# Patient Record
Sex: Male | Born: 1965 | Race: White | Hispanic: No | Smoking: Current every day smoker
Health system: Southern US, Community
[De-identification: ages and names within clinical notes are randomized; demographics above are authoritative.]

## PROBLEM LIST (undated history)

## (undated) DIAGNOSIS — J449 Chronic obstructive pulmonary disease, unspecified: Secondary | ICD-10-CM

## (undated) DIAGNOSIS — M069 Rheumatoid arthritis, unspecified: Secondary | ICD-10-CM

## (undated) DIAGNOSIS — I252 Old myocardial infarction: Secondary | ICD-10-CM

## (undated) DIAGNOSIS — D649 Anemia, unspecified: Secondary | ICD-10-CM

## (undated) DIAGNOSIS — F329 Major depressive disorder, single episode, unspecified: Secondary | ICD-10-CM

## (undated) DIAGNOSIS — E785 Hyperlipidemia, unspecified: Secondary | ICD-10-CM

## (undated) DIAGNOSIS — I10 Essential (primary) hypertension: Secondary | ICD-10-CM

## (undated) DIAGNOSIS — C3490 Malignant neoplasm of unspecified part of unspecified bronchus or lung: Secondary | ICD-10-CM

## (undated) DIAGNOSIS — Z85118 Personal history of other malignant neoplasm of bronchus and lung: Secondary | ICD-10-CM

## (undated) DIAGNOSIS — Z8673 Personal history of transient ischemic attack (TIA), and cerebral infarction without residual deficits: Secondary | ICD-10-CM

## (undated) DIAGNOSIS — F32A Depression, unspecified: Secondary | ICD-10-CM

## (undated) DIAGNOSIS — K5090 Crohn's disease, unspecified, without complications: Secondary | ICD-10-CM

## (undated) DIAGNOSIS — I639 Cerebral infarction, unspecified: Secondary | ICD-10-CM

## (undated) DIAGNOSIS — I110 Hypertensive heart disease with heart failure: Secondary | ICD-10-CM

## (undated) DIAGNOSIS — F99 Mental disorder, not otherwise specified: Secondary | ICD-10-CM

## (undated) DIAGNOSIS — G4730 Sleep apnea, unspecified: Secondary | ICD-10-CM

## (undated) DIAGNOSIS — F419 Anxiety disorder, unspecified: Secondary | ICD-10-CM

## (undated) DIAGNOSIS — R569 Unspecified convulsions: Secondary | ICD-10-CM

## (undated) DIAGNOSIS — K219 Gastro-esophageal reflux disease without esophagitis: Secondary | ICD-10-CM

## (undated) DIAGNOSIS — Z9981 Dependence on supplemental oxygen: Secondary | ICD-10-CM

## (undated) DIAGNOSIS — E291 Testicular hypofunction: Secondary | ICD-10-CM

## (undated) DIAGNOSIS — R413 Other amnesia: Secondary | ICD-10-CM

## (undated) DIAGNOSIS — G629 Polyneuropathy, unspecified: Secondary | ICD-10-CM

## (undated) DIAGNOSIS — N183 Chronic kidney disease, stage 3 unspecified: Secondary | ICD-10-CM

## (undated) HISTORY — DX: Other amnesia: R41.3

## (undated) HISTORY — PX: OTHER SURGICAL HISTORY: SHX169

## (undated) HISTORY — DX: Hyperlipidemia, unspecified: E78.5

## (undated) HISTORY — PX: EYE SURGERY: SHX253

## (undated) HISTORY — DX: Mental disorder, not otherwise specified: F99

## (undated) HISTORY — DX: Personal history of other malignant neoplasm of bronchus and lung: Z85.118

## (undated) HISTORY — DX: Anxiety disorder, unspecified: F41.9

## (undated) HISTORY — DX: Depression, unspecified: F32.A

## (undated) HISTORY — DX: Unspecified convulsions: R56.9

## (undated) HISTORY — DX: Personal history of transient ischemic attack (TIA), and cerebral infarction without residual deficits: Z86.73

## (undated) HISTORY — DX: Crohn's disease, unspecified, without complications: K50.90

## (undated) HISTORY — DX: Chronic kidney disease, stage 3 unspecified: N18.30

## (undated) HISTORY — DX: Testicular hypofunction: E29.1

## (undated) HISTORY — PX: APPENDECTOMY: SHX54

## (undated) HISTORY — DX: Chronic obstructive pulmonary disease, unspecified: J44.9

## (undated) HISTORY — DX: Major depressive disorder, single episode, unspecified: F32.9

## (undated) HISTORY — DX: Hypertensive heart disease with heart failure: I11.0

## (undated) HISTORY — DX: Malignant neoplasm of unspecified part of unspecified bronchus or lung: C34.90

## (undated) HISTORY — PX: PORT-A-CATH REMOVAL: SHX5289

## (undated) HISTORY — DX: Rheumatoid arthritis, unspecified: M06.9

## (undated) HISTORY — DX: Essential (primary) hypertension: I10

## (undated) HISTORY — DX: Anemia, unspecified: D64.9

## (undated) HISTORY — DX: Cerebral infarction, unspecified: I63.9

## (undated) HISTORY — DX: Dependence on supplemental oxygen: Z99.81

## (undated) HISTORY — DX: Polyneuropathy, unspecified: G62.9

## (undated) HISTORY — DX: Gastro-esophageal reflux disease without esophagitis: K21.9

---
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
## 2007-09-28 HISTORY — PX: ABDOMINAL SURGERY: SHX537

## 2008-08-11 DIAGNOSIS — K5090 Crohn's disease, unspecified, without complications: Secondary | ICD-10-CM | POA: Insufficient documentation

## 2011-09-28 HISTORY — PX: APPENDECTOMY: SHX54

## 2011-10-01 DIAGNOSIS — K5090 Crohn's disease, unspecified, without complications: Secondary | ICD-10-CM | POA: Diagnosis not present

## 2011-10-01 DIAGNOSIS — R05 Cough: Secondary | ICD-10-CM | POA: Diagnosis not present

## 2011-10-01 DIAGNOSIS — J4489 Other specified chronic obstructive pulmonary disease: Secondary | ICD-10-CM | POA: Diagnosis not present

## 2011-10-01 DIAGNOSIS — J449 Chronic obstructive pulmonary disease, unspecified: Secondary | ICD-10-CM | POA: Diagnosis not present

## 2011-10-01 DIAGNOSIS — Z79899 Other long term (current) drug therapy: Secondary | ICD-10-CM | POA: Diagnosis not present

## 2011-10-01 DIAGNOSIS — R0609 Other forms of dyspnea: Secondary | ICD-10-CM | POA: Diagnosis not present

## 2011-10-01 DIAGNOSIS — R059 Cough, unspecified: Secondary | ICD-10-CM | POA: Diagnosis not present

## 2011-10-01 DIAGNOSIS — G4730 Sleep apnea, unspecified: Secondary | ICD-10-CM | POA: Diagnosis not present

## 2011-10-01 DIAGNOSIS — G839 Paralytic syndrome, unspecified: Secondary | ICD-10-CM | POA: Diagnosis not present

## 2011-10-01 DIAGNOSIS — Z23 Encounter for immunization: Secondary | ICD-10-CM | POA: Diagnosis not present

## 2011-10-01 DIAGNOSIS — Z87891 Personal history of nicotine dependence: Secondary | ICD-10-CM | POA: Diagnosis not present

## 2011-11-08 DIAGNOSIS — G8929 Other chronic pain: Secondary | ICD-10-CM | POA: Diagnosis not present

## 2011-11-08 DIAGNOSIS — G894 Chronic pain syndrome: Secondary | ICD-10-CM | POA: Diagnosis not present

## 2011-11-08 DIAGNOSIS — M545 Low back pain, unspecified: Secondary | ICD-10-CM | POA: Diagnosis not present

## 2011-11-08 DIAGNOSIS — Z79899 Other long term (current) drug therapy: Secondary | ICD-10-CM | POA: Diagnosis not present

## 2011-11-08 DIAGNOSIS — K5090 Crohn's disease, unspecified, without complications: Secondary | ICD-10-CM | POA: Diagnosis not present

## 2011-12-28 DIAGNOSIS — K5090 Crohn's disease, unspecified, without complications: Secondary | ICD-10-CM | POA: Diagnosis not present

## 2011-12-28 DIAGNOSIS — K5080 Crohn's disease of both small and large intestine without complications: Secondary | ICD-10-CM | POA: Diagnosis not present

## 2011-12-28 DIAGNOSIS — Z79899 Other long term (current) drug therapy: Secondary | ICD-10-CM | POA: Diagnosis not present

## 2011-12-30 DIAGNOSIS — K5090 Crohn's disease, unspecified, without complications: Secondary | ICD-10-CM | POA: Diagnosis not present

## 2012-01-04 DIAGNOSIS — J309 Allergic rhinitis, unspecified: Secondary | ICD-10-CM | POA: Diagnosis not present

## 2012-01-04 DIAGNOSIS — K5090 Crohn's disease, unspecified, without complications: Secondary | ICD-10-CM | POA: Diagnosis not present

## 2012-01-04 DIAGNOSIS — R109 Unspecified abdominal pain: Secondary | ICD-10-CM | POA: Diagnosis not present

## 2012-01-14 DIAGNOSIS — F332 Major depressive disorder, recurrent severe without psychotic features: Secondary | ICD-10-CM | POA: Diagnosis not present

## 2012-01-20 DIAGNOSIS — R05 Cough: Secondary | ICD-10-CM | POA: Diagnosis not present

## 2012-01-20 DIAGNOSIS — F329 Major depressive disorder, single episode, unspecified: Secondary | ICD-10-CM | POA: Diagnosis not present

## 2012-01-20 DIAGNOSIS — Z79899 Other long term (current) drug therapy: Secondary | ICD-10-CM | POA: Diagnosis not present

## 2012-01-20 DIAGNOSIS — R630 Anorexia: Secondary | ICD-10-CM | POA: Diagnosis not present

## 2012-01-20 DIAGNOSIS — J449 Chronic obstructive pulmonary disease, unspecified: Secondary | ICD-10-CM | POA: Diagnosis not present

## 2012-01-20 DIAGNOSIS — N189 Chronic kidney disease, unspecified: Secondary | ICD-10-CM | POA: Diagnosis not present

## 2012-01-20 DIAGNOSIS — K5090 Crohn's disease, unspecified, without complications: Secondary | ICD-10-CM | POA: Diagnosis not present

## 2012-01-20 DIAGNOSIS — R911 Solitary pulmonary nodule: Secondary | ICD-10-CM | POA: Diagnosis not present

## 2012-01-20 DIAGNOSIS — R059 Cough, unspecified: Secondary | ICD-10-CM | POA: Diagnosis not present

## 2012-01-20 DIAGNOSIS — R5381 Other malaise: Secondary | ICD-10-CM | POA: Diagnosis not present

## 2012-01-20 DIAGNOSIS — R079 Chest pain, unspecified: Secondary | ICD-10-CM | POA: Diagnosis not present

## 2012-01-20 DIAGNOSIS — J3489 Other specified disorders of nose and nasal sinuses: Secondary | ICD-10-CM | POA: Diagnosis not present

## 2012-01-20 DIAGNOSIS — J441 Chronic obstructive pulmonary disease with (acute) exacerbation: Secondary | ICD-10-CM | POA: Diagnosis not present

## 2012-02-07 DIAGNOSIS — N2889 Other specified disorders of kidney and ureter: Secondary | ICD-10-CM | POA: Diagnosis not present

## 2012-02-07 DIAGNOSIS — R911 Solitary pulmonary nodule: Secondary | ICD-10-CM | POA: Diagnosis not present

## 2012-02-07 DIAGNOSIS — Z79899 Other long term (current) drug therapy: Secondary | ICD-10-CM | POA: Diagnosis not present

## 2012-02-07 DIAGNOSIS — G894 Chronic pain syndrome: Secondary | ICD-10-CM | POA: Diagnosis not present

## 2012-02-07 DIAGNOSIS — G4733 Obstructive sleep apnea (adult) (pediatric): Secondary | ICD-10-CM | POA: Diagnosis not present

## 2012-02-07 DIAGNOSIS — F17200 Nicotine dependence, unspecified, uncomplicated: Secondary | ICD-10-CM | POA: Diagnosis not present

## 2012-02-07 DIAGNOSIS — M533 Sacrococcygeal disorders, not elsewhere classified: Secondary | ICD-10-CM | POA: Diagnosis not present

## 2012-02-07 DIAGNOSIS — J449 Chronic obstructive pulmonary disease, unspecified: Secondary | ICD-10-CM | POA: Diagnosis not present

## 2012-02-07 DIAGNOSIS — R109 Unspecified abdominal pain: Secondary | ICD-10-CM | POA: Diagnosis not present

## 2012-02-07 DIAGNOSIS — K5090 Crohn's disease, unspecified, without complications: Secondary | ICD-10-CM | POA: Diagnosis not present

## 2012-02-07 DIAGNOSIS — R141 Gas pain: Secondary | ICD-10-CM | POA: Diagnosis not present

## 2012-02-07 DIAGNOSIS — G8929 Other chronic pain: Secondary | ICD-10-CM | POA: Diagnosis not present

## 2012-02-07 DIAGNOSIS — M545 Low back pain, unspecified: Secondary | ICD-10-CM | POA: Diagnosis not present

## 2012-02-07 DIAGNOSIS — R599 Enlarged lymph nodes, unspecified: Secondary | ICD-10-CM | POA: Diagnosis not present

## 2012-02-16 DIAGNOSIS — F332 Major depressive disorder, recurrent severe without psychotic features: Secondary | ICD-10-CM | POA: Diagnosis not present

## 2012-02-16 DIAGNOSIS — M533 Sacrococcygeal disorders, not elsewhere classified: Secondary | ICD-10-CM | POA: Diagnosis not present

## 2012-02-24 DIAGNOSIS — Z79899 Other long term (current) drug therapy: Secondary | ICD-10-CM | POA: Diagnosis not present

## 2012-02-24 DIAGNOSIS — K5090 Crohn's disease, unspecified, without complications: Secondary | ICD-10-CM | POA: Diagnosis not present

## 2012-02-24 DIAGNOSIS — R109 Unspecified abdominal pain: Secondary | ICD-10-CM | POA: Diagnosis not present

## 2012-02-24 DIAGNOSIS — G8929 Other chronic pain: Secondary | ICD-10-CM | POA: Diagnosis not present

## 2012-03-01 DIAGNOSIS — F329 Major depressive disorder, single episode, unspecified: Secondary | ICD-10-CM | POA: Diagnosis not present

## 2012-03-01 DIAGNOSIS — M79609 Pain in unspecified limb: Secondary | ICD-10-CM | POA: Diagnosis not present

## 2012-03-01 DIAGNOSIS — M549 Dorsalgia, unspecified: Secondary | ICD-10-CM | POA: Diagnosis not present

## 2012-03-01 DIAGNOSIS — J449 Chronic obstructive pulmonary disease, unspecified: Secondary | ICD-10-CM | POA: Diagnosis not present

## 2012-03-10 DIAGNOSIS — J4489 Other specified chronic obstructive pulmonary disease: Secondary | ICD-10-CM | POA: Diagnosis not present

## 2012-03-10 DIAGNOSIS — R911 Solitary pulmonary nodule: Secondary | ICD-10-CM | POA: Diagnosis not present

## 2012-03-10 DIAGNOSIS — Z79899 Other long term (current) drug therapy: Secondary | ICD-10-CM | POA: Diagnosis not present

## 2012-03-10 DIAGNOSIS — J984 Other disorders of lung: Secondary | ICD-10-CM | POA: Diagnosis not present

## 2012-03-10 DIAGNOSIS — F332 Major depressive disorder, recurrent severe without psychotic features: Secondary | ICD-10-CM | POA: Diagnosis not present

## 2012-03-10 DIAGNOSIS — J449 Chronic obstructive pulmonary disease, unspecified: Secondary | ICD-10-CM | POA: Diagnosis not present

## 2012-03-10 DIAGNOSIS — R0989 Other specified symptoms and signs involving the circulatory and respiratory systems: Secondary | ICD-10-CM | POA: Diagnosis not present

## 2012-03-10 DIAGNOSIS — R0609 Other forms of dyspnea: Secondary | ICD-10-CM | POA: Diagnosis not present

## 2012-03-10 DIAGNOSIS — R599 Enlarged lymph nodes, unspecified: Secondary | ICD-10-CM | POA: Diagnosis not present

## 2012-03-10 DIAGNOSIS — Z5181 Encounter for therapeutic drug level monitoring: Secondary | ICD-10-CM | POA: Diagnosis not present

## 2012-03-10 DIAGNOSIS — G4730 Sleep apnea, unspecified: Secondary | ICD-10-CM | POA: Diagnosis not present

## 2012-03-14 DIAGNOSIS — J449 Chronic obstructive pulmonary disease, unspecified: Secondary | ICD-10-CM | POA: Diagnosis not present

## 2012-03-14 DIAGNOSIS — L02219 Cutaneous abscess of trunk, unspecified: Secondary | ICD-10-CM | POA: Diagnosis not present

## 2012-03-14 DIAGNOSIS — IMO0002 Reserved for concepts with insufficient information to code with codable children: Secondary | ICD-10-CM | POA: Diagnosis not present

## 2012-03-14 DIAGNOSIS — K219 Gastro-esophageal reflux disease without esophagitis: Secondary | ICD-10-CM | POA: Diagnosis not present

## 2012-03-14 DIAGNOSIS — R071 Chest pain on breathing: Secondary | ICD-10-CM | POA: Diagnosis not present

## 2012-04-03 DIAGNOSIS — R599 Enlarged lymph nodes, unspecified: Secondary | ICD-10-CM | POA: Diagnosis not present

## 2012-04-03 DIAGNOSIS — J449 Chronic obstructive pulmonary disease, unspecified: Secondary | ICD-10-CM | POA: Diagnosis not present

## 2012-04-03 DIAGNOSIS — K5090 Crohn's disease, unspecified, without complications: Secondary | ICD-10-CM | POA: Diagnosis not present

## 2012-04-03 DIAGNOSIS — Z79899 Other long term (current) drug therapy: Secondary | ICD-10-CM | POA: Diagnosis not present

## 2012-04-03 DIAGNOSIS — R911 Solitary pulmonary nodule: Secondary | ICD-10-CM | POA: Diagnosis not present

## 2012-04-05 DIAGNOSIS — J449 Chronic obstructive pulmonary disease, unspecified: Secondary | ICD-10-CM | POA: Diagnosis not present

## 2012-04-05 DIAGNOSIS — Z79899 Other long term (current) drug therapy: Secondary | ICD-10-CM | POA: Diagnosis not present

## 2012-04-06 DIAGNOSIS — N189 Chronic kidney disease, unspecified: Secondary | ICD-10-CM | POA: Diagnosis not present

## 2012-04-06 DIAGNOSIS — C771 Secondary and unspecified malignant neoplasm of intrathoracic lymph nodes: Secondary | ICD-10-CM | POA: Diagnosis not present

## 2012-04-06 DIAGNOSIS — Z87891 Personal history of nicotine dependence: Secondary | ICD-10-CM | POA: Diagnosis not present

## 2012-04-06 DIAGNOSIS — G4700 Insomnia, unspecified: Secondary | ICD-10-CM | POA: Diagnosis not present

## 2012-04-06 DIAGNOSIS — M1990 Unspecified osteoarthritis, unspecified site: Secondary | ICD-10-CM | POA: Diagnosis not present

## 2012-04-06 DIAGNOSIS — F329 Major depressive disorder, single episode, unspecified: Secondary | ICD-10-CM | POA: Diagnosis not present

## 2012-04-06 DIAGNOSIS — K5090 Crohn's disease, unspecified, without complications: Secondary | ICD-10-CM | POA: Diagnosis not present

## 2012-04-06 DIAGNOSIS — R599 Enlarged lymph nodes, unspecified: Secondary | ICD-10-CM | POA: Diagnosis not present

## 2012-04-06 DIAGNOSIS — J449 Chronic obstructive pulmonary disease, unspecified: Secondary | ICD-10-CM | POA: Diagnosis not present

## 2012-04-06 DIAGNOSIS — C801 Malignant (primary) neoplasm, unspecified: Secondary | ICD-10-CM | POA: Diagnosis not present

## 2012-04-21 DIAGNOSIS — R42 Dizziness and giddiness: Secondary | ICD-10-CM | POA: Diagnosis not present

## 2012-04-21 DIAGNOSIS — I10 Essential (primary) hypertension: Secondary | ICD-10-CM | POA: Diagnosis not present

## 2012-04-21 DIAGNOSIS — C3490 Malignant neoplasm of unspecified part of unspecified bronchus or lung: Secondary | ICD-10-CM | POA: Diagnosis not present

## 2012-04-21 DIAGNOSIS — F411 Generalized anxiety disorder: Secondary | ICD-10-CM | POA: Diagnosis not present

## 2012-04-21 DIAGNOSIS — F329 Major depressive disorder, single episode, unspecified: Secondary | ICD-10-CM | POA: Diagnosis not present

## 2012-04-21 DIAGNOSIS — J449 Chronic obstructive pulmonary disease, unspecified: Secondary | ICD-10-CM | POA: Diagnosis not present

## 2012-04-21 DIAGNOSIS — K5080 Crohn's disease of both small and large intestine without complications: Secondary | ICD-10-CM | POA: Diagnosis not present

## 2012-04-21 DIAGNOSIS — Z79899 Other long term (current) drug therapy: Secondary | ICD-10-CM | POA: Diagnosis not present

## 2012-05-08 DIAGNOSIS — F411 Generalized anxiety disorder: Secondary | ICD-10-CM | POA: Diagnosis not present

## 2012-05-08 DIAGNOSIS — Z87442 Personal history of urinary calculi: Secondary | ICD-10-CM | POA: Diagnosis not present

## 2012-05-08 DIAGNOSIS — M545 Low back pain, unspecified: Secondary | ICD-10-CM | POA: Diagnosis not present

## 2012-05-08 DIAGNOSIS — F17200 Nicotine dependence, unspecified, uncomplicated: Secondary | ICD-10-CM | POA: Diagnosis not present

## 2012-05-08 DIAGNOSIS — G8929 Other chronic pain: Secondary | ICD-10-CM | POA: Diagnosis not present

## 2012-05-08 DIAGNOSIS — R109 Unspecified abdominal pain: Secondary | ICD-10-CM | POA: Diagnosis not present

## 2012-05-08 DIAGNOSIS — Z79899 Other long term (current) drug therapy: Secondary | ICD-10-CM | POA: Diagnosis not present

## 2012-05-08 DIAGNOSIS — C3490 Malignant neoplasm of unspecified part of unspecified bronchus or lung: Secondary | ICD-10-CM | POA: Diagnosis not present

## 2012-05-08 DIAGNOSIS — Z8701 Personal history of pneumonia (recurrent): Secondary | ICD-10-CM | POA: Diagnosis not present

## 2012-05-08 DIAGNOSIS — Z91041 Radiographic dye allergy status: Secondary | ICD-10-CM | POA: Diagnosis not present

## 2012-05-26 DIAGNOSIS — C3490 Malignant neoplasm of unspecified part of unspecified bronchus or lung: Secondary | ICD-10-CM | POA: Diagnosis not present

## 2012-05-26 DIAGNOSIS — G930 Cerebral cysts: Secondary | ICD-10-CM | POA: Diagnosis not present

## 2012-05-31 DIAGNOSIS — Z0389 Encounter for observation for other suspected diseases and conditions ruled out: Secondary | ICD-10-CM | POA: Diagnosis not present

## 2012-05-31 DIAGNOSIS — R000 Tachycardia, unspecified: Secondary | ICD-10-CM | POA: Diagnosis not present

## 2012-06-09 DIAGNOSIS — F411 Generalized anxiety disorder: Secondary | ICD-10-CM | POA: Diagnosis not present

## 2012-06-09 DIAGNOSIS — C3490 Malignant neoplasm of unspecified part of unspecified bronchus or lung: Secondary | ICD-10-CM | POA: Diagnosis not present

## 2012-06-09 DIAGNOSIS — G8929 Other chronic pain: Secondary | ICD-10-CM | POA: Diagnosis not present

## 2012-06-09 DIAGNOSIS — M549 Dorsalgia, unspecified: Secondary | ICD-10-CM | POA: Diagnosis not present

## 2012-06-09 DIAGNOSIS — Z91041 Radiographic dye allergy status: Secondary | ICD-10-CM | POA: Diagnosis not present

## 2012-06-09 DIAGNOSIS — K5090 Crohn's disease, unspecified, without complications: Secondary | ICD-10-CM | POA: Diagnosis not present

## 2012-06-09 DIAGNOSIS — J449 Chronic obstructive pulmonary disease, unspecified: Secondary | ICD-10-CM | POA: Diagnosis not present

## 2012-06-09 DIAGNOSIS — Z79899 Other long term (current) drug therapy: Secondary | ICD-10-CM | POA: Diagnosis not present

## 2012-06-09 DIAGNOSIS — I10 Essential (primary) hypertension: Secondary | ICD-10-CM | POA: Diagnosis not present

## 2012-06-14 DIAGNOSIS — C3490 Malignant neoplasm of unspecified part of unspecified bronchus or lung: Secondary | ICD-10-CM | POA: Diagnosis not present

## 2012-06-14 DIAGNOSIS — K5090 Crohn's disease, unspecified, without complications: Secondary | ICD-10-CM | POA: Diagnosis not present

## 2012-06-14 DIAGNOSIS — R161 Splenomegaly, not elsewhere classified: Secondary | ICD-10-CM | POA: Diagnosis not present

## 2012-06-16 DIAGNOSIS — F411 Generalized anxiety disorder: Secondary | ICD-10-CM | POA: Diagnosis not present

## 2012-06-16 DIAGNOSIS — K5090 Crohn's disease, unspecified, without complications: Secondary | ICD-10-CM | POA: Diagnosis not present

## 2012-06-16 DIAGNOSIS — C3490 Malignant neoplasm of unspecified part of unspecified bronchus or lung: Secondary | ICD-10-CM | POA: Diagnosis not present

## 2012-06-16 DIAGNOSIS — R599 Enlarged lymph nodes, unspecified: Secondary | ICD-10-CM | POA: Diagnosis not present

## 2012-06-16 DIAGNOSIS — Z79899 Other long term (current) drug therapy: Secondary | ICD-10-CM | POA: Diagnosis not present

## 2012-06-16 DIAGNOSIS — R52 Pain, unspecified: Secondary | ICD-10-CM | POA: Diagnosis not present

## 2012-06-28 DIAGNOSIS — C3410 Malignant neoplasm of upper lobe, unspecified bronchus or lung: Secondary | ICD-10-CM | POA: Diagnosis not present

## 2012-06-28 DIAGNOSIS — K05219 Aggressive periodontitis, localized, unspecified severity: Secondary | ICD-10-CM | POA: Diagnosis not present

## 2012-06-28 DIAGNOSIS — K029 Dental caries, unspecified: Secondary | ICD-10-CM | POA: Diagnosis not present

## 2012-06-28 DIAGNOSIS — K047 Periapical abscess without sinus: Secondary | ICD-10-CM | POA: Diagnosis not present

## 2012-06-28 DIAGNOSIS — K5090 Crohn's disease, unspecified, without complications: Secondary | ICD-10-CM | POA: Diagnosis not present

## 2012-06-28 DIAGNOSIS — M545 Low back pain, unspecified: Secondary | ICD-10-CM | POA: Diagnosis not present

## 2012-06-28 DIAGNOSIS — F17200 Nicotine dependence, unspecified, uncomplicated: Secondary | ICD-10-CM | POA: Diagnosis not present

## 2012-06-28 DIAGNOSIS — G8929 Other chronic pain: Secondary | ICD-10-CM | POA: Diagnosis not present

## 2012-06-28 DIAGNOSIS — G4733 Obstructive sleep apnea (adult) (pediatric): Secondary | ICD-10-CM | POA: Diagnosis not present

## 2012-06-28 DIAGNOSIS — J449 Chronic obstructive pulmonary disease, unspecified: Secondary | ICD-10-CM | POA: Diagnosis not present

## 2012-06-29 DIAGNOSIS — C3410 Malignant neoplasm of upper lobe, unspecified bronchus or lung: Secondary | ICD-10-CM | POA: Diagnosis not present

## 2012-06-29 DIAGNOSIS — G8929 Other chronic pain: Secondary | ICD-10-CM | POA: Diagnosis not present

## 2012-06-29 DIAGNOSIS — K5090 Crohn's disease, unspecified, without complications: Secondary | ICD-10-CM | POA: Diagnosis not present

## 2012-06-29 DIAGNOSIS — R599 Enlarged lymph nodes, unspecified: Secondary | ICD-10-CM | POA: Diagnosis not present

## 2012-06-30 DIAGNOSIS — Z510 Encounter for antineoplastic radiation therapy: Secondary | ICD-10-CM | POA: Diagnosis not present

## 2012-06-30 DIAGNOSIS — C3410 Malignant neoplasm of upper lobe, unspecified bronchus or lung: Secondary | ICD-10-CM | POA: Diagnosis not present

## 2012-07-05 DIAGNOSIS — C781 Secondary malignant neoplasm of mediastinum: Secondary | ICD-10-CM | POA: Diagnosis not present

## 2012-07-05 DIAGNOSIS — Z510 Encounter for antineoplastic radiation therapy: Secondary | ICD-10-CM | POA: Diagnosis not present

## 2012-07-05 DIAGNOSIS — C3410 Malignant neoplasm of upper lobe, unspecified bronchus or lung: Secondary | ICD-10-CM | POA: Diagnosis not present

## 2012-07-06 DIAGNOSIS — C3490 Malignant neoplasm of unspecified part of unspecified bronchus or lung: Secondary | ICD-10-CM | POA: Diagnosis not present

## 2012-07-06 DIAGNOSIS — I999 Unspecified disorder of circulatory system: Secondary | ICD-10-CM | POA: Diagnosis not present

## 2012-07-06 DIAGNOSIS — K5090 Crohn's disease, unspecified, without complications: Secondary | ICD-10-CM | POA: Diagnosis not present

## 2012-07-07 DIAGNOSIS — Z79899 Other long term (current) drug therapy: Secondary | ICD-10-CM | POA: Diagnosis not present

## 2012-07-07 DIAGNOSIS — C3490 Malignant neoplasm of unspecified part of unspecified bronchus or lung: Secondary | ICD-10-CM | POA: Diagnosis not present

## 2012-07-07 DIAGNOSIS — F17200 Nicotine dependence, unspecified, uncomplicated: Secondary | ICD-10-CM | POA: Diagnosis not present

## 2012-07-07 DIAGNOSIS — I999 Unspecified disorder of circulatory system: Secondary | ICD-10-CM | POA: Diagnosis not present

## 2012-07-07 DIAGNOSIS — J449 Chronic obstructive pulmonary disease, unspecified: Secondary | ICD-10-CM | POA: Diagnosis not present

## 2012-07-07 DIAGNOSIS — C3410 Malignant neoplasm of upper lobe, unspecified bronchus or lung: Secondary | ICD-10-CM | POA: Diagnosis not present

## 2012-07-10 DIAGNOSIS — C3410 Malignant neoplasm of upper lobe, unspecified bronchus or lung: Secondary | ICD-10-CM | POA: Diagnosis not present

## 2012-07-10 DIAGNOSIS — Z510 Encounter for antineoplastic radiation therapy: Secondary | ICD-10-CM | POA: Diagnosis not present

## 2012-07-10 DIAGNOSIS — C781 Secondary malignant neoplasm of mediastinum: Secondary | ICD-10-CM | POA: Diagnosis not present

## 2012-07-11 DIAGNOSIS — Z510 Encounter for antineoplastic radiation therapy: Secondary | ICD-10-CM | POA: Diagnosis not present

## 2012-07-11 DIAGNOSIS — C781 Secondary malignant neoplasm of mediastinum: Secondary | ICD-10-CM | POA: Diagnosis not present

## 2012-07-11 DIAGNOSIS — Z5111 Encounter for antineoplastic chemotherapy: Secondary | ICD-10-CM | POA: Diagnosis not present

## 2012-07-11 DIAGNOSIS — C3410 Malignant neoplasm of upper lobe, unspecified bronchus or lung: Secondary | ICD-10-CM | POA: Diagnosis not present

## 2012-07-12 DIAGNOSIS — C3410 Malignant neoplasm of upper lobe, unspecified bronchus or lung: Secondary | ICD-10-CM | POA: Diagnosis not present

## 2012-07-12 DIAGNOSIS — C781 Secondary malignant neoplasm of mediastinum: Secondary | ICD-10-CM | POA: Diagnosis not present

## 2012-07-12 DIAGNOSIS — Z510 Encounter for antineoplastic radiation therapy: Secondary | ICD-10-CM | POA: Diagnosis not present

## 2012-07-13 DIAGNOSIS — C781 Secondary malignant neoplasm of mediastinum: Secondary | ICD-10-CM | POA: Diagnosis not present

## 2012-07-13 DIAGNOSIS — C3410 Malignant neoplasm of upper lobe, unspecified bronchus or lung: Secondary | ICD-10-CM | POA: Diagnosis not present

## 2012-07-13 DIAGNOSIS — Z510 Encounter for antineoplastic radiation therapy: Secondary | ICD-10-CM | POA: Diagnosis not present

## 2012-07-14 DIAGNOSIS — Z510 Encounter for antineoplastic radiation therapy: Secondary | ICD-10-CM | POA: Diagnosis not present

## 2012-07-14 DIAGNOSIS — C3410 Malignant neoplasm of upper lobe, unspecified bronchus or lung: Secondary | ICD-10-CM | POA: Diagnosis not present

## 2012-07-14 DIAGNOSIS — C781 Secondary malignant neoplasm of mediastinum: Secondary | ICD-10-CM | POA: Diagnosis not present

## 2012-07-18 DIAGNOSIS — C3410 Malignant neoplasm of upper lobe, unspecified bronchus or lung: Secondary | ICD-10-CM | POA: Diagnosis not present

## 2012-07-18 DIAGNOSIS — Z5111 Encounter for antineoplastic chemotherapy: Secondary | ICD-10-CM | POA: Diagnosis not present

## 2012-07-18 DIAGNOSIS — Z510 Encounter for antineoplastic radiation therapy: Secondary | ICD-10-CM | POA: Diagnosis not present

## 2012-07-19 DIAGNOSIS — Z510 Encounter for antineoplastic radiation therapy: Secondary | ICD-10-CM | POA: Diagnosis not present

## 2012-07-19 DIAGNOSIS — C781 Secondary malignant neoplasm of mediastinum: Secondary | ICD-10-CM | POA: Diagnosis not present

## 2012-07-19 DIAGNOSIS — C3410 Malignant neoplasm of upper lobe, unspecified bronchus or lung: Secondary | ICD-10-CM | POA: Diagnosis not present

## 2012-07-20 DIAGNOSIS — C781 Secondary malignant neoplasm of mediastinum: Secondary | ICD-10-CM | POA: Diagnosis not present

## 2012-07-20 DIAGNOSIS — Z510 Encounter for antineoplastic radiation therapy: Secondary | ICD-10-CM | POA: Diagnosis not present

## 2012-07-20 DIAGNOSIS — C3410 Malignant neoplasm of upper lobe, unspecified bronchus or lung: Secondary | ICD-10-CM | POA: Diagnosis not present

## 2012-07-21 DIAGNOSIS — C3410 Malignant neoplasm of upper lobe, unspecified bronchus or lung: Secondary | ICD-10-CM | POA: Diagnosis not present

## 2012-07-21 DIAGNOSIS — C781 Secondary malignant neoplasm of mediastinum: Secondary | ICD-10-CM | POA: Diagnosis not present

## 2012-07-21 DIAGNOSIS — Z510 Encounter for antineoplastic radiation therapy: Secondary | ICD-10-CM | POA: Diagnosis not present

## 2012-07-24 DIAGNOSIS — Z510 Encounter for antineoplastic radiation therapy: Secondary | ICD-10-CM | POA: Diagnosis not present

## 2012-07-24 DIAGNOSIS — C3410 Malignant neoplasm of upper lobe, unspecified bronchus or lung: Secondary | ICD-10-CM | POA: Diagnosis not present

## 2012-07-25 DIAGNOSIS — C3410 Malignant neoplasm of upper lobe, unspecified bronchus or lung: Secondary | ICD-10-CM | POA: Diagnosis not present

## 2012-07-25 DIAGNOSIS — Z510 Encounter for antineoplastic radiation therapy: Secondary | ICD-10-CM | POA: Diagnosis not present

## 2012-07-25 DIAGNOSIS — Z5111 Encounter for antineoplastic chemotherapy: Secondary | ICD-10-CM | POA: Diagnosis not present

## 2012-07-26 DIAGNOSIS — C3410 Malignant neoplasm of upper lobe, unspecified bronchus or lung: Secondary | ICD-10-CM | POA: Diagnosis not present

## 2012-07-27 DIAGNOSIS — C3410 Malignant neoplasm of upper lobe, unspecified bronchus or lung: Secondary | ICD-10-CM | POA: Diagnosis not present

## 2012-07-28 DIAGNOSIS — C3410 Malignant neoplasm of upper lobe, unspecified bronchus or lung: Secondary | ICD-10-CM | POA: Diagnosis not present

## 2012-07-31 DIAGNOSIS — C3410 Malignant neoplasm of upper lobe, unspecified bronchus or lung: Secondary | ICD-10-CM | POA: Diagnosis not present

## 2012-07-31 DIAGNOSIS — Z510 Encounter for antineoplastic radiation therapy: Secondary | ICD-10-CM | POA: Diagnosis not present

## 2012-08-01 DIAGNOSIS — C3410 Malignant neoplasm of upper lobe, unspecified bronchus or lung: Secondary | ICD-10-CM | POA: Diagnosis not present

## 2012-08-01 DIAGNOSIS — Z5111 Encounter for antineoplastic chemotherapy: Secondary | ICD-10-CM | POA: Diagnosis not present

## 2012-08-01 DIAGNOSIS — Z510 Encounter for antineoplastic radiation therapy: Secondary | ICD-10-CM | POA: Diagnosis not present

## 2012-08-02 DIAGNOSIS — C3410 Malignant neoplasm of upper lobe, unspecified bronchus or lung: Secondary | ICD-10-CM | POA: Diagnosis not present

## 2012-08-02 DIAGNOSIS — Z510 Encounter for antineoplastic radiation therapy: Secondary | ICD-10-CM | POA: Diagnosis not present

## 2012-08-03 DIAGNOSIS — C3410 Malignant neoplasm of upper lobe, unspecified bronchus or lung: Secondary | ICD-10-CM | POA: Diagnosis not present

## 2012-08-03 DIAGNOSIS — Z510 Encounter for antineoplastic radiation therapy: Secondary | ICD-10-CM | POA: Diagnosis not present

## 2012-08-05 DIAGNOSIS — J449 Chronic obstructive pulmonary disease, unspecified: Secondary | ICD-10-CM | POA: Diagnosis not present

## 2012-08-05 DIAGNOSIS — G893 Neoplasm related pain (acute) (chronic): Secondary | ICD-10-CM | POA: Diagnosis not present

## 2012-08-05 DIAGNOSIS — D491 Neoplasm of unspecified behavior of respiratory system: Secondary | ICD-10-CM | POA: Diagnosis not present

## 2012-08-05 DIAGNOSIS — R5381 Other malaise: Secondary | ICD-10-CM | POA: Diagnosis not present

## 2012-08-05 DIAGNOSIS — C3490 Malignant neoplasm of unspecified part of unspecified bronchus or lung: Secondary | ICD-10-CM | POA: Diagnosis not present

## 2012-08-05 DIAGNOSIS — D649 Anemia, unspecified: Secondary | ICD-10-CM | POA: Diagnosis not present

## 2012-08-05 DIAGNOSIS — R52 Pain, unspecified: Secondary | ICD-10-CM | POA: Diagnosis not present

## 2012-08-05 DIAGNOSIS — K219 Gastro-esophageal reflux disease without esophagitis: Secondary | ICD-10-CM | POA: Diagnosis not present

## 2012-08-05 DIAGNOSIS — J40 Bronchitis, not specified as acute or chronic: Secondary | ICD-10-CM | POA: Diagnosis not present

## 2012-08-07 DIAGNOSIS — C3410 Malignant neoplasm of upper lobe, unspecified bronchus or lung: Secondary | ICD-10-CM | POA: Diagnosis not present

## 2012-08-07 DIAGNOSIS — Z510 Encounter for antineoplastic radiation therapy: Secondary | ICD-10-CM | POA: Diagnosis not present

## 2012-08-08 DIAGNOSIS — C3410 Malignant neoplasm of upper lobe, unspecified bronchus or lung: Secondary | ICD-10-CM | POA: Diagnosis not present

## 2012-08-09 DIAGNOSIS — C3410 Malignant neoplasm of upper lobe, unspecified bronchus or lung: Secondary | ICD-10-CM | POA: Diagnosis not present

## 2012-08-09 DIAGNOSIS — Z510 Encounter for antineoplastic radiation therapy: Secondary | ICD-10-CM | POA: Diagnosis not present

## 2012-08-10 DIAGNOSIS — C3410 Malignant neoplasm of upper lobe, unspecified bronchus or lung: Secondary | ICD-10-CM | POA: Diagnosis not present

## 2012-08-11 DIAGNOSIS — C3490 Malignant neoplasm of unspecified part of unspecified bronchus or lung: Secondary | ICD-10-CM | POA: Diagnosis not present

## 2012-08-11 DIAGNOSIS — R079 Chest pain, unspecified: Secondary | ICD-10-CM | POA: Diagnosis not present

## 2012-08-11 DIAGNOSIS — C3410 Malignant neoplasm of upper lobe, unspecified bronchus or lung: Secondary | ICD-10-CM | POA: Diagnosis not present

## 2012-08-11 DIAGNOSIS — M545 Low back pain, unspecified: Secondary | ICD-10-CM | POA: Diagnosis not present

## 2012-08-14 DIAGNOSIS — Z510 Encounter for antineoplastic radiation therapy: Secondary | ICD-10-CM | POA: Diagnosis not present

## 2012-08-14 DIAGNOSIS — C3410 Malignant neoplasm of upper lobe, unspecified bronchus or lung: Secondary | ICD-10-CM | POA: Diagnosis not present

## 2012-08-15 DIAGNOSIS — D649 Anemia, unspecified: Secondary | ICD-10-CM | POA: Diagnosis not present

## 2012-08-15 DIAGNOSIS — C3410 Malignant neoplasm of upper lobe, unspecified bronchus or lung: Secondary | ICD-10-CM | POA: Diagnosis not present

## 2012-08-16 DIAGNOSIS — C3410 Malignant neoplasm of upper lobe, unspecified bronchus or lung: Secondary | ICD-10-CM | POA: Diagnosis not present

## 2012-08-16 DIAGNOSIS — Z510 Encounter for antineoplastic radiation therapy: Secondary | ICD-10-CM | POA: Diagnosis not present

## 2012-08-18 DIAGNOSIS — C3410 Malignant neoplasm of upper lobe, unspecified bronchus or lung: Secondary | ICD-10-CM | POA: Diagnosis not present

## 2012-08-18 DIAGNOSIS — Z510 Encounter for antineoplastic radiation therapy: Secondary | ICD-10-CM | POA: Diagnosis not present

## 2012-08-22 DIAGNOSIS — C3410 Malignant neoplasm of upper lobe, unspecified bronchus or lung: Secondary | ICD-10-CM | POA: Diagnosis not present

## 2012-08-26 DIAGNOSIS — N390 Urinary tract infection, site not specified: Secondary | ICD-10-CM | POA: Diagnosis not present

## 2012-08-26 DIAGNOSIS — Z79899 Other long term (current) drug therapy: Secondary | ICD-10-CM | POA: Diagnosis not present

## 2012-08-26 DIAGNOSIS — M1990 Unspecified osteoarthritis, unspecified site: Secondary | ICD-10-CM | POA: Diagnosis present

## 2012-08-26 DIAGNOSIS — R5381 Other malaise: Secondary | ICD-10-CM | POA: Diagnosis not present

## 2012-08-26 DIAGNOSIS — E876 Hypokalemia: Secondary | ICD-10-CM | POA: Diagnosis not present

## 2012-08-26 DIAGNOSIS — C3490 Malignant neoplasm of unspecified part of unspecified bronchus or lung: Secondary | ICD-10-CM | POA: Diagnosis not present

## 2012-08-26 DIAGNOSIS — D6481 Anemia due to antineoplastic chemotherapy: Secondary | ICD-10-CM | POA: Diagnosis present

## 2012-08-26 DIAGNOSIS — G4733 Obstructive sleep apnea (adult) (pediatric): Secondary | ICD-10-CM | POA: Diagnosis not present

## 2012-08-26 DIAGNOSIS — J449 Chronic obstructive pulmonary disease, unspecified: Secondary | ICD-10-CM | POA: Diagnosis not present

## 2012-08-26 DIAGNOSIS — R509 Fever, unspecified: Secondary | ICD-10-CM | POA: Diagnosis not present

## 2012-08-26 DIAGNOSIS — Z87891 Personal history of nicotine dependence: Secondary | ICD-10-CM | POA: Diagnosis not present

## 2012-08-26 DIAGNOSIS — R5383 Other fatigue: Secondary | ICD-10-CM | POA: Diagnosis not present

## 2012-08-26 DIAGNOSIS — R0602 Shortness of breath: Secondary | ICD-10-CM | POA: Diagnosis not present

## 2012-08-26 DIAGNOSIS — J4489 Other specified chronic obstructive pulmonary disease: Secondary | ICD-10-CM | POA: Diagnosis not present

## 2012-08-26 DIAGNOSIS — D709 Neutropenia, unspecified: Secondary | ICD-10-CM | POA: Diagnosis not present

## 2012-08-26 DIAGNOSIS — D638 Anemia in other chronic diseases classified elsewhere: Secondary | ICD-10-CM | POA: Diagnosis not present

## 2012-08-26 DIAGNOSIS — D702 Other drug-induced agranulocytosis: Secondary | ICD-10-CM | POA: Diagnosis not present

## 2012-08-26 DIAGNOSIS — T451X5A Adverse effect of antineoplastic and immunosuppressive drugs, initial encounter: Secondary | ICD-10-CM | POA: Diagnosis present

## 2012-08-26 DIAGNOSIS — IMO0001 Reserved for inherently not codable concepts without codable children: Secondary | ICD-10-CM | POA: Diagnosis present

## 2012-08-26 DIAGNOSIS — D509 Iron deficiency anemia, unspecified: Secondary | ICD-10-CM | POA: Diagnosis not present

## 2012-08-26 DIAGNOSIS — R5081 Fever presenting with conditions classified elsewhere: Secondary | ICD-10-CM | POA: Diagnosis not present

## 2012-08-26 DIAGNOSIS — C3410 Malignant neoplasm of upper lobe, unspecified bronchus or lung: Secondary | ICD-10-CM | POA: Diagnosis not present

## 2012-08-29 DIAGNOSIS — C3410 Malignant neoplasm of upper lobe, unspecified bronchus or lung: Secondary | ICD-10-CM | POA: Diagnosis not present

## 2012-08-29 DIAGNOSIS — Z510 Encounter for antineoplastic radiation therapy: Secondary | ICD-10-CM | POA: Diagnosis not present

## 2012-08-30 DIAGNOSIS — C3410 Malignant neoplasm of upper lobe, unspecified bronchus or lung: Secondary | ICD-10-CM | POA: Diagnosis not present

## 2012-08-30 DIAGNOSIS — Z510 Encounter for antineoplastic radiation therapy: Secondary | ICD-10-CM | POA: Diagnosis not present

## 2012-08-31 DIAGNOSIS — C3410 Malignant neoplasm of upper lobe, unspecified bronchus or lung: Secondary | ICD-10-CM | POA: Diagnosis not present

## 2012-08-31 DIAGNOSIS — Z510 Encounter for antineoplastic radiation therapy: Secondary | ICD-10-CM | POA: Diagnosis not present

## 2012-09-01 DIAGNOSIS — C3410 Malignant neoplasm of upper lobe, unspecified bronchus or lung: Secondary | ICD-10-CM | POA: Diagnosis not present

## 2012-09-01 DIAGNOSIS — Z510 Encounter for antineoplastic radiation therapy: Secondary | ICD-10-CM | POA: Diagnosis not present

## 2012-09-04 DIAGNOSIS — Z510 Encounter for antineoplastic radiation therapy: Secondary | ICD-10-CM | POA: Diagnosis not present

## 2012-09-04 DIAGNOSIS — C3410 Malignant neoplasm of upper lobe, unspecified bronchus or lung: Secondary | ICD-10-CM | POA: Diagnosis not present

## 2012-09-05 DIAGNOSIS — C3410 Malignant neoplasm of upper lobe, unspecified bronchus or lung: Secondary | ICD-10-CM | POA: Diagnosis not present

## 2012-09-05 DIAGNOSIS — Z510 Encounter for antineoplastic radiation therapy: Secondary | ICD-10-CM | POA: Diagnosis not present

## 2012-09-06 DIAGNOSIS — Z510 Encounter for antineoplastic radiation therapy: Secondary | ICD-10-CM | POA: Diagnosis not present

## 2012-09-06 DIAGNOSIS — C3410 Malignant neoplasm of upper lobe, unspecified bronchus or lung: Secondary | ICD-10-CM | POA: Diagnosis not present

## 2012-09-12 DIAGNOSIS — Z79899 Other long term (current) drug therapy: Secondary | ICD-10-CM | POA: Diagnosis not present

## 2012-09-12 DIAGNOSIS — K5090 Crohn's disease, unspecified, without complications: Secondary | ICD-10-CM | POA: Diagnosis not present

## 2012-09-12 DIAGNOSIS — K5000 Crohn's disease of small intestine without complications: Secondary | ICD-10-CM | POA: Diagnosis not present

## 2012-09-12 DIAGNOSIS — J449 Chronic obstructive pulmonary disease, unspecified: Secondary | ICD-10-CM | POA: Diagnosis not present

## 2012-09-12 DIAGNOSIS — C3490 Malignant neoplasm of unspecified part of unspecified bronchus or lung: Secondary | ICD-10-CM | POA: Diagnosis not present

## 2012-09-14 DIAGNOSIS — C3410 Malignant neoplasm of upper lobe, unspecified bronchus or lung: Secondary | ICD-10-CM | POA: Diagnosis not present

## 2012-09-15 DIAGNOSIS — C3410 Malignant neoplasm of upper lobe, unspecified bronchus or lung: Secondary | ICD-10-CM | POA: Diagnosis not present

## 2012-10-05 DIAGNOSIS — D6481 Anemia due to antineoplastic chemotherapy: Secondary | ICD-10-CM | POA: Diagnosis not present

## 2012-10-05 DIAGNOSIS — C3410 Malignant neoplasm of upper lobe, unspecified bronchus or lung: Secondary | ICD-10-CM | POA: Diagnosis not present

## 2012-10-05 DIAGNOSIS — T451X5A Adverse effect of antineoplastic and immunosuppressive drugs, initial encounter: Secondary | ICD-10-CM | POA: Diagnosis not present

## 2012-10-06 DIAGNOSIS — D6481 Anemia due to antineoplastic chemotherapy: Secondary | ICD-10-CM | POA: Diagnosis not present

## 2012-10-13 DIAGNOSIS — C3410 Malignant neoplasm of upper lobe, unspecified bronchus or lung: Secondary | ICD-10-CM | POA: Diagnosis not present

## 2012-11-06 DIAGNOSIS — G8929 Other chronic pain: Secondary | ICD-10-CM | POA: Diagnosis not present

## 2012-11-06 DIAGNOSIS — C3490 Malignant neoplasm of unspecified part of unspecified bronchus or lung: Secondary | ICD-10-CM | POA: Diagnosis not present

## 2012-11-06 DIAGNOSIS — Z79899 Other long term (current) drug therapy: Secondary | ICD-10-CM | POA: Diagnosis not present

## 2012-11-06 DIAGNOSIS — N189 Chronic kidney disease, unspecified: Secondary | ICD-10-CM | POA: Diagnosis not present

## 2012-11-06 DIAGNOSIS — G893 Neoplasm related pain (acute) (chronic): Secondary | ICD-10-CM | POA: Diagnosis not present

## 2012-11-07 DIAGNOSIS — L02519 Cutaneous abscess of unspecified hand: Secondary | ICD-10-CM | POA: Diagnosis not present

## 2012-11-07 DIAGNOSIS — R059 Cough, unspecified: Secondary | ICD-10-CM | POA: Diagnosis not present

## 2012-11-07 DIAGNOSIS — D649 Anemia, unspecified: Secondary | ICD-10-CM | POA: Diagnosis not present

## 2012-11-07 DIAGNOSIS — R0789 Other chest pain: Secondary | ICD-10-CM | POA: Diagnosis not present

## 2012-11-07 DIAGNOSIS — L03119 Cellulitis of unspecified part of limb: Secondary | ICD-10-CM | POA: Diagnosis not present

## 2012-11-07 DIAGNOSIS — J449 Chronic obstructive pulmonary disease, unspecified: Secondary | ICD-10-CM | POA: Diagnosis not present

## 2012-11-07 DIAGNOSIS — R0602 Shortness of breath: Secondary | ICD-10-CM | POA: Diagnosis not present

## 2012-11-07 DIAGNOSIS — R5381 Other malaise: Secondary | ICD-10-CM | POA: Diagnosis not present

## 2012-11-07 DIAGNOSIS — C3490 Malignant neoplasm of unspecified part of unspecified bronchus or lung: Secondary | ICD-10-CM | POA: Diagnosis not present

## 2012-11-07 DIAGNOSIS — R05 Cough: Secondary | ICD-10-CM | POA: Diagnosis not present

## 2012-11-07 DIAGNOSIS — R5383 Other fatigue: Secondary | ICD-10-CM | POA: Diagnosis not present

## 2012-11-07 DIAGNOSIS — K219 Gastro-esophageal reflux disease without esophagitis: Secondary | ICD-10-CM | POA: Diagnosis not present

## 2012-11-07 DIAGNOSIS — R079 Chest pain, unspecified: Secondary | ICD-10-CM | POA: Diagnosis not present

## 2012-11-07 DIAGNOSIS — F17200 Nicotine dependence, unspecified, uncomplicated: Secondary | ICD-10-CM | POA: Diagnosis not present

## 2012-11-07 DIAGNOSIS — IMO0002 Reserved for concepts with insufficient information to code with codable children: Secondary | ICD-10-CM | POA: Diagnosis not present

## 2012-11-16 DIAGNOSIS — H2589 Other age-related cataract: Secondary | ICD-10-CM | POA: Diagnosis not present

## 2012-11-16 DIAGNOSIS — H43399 Other vitreous opacities, unspecified eye: Secondary | ICD-10-CM | POA: Diagnosis not present

## 2012-11-16 DIAGNOSIS — D3130 Benign neoplasm of unspecified choroid: Secondary | ICD-10-CM | POA: Diagnosis not present

## 2012-11-17 DIAGNOSIS — C3410 Malignant neoplasm of upper lobe, unspecified bronchus or lung: Secondary | ICD-10-CM | POA: Diagnosis not present

## 2012-12-09 DIAGNOSIS — H2510 Age-related nuclear cataract, unspecified eye: Secondary | ICD-10-CM | POA: Diagnosis not present

## 2012-12-19 DIAGNOSIS — J45909 Unspecified asthma, uncomplicated: Secondary | ICD-10-CM | POA: Diagnosis not present

## 2012-12-19 DIAGNOSIS — H2589 Other age-related cataract: Secondary | ICD-10-CM | POA: Diagnosis not present

## 2012-12-19 DIAGNOSIS — H259 Unspecified age-related cataract: Secondary | ICD-10-CM | POA: Diagnosis not present

## 2012-12-19 DIAGNOSIS — H2510 Age-related nuclear cataract, unspecified eye: Secondary | ICD-10-CM | POA: Diagnosis not present

## 2012-12-19 DIAGNOSIS — J449 Chronic obstructive pulmonary disease, unspecified: Secondary | ICD-10-CM | POA: Diagnosis not present

## 2012-12-19 DIAGNOSIS — H269 Unspecified cataract: Secondary | ICD-10-CM | POA: Diagnosis not present

## 2012-12-29 ENCOUNTER — Telehealth (HOSPITAL_COMMUNITY): Payer: Self-pay | Admitting: *Deleted

## 2012-12-30 ENCOUNTER — Inpatient Hospital Stay (HOSPITAL_COMMUNITY)
Admission: AD | Admit: 2012-12-30 | Discharge: 2013-01-01 | DRG: 881 | Disposition: A | Discharge status: Home / Self Care | Payer: Medicaid Other | Source: Other Acute Inpatient Hospital | Attending: Psychiatry | Admitting: Psychiatry

## 2012-12-30 ENCOUNTER — Encounter (HOSPITAL_COMMUNITY): Payer: Self-pay | Admitting: *Deleted

## 2012-12-30 DIAGNOSIS — F329 Major depressive disorder, single episode, unspecified: Principal | ICD-10-CM

## 2012-12-30 DIAGNOSIS — Z79899 Other long term (current) drug therapy: Secondary | ICD-10-CM

## 2012-12-30 DIAGNOSIS — C3490 Malignant neoplasm of unspecified part of unspecified bronchus or lung: Secondary | ICD-10-CM | POA: Diagnosis present

## 2012-12-30 DIAGNOSIS — R45850 Homicidal ideations: Secondary | ICD-10-CM

## 2012-12-30 DIAGNOSIS — K5090 Crohn's disease, unspecified, without complications: Secondary | ICD-10-CM | POA: Diagnosis present

## 2012-12-30 DIAGNOSIS — F3289 Other specified depressive episodes: Principal | ICD-10-CM | POA: Diagnosis present

## 2012-12-30 DIAGNOSIS — J449 Chronic obstructive pulmonary disease, unspecified: Secondary | ICD-10-CM | POA: Diagnosis present

## 2012-12-30 DIAGNOSIS — F1010 Alcohol abuse, uncomplicated: Secondary | ICD-10-CM

## 2012-12-30 DIAGNOSIS — R45851 Suicidal ideations: Secondary | ICD-10-CM

## 2012-12-30 DIAGNOSIS — J4489 Other specified chronic obstructive pulmonary disease: Secondary | ICD-10-CM | POA: Diagnosis present

## 2012-12-30 HISTORY — DX: Anemia, unspecified: D64.9

## 2012-12-30 HISTORY — DX: Alcohol abuse, uncomplicated: F10.10

## 2012-12-30 HISTORY — DX: Chronic obstructive pulmonary disease, unspecified: J44.9

## 2012-12-30 HISTORY — DX: Sleep apnea, unspecified: G47.30

## 2012-12-30 MED ORDER — PREGABALIN 75 MG PO CAPS
75.0000 mg | ORAL_CAPSULE | Freq: Three times a day (TID) | ORAL | Status: DC
  Administered 2012-12-30 – 2013-01-01 (×7): 75 mg via ORAL
  Filled 2012-12-30 (×7): qty 1

## 2012-12-30 MED ORDER — FENTANYL 50 MCG/HR TD PT72
100.0000 ug | MEDICATED_PATCH | TRANSDERMAL | Status: DC
  Administered 2012-12-30: 100 ug via TRANSDERMAL
  Filled 2012-12-30: qty 2

## 2012-12-30 MED ORDER — MOMETASONE FURO-FORMOTEROL FUM 200-5 MCG/ACT IN AERO
2.0000 | INHALATION_SPRAY | Freq: Two times a day (BID) | RESPIRATORY_TRACT | Status: DC
  Administered 2012-12-30 – 2013-01-01 (×5): 2 via RESPIRATORY_TRACT
  Filled 2012-12-30: qty 8.8

## 2012-12-30 MED ORDER — MAGNESIUM HYDROXIDE 400 MG/5ML PO SUSP: 30.0000 mL | Freq: Every day | ORAL | Status: DC | PRN

## 2012-12-30 MED ORDER — DULOXETINE HCL 30 MG PO CPEP
90.0000 mg | ORAL_CAPSULE | Freq: Every day | ORAL | Status: DC
  Administered 2012-12-30 – 2013-01-01 (×3): 90 mg via ORAL
  Filled 2012-12-30 (×4): qty 3

## 2012-12-30 MED ORDER — ALUM & MAG HYDROXIDE-SIMETH 200-200-20 MG/5ML PO SUSP: 30.0000 mL | ORAL | Status: DC | PRN

## 2012-12-30 MED ORDER — DULOXETINE HCL 30 MG PO CPEP: 30.0000 mg | ORAL_CAPSULE | Freq: Every day | ORAL | Status: DC

## 2012-12-30 MED ORDER — ACETAMINOPHEN 325 MG PO TABS
650.0000 mg | ORAL_TABLET | Freq: Four times a day (QID) | ORAL | Status: DC | PRN
  Administered 2012-12-31: 650 mg via ORAL

## 2012-12-30 MED ORDER — TRAZODONE HCL 50 MG PO TABS
50.0000 mg | ORAL_TABLET | Freq: Every day | ORAL | Status: DC
  Administered 2012-12-30 – 2013-01-01 (×3): 50 mg via ORAL
  Filled 2012-12-30 (×4): qty 1

## 2012-12-30 MED ORDER — BUDESONIDE 3 MG PO CP24
9.0000 mg | ORAL_CAPSULE | Freq: Every day | ORAL | Status: DC
  Administered 2012-12-30 – 2013-01-01 (×3): 9 mg via ORAL
  Filled 2012-12-30 (×4): qty 3

## 2012-12-30 MED ORDER — ALBUTEROL SULFATE HFA 108 (90 BASE) MCG/ACT IN AERS: 2.0000 | INHALATION_SPRAY | RESPIRATORY_TRACT | Status: DC | PRN

## 2012-12-30 MED ORDER — FENTANYL 25 MCG/HR TD PT72
25.0000 ug | MEDICATED_PATCH | TRANSDERMAL | Status: DC
  Administered 2012-12-30: 25 ug via TRANSDERMAL
  Filled 2012-12-30: qty 1

## 2012-12-30 MED ORDER — FERROUS SULFATE 325 (65 FE) MG PO TABS
325.0000 mg | ORAL_TABLET | Freq: Every day | ORAL | Status: DC
  Administered 2012-12-31 – 2013-01-01 (×2): 325 mg via ORAL
  Filled 2012-12-30 (×3): qty 1

## 2012-12-30 MED ORDER — TIOTROPIUM BROMIDE MONOHYDRATE 18 MCG IN CAPS
18.0000 ug | ORAL_CAPSULE | Freq: Every day | RESPIRATORY_TRACT | Status: DC
  Administered 2012-12-30 – 2013-01-01 (×3): 18 ug via RESPIRATORY_TRACT
  Filled 2012-12-30: qty 5

## 2012-12-30 MED ORDER — PANTOPRAZOLE SODIUM 40 MG PO TBEC
80.0000 mg | DELAYED_RELEASE_TABLET | Freq: Every day | ORAL | Status: DC
  Administered 2012-12-30 – 2013-01-01 (×3): 80 mg via ORAL
  Filled 2012-12-30 (×4): qty 2
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
## 2012-12-30 NOTE — Progress Notes (Addendum)
D: Patient denies SI/HI/AVH. Patient rates hopelessness as 1 and depression as 1.  Patient affect is appropriate. His mood is elated due to finding out that his cancer is no longer present.  Pt states that he asked for some sleep medication, however it was too late to receive any.  His appetite is good.  Pt states that his energy level is normal and his ability to pay attention is good. Pt states that he is feeling hopeful because of the positive news about his cancer and his relationship with his children.  Patient did attend morning group. Patient visible on the milieu. No distress noted. A: Support and encouragement offered. Scheduled medications given to pt. Q 15 min checks continued for patient safety. R: Patient receptive. Patient remains safe on the unit.

## 2012-12-30 NOTE — Tx Team (Signed)
Initial Interdisciplinary Treatment Plan  PATIENT STRENGTHS: (choose at least two) Active sense of humor Financial means General fund of knowledge  PATIENT STRESSORS: Health problems   PROBLEM LIST: Problem List/Patient Goals Date to be addressed Date deferred Reason deferred Estimated date of resolution  Depression with suicidal ideation 12/30/12     ETOH abuse 12/30/12                                                DISCHARGE CRITERIA:  Verbal commitment to aftercare and medication compliance  PRELIMINARY DISCHARGE PLAN: Outpatient therapy Return to previous living arrangement  PATIENT/FAMIILY INVOLVEMENT: This treatment plan has been presented to and reviewed with the patient, Jimmy Mabry Sr.,   Carroll Sage 12/30/2012, 5:15 AM

## 2012-12-30 NOTE — H&P (Signed)
  Pt was seen by me today and I agree with the key elements documented in H&P.  

## 2012-12-30 NOTE — BHH Suicide Risk Assessment (Signed)
Suicide Risk Assessment  Admission Assessment     Nursing information obtained from:  Patient Demographic factors:  Divorced or widowed;Caucasian;Unemployed Current Mental Status:  Denies si, no hi, no avh, logical Loss Factors:  Decline in physical health Historical Factors:  deneis si in the past Risk Reduction Factors:  Responsible for children under 47 years of age;Living with another person, especially a relative  CLINICAL FACTORS:   Alcohol/Substance Abuse/Dependencies  COGNITIVE FEATURES THAT CONTRIBUTE TO RISK:  Closed-mindedness    SUICIDE RISK:   Mild:  Suicidal ideation of limited frequency, intensity, duration, and specificity.  There are no identifiable plans, no associated intent, mild dysphoria and related symptoms, good self-control (both objective and subjective assessment), few other risk factors, and identifiable protective factors, including available and accessible social support.  PLAN OF CARE:   Will continue to observe for safety and withdrawal from alcool  I certify that inpatient services furnished can reasonably be expected to improve the patient's condition.  Raiford Simmonds 12/30/2012, 1:33 PM

## 2012-12-30 NOTE — BHH Group Notes (Addendum)
[facility] Group Notes:  (Nursing/MHT/Case Management/Adjunct)  Date:  12/30/2012  Time:  5:34 PM  Type of Therapy:  Group Therapy  Participation Level:  Active  Participation Quality:  Attentive, Sharing and Supportive  Affect:  Appropriate  Cognitive:  Appropriate  Insight:  Appropriate and Good  Engagement in Group:  Engaged  Modes of Intervention:  Discussion  Summary of Progress/Problems:  Per pt, her found out that he no longer has cancer.  Pt is in an elated mood.  Pt is also happy and feels hopeful about his relationship with his children.    Nash Mantis [facility] 12/30/2012, 5:34 PM
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
## 2012-12-30 NOTE — Progress Notes (Addendum)
Patient ID: Jimmy Nelson., male   DOB: 31-Jul-1966, 47 y.o.   MRN: [id number] D. The patient had a bright mood and affect all evening. He interacted appropriately in the milieu and actively participated in evening group. He shared how he had received good news today from his wife that the CT scan report came back and he was free of cancer.  A. Met wit patient 1:1. Reviewed and administered medication. Assessed for suicidal/homicidal ideation. R. The patient adamantly denies any suicidal /homicidal ideation and is grateful that God heard his prayers to free him of cancer. Anxious to get home to his son and his dogs.

## 2012-12-31 DIAGNOSIS — F332 Major depressive disorder, recurrent severe without psychotic features: Secondary | ICD-10-CM

## 2012-12-31 MED ORDER — HYDROXYZINE HCL 50 MG PO TABS
50.0000 mg | ORAL_TABLET | ORAL | Status: AC
  Administered 2012-12-31: 50 mg via ORAL

## 2012-12-31 NOTE — BHH Group Notes (Signed)
[facility] LCSW Group Therapy  12/31/2012 3:30-4:15pm  Summary of Progress/Problems:   The main focus of today's process group was to define "support" and describe what healthy supports are.  We then discussed how and why to increase patient supports, using motivational interviewing.  An emphasis was placed on using counselor, doctor, therapy groups, self-help groups and problem-specific support groups to expand supports.   The patient expressed that he has good supports at home, and that he can use them to hold him accountable.  Type of Therapy:  Group Therapy  Participation Level:  Active  Participation Quality:  Appropriate, Attentive and Sharing  Affect:  Blunted and Depressed  Cognitive:  Alert, Appropriate and Oriented  Insight:  Engaged  Engagement in Therapy:  Engaged  Modes of Intervention:  Discussion and Exploration   Lysle Dingwall 12/31/2012, 4:52 PM
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
## 2012-12-31 NOTE — Progress Notes (Signed)
Nutrition Brief Note  Patient identified on the Malnutrition Screening Tool (MST) Report  Body mass index is 19.53 kg/(m^2). Patient meets criteria for wnl based on current BMI.   Current diet order is regular, patient is consuming approximately good% of meals at this time. Labs and medications reviewed.   Patient states that he is eating very well here and "decent" at home but not as good as it should be secondary to stress.  Relieved that CT showed no cancer.  Stated that he finished his chemo and XRT in January or February.  UBW 140's since December and weight is stable.  Has hx of chron's but intake not effected by this much.  Tolerates most foods.  Was 170 lbs 1 year ago.  Instructed patient to work on Futures trader and weight.    Patient is 81% of her IBW.  Appears very emaciated with spotted skin.  Patient meets criteria for malnutrition related to chronic illness AEB decreased body fat, muscle mass, and intake <75% for >1 month. Patient does not tolerate Ensure.  Encouraged good intake of meals and snacks to improve nutritional status and weight.  No further nutrition interventions warranted at this time. If further nutrition issues arise, please consult RD.   Jimmy Nelson, RD, LDN Clinical Inpatient Dietitian Pager:  [phone] Weekend and after hours pager:  [phone]

## 2012-12-31 NOTE — Progress Notes (Signed)
D. Pt has been up and has been visible in milieu today and has been attending and participating in various milieu activities. Pt did endorse feelings of anxiety today and depression and has spoken about not being able to sleep well, as well as chronic back pain. Pt spoke about having good support systems in place and has denied SI. A. Support and encouragement provided. R. Will continue to monitor.

## 2012-12-31 NOTE — Progress Notes (Signed)
[place] Group Notes:  (Nursing/MHT/Case Management/Adjunct)  Date:  12/31/2012  Time:  2000  Type of Therapy:  Psychoeducational Skills  Participation Level:  Active  Participation Quality:  Attentive  Affect:  Flat  Cognitive:  Appropriate  Insight:  Improving  Engagement in Group:  Improving  Modes of Intervention:  Education  Summary of Progress/Problems: The patient verbalized that his day went well. He mentioned that he was very grateful for the staff. In addition, he stated that he learned a number of things from the daytime groups, i.e.. How to cope with his problems. His goal for tomorrow is to get discharged.   Archie Balboa S 12/31/2012, 10:11 PM

## 2012-12-31 NOTE — Progress Notes (Signed)
[facility] MD Progress Note  12/31/2012 3:03 PM Jimmy Belia Heman Sr.  MRN:  [id number] Subjective:  Jimmy Nelson reports that he is doing much today. He describes that a great deal of his depression comes from dealing with his medical problems. He reports that since finding out that a recent CT of his lung was clear that "I'm about as happy as I can be." Rates his depression at two. Denies feeling anxious. Patient is requesting discharge in the next day or two. He worried about being out by Thursday as his son has a court date on that day. The patient is attending groups and interacting well with peers.  Diagnosis:   Axis I: Alcohol Abuse and Major Depression, Recurrent severe Axis II: Deferred Axis III:  Past Medical History  Diagnosis Date  . Mental disorder   . Depression   . Crohn's disease Deteriorating disk  . Lung cancer   . COPD (chronic obstructive pulmonary disease)   . Sleep apnea   . Anemia    Axis IV: other psychosocial or environmental problems and problems with primary support group Axis V: 51-60 moderate symptoms  ADL's:  Intact  Sleep: Fair  Appetite:  Good  Suicidal Ideation:  Denies Homicidal Ideation:  Denies AEB (as evidenced by):  Psychiatric Specialty Exam: Review of Systems  Constitutional: Negative.   HENT: Negative.   Eyes: Negative.   Respiratory: Negative.   Cardiovascular: Negative.   Gastrointestinal: Negative.   Genitourinary: Negative.   Musculoskeletal: Negative.   Skin: Negative.   Neurological: Negative.   Endo/Heme/Allergies: Negative.   Psychiatric/Behavioral: Positive for depression. Negative for suicidal ideas, hallucinations, memory loss and substance abuse. The patient is nervous/anxious and has insomnia.     Blood pressure 106/68, pulse 96, temperature 98.4 F (36.9 C), temperature source Oral, resp. rate 16, height 5' 11"  (1.803 m), weight 63.504 kg (140 lb).Body mass index is 19.53 kg/(m^2).  General Appearance: Casual  Eye  Contact::  Good  Speech:  Clear and Coherent  Volume:  Normal  Mood:  Anxious  Affect:  Appropriate  Thought Process:  Goal Directed and Intact  Orientation:  Full (Time, Place, and Person)  Thought Content:  WDL  Suicidal Thoughts:  No  Homicidal Thoughts:  No  Memory:  Immediate;   Good Recent;   Good Remote;   Good  Judgement:  Fair  Insight:  Fair  Psychomotor Activity:  Normal  Concentration:  Good  Recall:  Good  Akathisia:  No  Handed:  Right  AIMS (if indicated):     Assets:  Communication Skills Desire for Improvement Financial Resources/Insurance Housing Intimacy Leisure Time  Sleep:  Number of Hours: 4.75   Current Medications: Current Facility-Administered Medications  Medication Dose Route Frequency Provider Last Rate Last Dose  . acetaminophen (TYLENOL) tablet 650 mg  650 mg Oral Q6H PRN Arville Go, NP      . albuterol (PROVENTIL HFA;VENTOLIN HFA) 108 (90 BASE) MCG/ACT inhaler 2 puff  2 puff Inhalation Q4H PRN Shuvon Rankin, NP      . alum & mag hydroxide-simeth (MAALOX/MYLANTA) 200-200-20 MG/5ML suspension 30 mL  30 mL Oral Q4H PRN Arville Go, NP      . budesonide (ENTOCORT EC) 24 hr capsule 9 mg  9 mg Oral Daily Shuvon Rankin, NP   9 mg at 12/31/12 0843  . DULoxetine (CYMBALTA) DR capsule 90 mg  90 mg Oral Daily Shuvon Rankin, NP   90 mg at 12/31/12 0844  . fentaNYL (DURAGESIC - dosed  mcg/hr) 100 mcg  100 mcg Transdermal Q72H Shuvon Rankin, NP   100 mcg at 12/30/12 1309  . fentaNYL (DURAGESIC - dosed mcg/hr) patch 25 mcg  25 mcg Transdermal Q72H Shuvon Rankin, NP   25 mcg at 12/30/12 1310  . ferrous sulfate tablet 325 mg  325 mg Oral Q breakfast Shuvon Rankin, NP   325 mg at 12/31/12 1232  . magnesium hydroxide (MILK OF MAGNESIA) suspension 30 mL  30 mL Oral Daily PRN Arville Go, NP      . mometasone-formoterol [facility]) 200-5 MCG/ACT inhaler 2 puff  2 puff Inhalation BID Shuvon Rankin, NP   2 puff at 12/31/12 0845  . pantoprazole (PROTONIX) EC  tablet 80 mg  80 mg Oral Daily Shuvon Rankin, NP   80 mg at 12/31/12 0845  . pregabalin (LYRICA) capsule 75 mg  75 mg Oral TID Shuvon Rankin, NP   75 mg at 12/31/12 1232  . tiotropium [facility]) inhalation capsule 18 mcg  18 mcg Inhalation Daily Shuvon Rankin, NP   18 mcg at 12/31/12 0846  . traZODone (DESYREL) tablet 50 mg  50 mg Oral QHS Shuvon Rankin, NP   50 mg at 12/30/12 2209    Lab Results: No results found for this or any previous visit (from the past 48 hour(s)).  Physical Findings: AIMS: Facial and Oral Movements Muscles of Facial Expression: None, normal Lips and Perioral Area: None, normal Jaw: None, normal Tongue: None, normal,Extremity Movements Upper (arms, wrists, hands, fingers): None, normal Lower (legs, knees, ankles, toes): None, normal, Trunk Movements Neck, shoulders, hips: None, normal, Overall Severity Severity of abnormal movements (highest score from questions above): None, normal Incapacitation due to abnormal movements: None, normal Patient's awareness of abnormal movements (rate only patient's report): No Awareness, Dental Status Current problems with teeth and/or dentures?: No Does patient usually wear dentures?: No  CIWA:  CIWA-Ar Total: 0 COWS:     Treatment Plan Summary: Daily contact with patient to assess and evaluate symptoms and progress in treatment Medication management  Plan: Continue crisis management and stabilization.  Medication management: Medications reviewed and no changes indicated at present.   Encouraged patient to attend groups and participate in group counseling sessions and activities.  Discharge plan in progress. Treatment team scheduled for tomorrow morning.  Continue current treatment plan.   Medical Decision Making Problem Points:  Established problem, stable/improving (1) and Review of psycho-social stressors (1) Data Points:  Review of medication regiment & side effects (2)  I certify that inpatient services furnished  can reasonably be expected to improve the patient's condition.   Elmarie Shiley ANN NP-C 12/31/2012, 3:03 PM

## 2013-01-01 MED ORDER — TRAZODONE HCL 50 MG PO TABS: 50.0000 mg | ORAL_TABLET | Freq: Every day | ORAL | Status: DC

## 2013-01-01 MED ORDER — TIOTROPIUM BROMIDE MONOHYDRATE 18 MCG IN CAPS: 18.0000 ug | ORAL_CAPSULE | Freq: Every day | RESPIRATORY_TRACT | Status: DC

## 2013-01-01 MED ORDER — DULOXETINE HCL 30 MG PO CPEP: 90.0000 mg | ORAL_CAPSULE | Freq: Every day | ORAL | Status: DC

## 2013-01-01 MED ORDER — OXYCODONE HCL 5 MG PO TABS: 10.0000 mg | ORAL_TABLET | ORAL | Status: DC | PRN

## 2013-01-01 MED ORDER — FERROUS SULFATE 325 (65 FE) MG PO TABS: 325.0000 mg | ORAL_TABLET | Freq: Every day | ORAL | Status: DC

## 2013-01-01 MED ORDER — FLUTICASONE-SALMETEROL 500-50 MCG/DOSE IN AEPB: 1.0000 | INHALATION_SPRAY | Freq: Two times a day (BID) | RESPIRATORY_TRACT | Status: DC

## 2013-01-01 MED ORDER — BUDESONIDE 3 MG PO CP24: 9.0000 mg | ORAL_CAPSULE | Freq: Every day | ORAL | Status: DC

## 2013-01-01 MED ORDER — ESOMEPRAZOLE MAGNESIUM 40 MG PO CPDR: 40.0000 mg | DELAYED_RELEASE_CAPSULE | Freq: Every day | ORAL | Status: DC

## 2013-01-01 MED ORDER — FENTANYL 100 MCG/HR TD PT72: 1.0000 | MEDICATED_PATCH | TRANSDERMAL | Status: DC

## 2013-01-01 MED ORDER — PREGABALIN 75 MG PO CAPS: 75.0000 mg | ORAL_CAPSULE | Freq: Three times a day (TID) | ORAL | Status: DC

## 2013-01-01 MED ORDER — FENTANYL 25 MCG/HR TD PT72: 1.0000 | MEDICATED_PATCH | TRANSDERMAL | Status: DC

## 2013-01-01 MED ORDER — MULTI-VITAMIN/MINERALS PO TABS: 1.0000 | ORAL_TABLET | Freq: Every day | ORAL | Status: DC

## 2013-01-01 NOTE — Progress Notes (Signed)
[place] Group Notes:  (Nursing/MHT/Case Management/Adjunct)  Date:  01/01/2013  Time:  1:46 PM  Type of Therapy:  Psychoeducational Skills  Participation Level:  Active  Participation Quality:  Attentive, Intrusive and Redirectable  Affect:  Anxious, Blunted and Defensive  Cognitive:  Appropriate  Insight:  Good  Engagement in Group:  Improving  Modes of Intervention:  Activity and Discussion  Summary of Progress/Problems: Jimmy Nelson attended group and shared at times. Patient defined self care in own terms. Patient reviewed and completed the self care assessment in the workbook. Patient reviewed with staff and peers about weakness and strengths of physical, spiritual, emotional, psychological, relationship self care. Jimmy Nelson was intrusive at times, shared and gave good feedback at times. Abe People Brittini 01/01/2013, 1:46 PM

## 2013-01-01 NOTE — Progress Notes (Signed)
Patient ID: Jimmy Mort., male   DOB: 04/26/1966, 47 y.o.   MRN: [id number] D: Patient lying in bed. Respirations even and non-labored. A: Staff will check q 15 minutes, follow treatment plan, and give meds as ordered. R: Appears to be sleeping.

## 2013-01-01 NOTE — Progress Notes (Signed)
Patient ID: Jimmy Nelson., male   DOB: 1966-01-30, 47 y.o.   MRN: [id number]  D: Patient lying in bed with eyes closed. Respirations even and non-labored. A: Staff will monitor on q 15 minute checks, follow treatment plan, and give meds R: Appears to be sleeping.
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
## 2013-01-01 NOTE — BHH Suicide Risk Assessment (Signed)
[place] INPATIENT:  Family/Significant Other Suicide Prevention Education  Suicide Prevention Education:  Education Completed; Nicklos Gaxiola, Wife, [phone]; has been identified by the patient as the family member/significant other with whom the patient will be residing, and identified as the person(s) who will aid the patient in the event of a mental health crisis (suicidal ideations/suicide attempt).  With written consent from the patient, the family member/significant other has been provided the following suicide prevention education, prior to the and/or following the discharge of the patient.  The suicide prevention education provided includes the following:  Suicide risk factors  Suicide prevention and interventions  National Suicide Hotline telephone number  [facility] assessment telephone number  [facility] Emergency Assistance [place] and/or Residential Mobile Crisis Unit telephone number  Request made of family/significant other to:  Remove weapons (e.g., guns, rifles, knives), all items previously/currently identified as safety concern.  Wife reports patient does not have access to guns.  Remove drugs/medications (over-the-counter, prescriptions, illicit drugs), all items previously/currently identified as a safety concern.  The family member/significant other verbalizes understanding of the suicide prevention education information provided.  The family member/significant other agrees to remove the items of safety concern listed above.  Concha Pyo 01/01/2013, 12:20 PM
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
## 2013-01-01 NOTE — [facility] Suicide Risk Assessment (Signed)
Suicide Risk Assessment  Discharge Assessment     Demographic Factors:  Male, Caucasian, Low socioeconomic status and Unemployed  Mental Status Per Nursing Assessment::   On Admission:  NA  Current Mental Status by Physician: Patient is alert and oriented to 4. Denies AH/Vh/SI/HI.  Loss Factors: Decline in physical health  Historical Factors: Impulsivity  Risk Reduction Factors:   Positive social support and Positive coping skills or problem solving skills  Continued Clinical Symptoms:  Alcohol/Substance Abuse/Dependencies  Cognitive Features That Contribute To Risk:  Cognitively intact   Suicide Risk:  Minimal: No identifiable suicidal ideation.  Patients presenting with no risk factors but with morbid ruminations; may be classified as minimal risk based on the severity of the depressive symptoms  Discharge Diagnoses:   AXIS I:  Alcohol Abuse and Substance Induced Mood Disorder AXIS II:  Deferred AXIS III:   Past Medical History  Diagnosis Date  . Mental disorder   . Depression   . Crohn's disease Deteriorating disk  . Lung cancer   . COPD (chronic obstructive pulmonary disease)   . Sleep apnea   . Anemia    AXIS IV:  other psychosocial or environmental problems AXIS V:  61-70 mild symptoms  Plan Of Care/Follow-up recommendations:  Activity:  as tolerated Diet:  regular Follow up with outpatient appointments.  Is patient on multiple antipsychotic therapies at discharge:  No   Has Patient had three or more failed trials of antipsychotic monotherapy by history:  No  Recommended Plan for Multiple Antipsychotic Therapies: NA  Jimmy Nelson 01/01/2013, 10:34 AM

## 2013-01-01 NOTE — Progress Notes (Signed)
Patient ID: Jimmy Mort., male   DOB: 1966-08-28, 47 y.o.   MRN: [id number] Patient is discharged ambulatory to ride home with his wife.  He denies SI/HI.  He verbalizes understanding of d/c meds and follow up.  He was given a a script for new med cymbalta.  His belongings including $400 dollars was returned  Patient is emphatic that he does not plan to drink again.

## 2013-01-01 NOTE — Progress Notes (Signed)
[facility] Adult Case Management Discharge Plan :  Will you be returning to the same living situation after discharge: Yes,  Patient returning home with wife At discharge, do you have transportation home?:Yes,  Wife and daughter to transport patient home. Do you have the ability to pay for your medications:Yes,  Patient reports having Medicare and Medicaid.  Release of information consent forms completed and in the chart;  Patient's signature needed at discharge.  Patient to Follow up at: Follow-up Information   Follow up with [facility] On 01/02/2013. (3:00 PM with Jesse Sans on Tuesday, January 02, 2013)    Contact information:   [address] [place], [place]  [address]  [phone]      Patient denies SI/HI:   Yes,  Patient is not endorsing SI/HI or thoughts of self harm.    Safety Planning and Suicide Prevention discussed:  Yes,  Reviewed during aftercare group.  Concha Pyo 01/01/2013, 12:32 PM

## 2013-01-05 NOTE — Progress Notes (Signed)
Patient Discharge Instructions:  After Visit Summary (AVS):   Faxed to:  01/05/13 Discharge Summary Note:   Faxed to:  01/05/13 Psychiatric Admission Assessment Note:   Faxed to:  01/05/13 Suicide Risk Assessment - Discharge Assessment:   Faxed to:  01/05/13 Faxed/Sent to the Next Level Care provider:  01/05/13 Faxed to [facility] [place], 01/05/2013, 3:19 PM

## 2013-01-20 DIAGNOSIS — K219 Gastro-esophageal reflux disease without esophagitis: Secondary | ICD-10-CM | POA: Insufficient documentation

## 2013-01-20 HISTORY — DX: Gastro-esophageal reflux disease without esophagitis: K21.9

## 2013-01-23 DIAGNOSIS — H2589 Other age-related cataract: Secondary | ICD-10-CM | POA: Diagnosis not present

## 2013-01-23 DIAGNOSIS — H2510 Age-related nuclear cataract, unspecified eye: Secondary | ICD-10-CM | POA: Diagnosis not present

## 2013-01-23 DIAGNOSIS — H269 Unspecified cataract: Secondary | ICD-10-CM | POA: Diagnosis not present

## 2013-02-06 DIAGNOSIS — G8929 Other chronic pain: Secondary | ICD-10-CM | POA: Diagnosis not present

## 2013-02-06 DIAGNOSIS — Z79899 Other long term (current) drug therapy: Secondary | ICD-10-CM | POA: Diagnosis not present

## 2013-02-06 DIAGNOSIS — R079 Chest pain, unspecified: Secondary | ICD-10-CM | POA: Diagnosis not present

## 2013-02-06 DIAGNOSIS — G893 Neoplasm related pain (acute) (chronic): Secondary | ICD-10-CM | POA: Diagnosis not present

## 2013-02-06 DIAGNOSIS — M549 Dorsalgia, unspecified: Secondary | ICD-10-CM | POA: Diagnosis not present

## 2013-03-06 DIAGNOSIS — Z79899 Other long term (current) drug therapy: Secondary | ICD-10-CM | POA: Diagnosis not present

## 2013-03-06 DIAGNOSIS — G8929 Other chronic pain: Secondary | ICD-10-CM | POA: Diagnosis not present

## 2013-03-06 DIAGNOSIS — R079 Chest pain, unspecified: Secondary | ICD-10-CM | POA: Diagnosis not present

## 2013-03-06 DIAGNOSIS — M545 Low back pain, unspecified: Secondary | ICD-10-CM | POA: Diagnosis not present

## 2013-08-02 DIAGNOSIS — R079 Chest pain, unspecified: Secondary | ICD-10-CM | POA: Diagnosis not present

## 2013-08-02 DIAGNOSIS — Z23 Encounter for immunization: Secondary | ICD-10-CM | POA: Diagnosis not present

## 2013-08-02 DIAGNOSIS — M545 Low back pain, unspecified: Secondary | ICD-10-CM | POA: Diagnosis not present

## 2013-08-02 DIAGNOSIS — Z5181 Encounter for therapeutic drug level monitoring: Secondary | ICD-10-CM | POA: Diagnosis not present

## 2013-08-02 DIAGNOSIS — Z79899 Other long term (current) drug therapy: Secondary | ICD-10-CM | POA: Diagnosis not present

## 2013-08-02 DIAGNOSIS — G8929 Other chronic pain: Secondary | ICD-10-CM | POA: Diagnosis not present

## 2013-09-27 DIAGNOSIS — S0990XA Unspecified injury of head, initial encounter: Secondary | ICD-10-CM | POA: Diagnosis not present

## 2013-09-27 DIAGNOSIS — S199XXA Unspecified injury of neck, initial encounter: Secondary | ICD-10-CM | POA: Diagnosis not present

## 2013-09-27 DIAGNOSIS — R51 Headache: Secondary | ICD-10-CM | POA: Diagnosis not present

## 2013-09-27 DIAGNOSIS — S0083XA Contusion of other part of head, initial encounter: Secondary | ICD-10-CM | POA: Diagnosis not present

## 2013-09-27 DIAGNOSIS — S0993XA Unspecified injury of face, initial encounter: Secondary | ICD-10-CM | POA: Diagnosis not present

## 2013-09-27 DIAGNOSIS — R296 Repeated falls: Secondary | ICD-10-CM | POA: Diagnosis not present

## 2013-09-27 DIAGNOSIS — S0003XA Contusion of scalp, initial encounter: Secondary | ICD-10-CM | POA: Diagnosis not present

## 2013-09-27 DIAGNOSIS — M542 Cervicalgia: Secondary | ICD-10-CM | POA: Diagnosis not present

## 2013-10-24 DIAGNOSIS — E876 Hypokalemia: Secondary | ICD-10-CM | POA: Diagnosis not present

## 2013-10-24 DIAGNOSIS — Z9221 Personal history of antineoplastic chemotherapy: Secondary | ICD-10-CM | POA: Diagnosis not present

## 2013-10-24 DIAGNOSIS — R918 Other nonspecific abnormal finding of lung field: Secondary | ICD-10-CM | POA: Diagnosis not present

## 2013-10-24 DIAGNOSIS — R042 Hemoptysis: Secondary | ICD-10-CM | POA: Diagnosis not present

## 2013-10-24 DIAGNOSIS — Z91041 Radiographic dye allergy status: Secondary | ICD-10-CM | POA: Diagnosis not present

## 2013-10-24 DIAGNOSIS — J449 Chronic obstructive pulmonary disease, unspecified: Secondary | ICD-10-CM | POA: Diagnosis not present

## 2013-10-24 DIAGNOSIS — IMO0002 Reserved for concepts with insufficient information to code with codable children: Secondary | ICD-10-CM | POA: Diagnosis not present

## 2013-10-24 DIAGNOSIS — Z85118 Personal history of other malignant neoplasm of bronchus and lung: Secondary | ICD-10-CM | POA: Diagnosis not present

## 2013-10-24 DIAGNOSIS — F17200 Nicotine dependence, unspecified, uncomplicated: Secondary | ICD-10-CM | POA: Diagnosis not present

## 2013-10-24 DIAGNOSIS — K5090 Crohn's disease, unspecified, without complications: Secondary | ICD-10-CM | POA: Diagnosis not present

## 2013-10-24 DIAGNOSIS — C3490 Malignant neoplasm of unspecified part of unspecified bronchus or lung: Secondary | ICD-10-CM | POA: Diagnosis not present

## 2013-10-24 DIAGNOSIS — J189 Pneumonia, unspecified organism: Secondary | ICD-10-CM | POA: Diagnosis not present

## 2013-10-28 DIAGNOSIS — Z85118 Personal history of other malignant neoplasm of bronchus and lung: Secondary | ICD-10-CM | POA: Diagnosis not present

## 2013-10-28 DIAGNOSIS — K5090 Crohn's disease, unspecified, without complications: Secondary | ICD-10-CM | POA: Diagnosis not present

## 2013-10-28 DIAGNOSIS — R51 Headache: Secondary | ICD-10-CM | POA: Diagnosis not present

## 2013-10-28 DIAGNOSIS — F17200 Nicotine dependence, unspecified, uncomplicated: Secondary | ICD-10-CM | POA: Diagnosis not present

## 2013-10-28 DIAGNOSIS — IMO0001 Reserved for inherently not codable concepts without codable children: Secondary | ICD-10-CM | POA: Diagnosis not present

## 2013-10-28 DIAGNOSIS — IMO0002 Reserved for concepts with insufficient information to code with codable children: Secondary | ICD-10-CM | POA: Diagnosis not present

## 2013-10-28 DIAGNOSIS — J449 Chronic obstructive pulmonary disease, unspecified: Secondary | ICD-10-CM | POA: Diagnosis not present

## 2013-11-01 DIAGNOSIS — C3410 Malignant neoplasm of upper lobe, unspecified bronchus or lung: Secondary | ICD-10-CM | POA: Diagnosis not present

## 2013-11-01 DIAGNOSIS — Z09 Encounter for follow-up examination after completed treatment for conditions other than malignant neoplasm: Secondary | ICD-10-CM | POA: Diagnosis not present

## 2014-01-01 DIAGNOSIS — R079 Chest pain, unspecified: Secondary | ICD-10-CM | POA: Diagnosis not present

## 2014-01-01 DIAGNOSIS — Z5181 Encounter for therapeutic drug level monitoring: Secondary | ICD-10-CM | POA: Diagnosis not present

## 2014-01-01 DIAGNOSIS — Z859 Personal history of malignant neoplasm, unspecified: Secondary | ICD-10-CM | POA: Diagnosis not present

## 2014-01-01 DIAGNOSIS — G893 Neoplasm related pain (acute) (chronic): Secondary | ICD-10-CM | POA: Diagnosis not present

## 2014-01-01 DIAGNOSIS — Z79899 Other long term (current) drug therapy: Secondary | ICD-10-CM | POA: Diagnosis not present

## 2014-01-01 DIAGNOSIS — M545 Low back pain, unspecified: Secondary | ICD-10-CM | POA: Diagnosis not present

## 2014-02-15 DIAGNOSIS — R413 Other amnesia: Secondary | ICD-10-CM | POA: Diagnosis not present

## 2014-02-15 DIAGNOSIS — IMO0001 Reserved for inherently not codable concepts without codable children: Secondary | ICD-10-CM | POA: Diagnosis not present

## 2014-02-15 DIAGNOSIS — J449 Chronic obstructive pulmonary disease, unspecified: Secondary | ICD-10-CM | POA: Diagnosis not present

## 2014-02-15 DIAGNOSIS — Z85118 Personal history of other malignant neoplasm of bronchus and lung: Secondary | ICD-10-CM | POA: Diagnosis not present

## 2014-02-15 DIAGNOSIS — K5080 Crohn's disease of both small and large intestine without complications: Secondary | ICD-10-CM | POA: Diagnosis not present

## 2014-02-15 DIAGNOSIS — Z79899 Other long term (current) drug therapy: Secondary | ICD-10-CM | POA: Diagnosis not present

## 2014-02-15 DIAGNOSIS — K5289 Other specified noninfective gastroenteritis and colitis: Secondary | ICD-10-CM | POA: Diagnosis not present

## 2014-02-15 DIAGNOSIS — N189 Chronic kidney disease, unspecified: Secondary | ICD-10-CM | POA: Diagnosis not present

## 2014-02-15 DIAGNOSIS — K5090 Crohn's disease, unspecified, without complications: Secondary | ICD-10-CM | POA: Diagnosis not present

## 2014-02-15 DIAGNOSIS — Z91041 Radiographic dye allergy status: Secondary | ICD-10-CM | POA: Diagnosis not present

## 2014-02-15 DIAGNOSIS — F17200 Nicotine dependence, unspecified, uncomplicated: Secondary | ICD-10-CM | POA: Diagnosis not present

## 2014-02-15 DIAGNOSIS — K6389 Other specified diseases of intestine: Secondary | ICD-10-CM | POA: Diagnosis not present

## 2014-02-15 DIAGNOSIS — K219 Gastro-esophageal reflux disease without esophagitis: Secondary | ICD-10-CM | POA: Diagnosis not present

## 2014-02-15 DIAGNOSIS — IMO0002 Reserved for concepts with insufficient information to code with codable children: Secondary | ICD-10-CM | POA: Diagnosis not present

## 2014-03-16 DIAGNOSIS — J8410 Pulmonary fibrosis, unspecified: Secondary | ICD-10-CM | POA: Diagnosis not present

## 2014-03-16 DIAGNOSIS — R072 Precordial pain: Secondary | ICD-10-CM | POA: Diagnosis not present

## 2014-03-16 DIAGNOSIS — J189 Pneumonia, unspecified organism: Secondary | ICD-10-CM | POA: Diagnosis not present

## 2014-03-16 DIAGNOSIS — R222 Localized swelling, mass and lump, trunk: Secondary | ICD-10-CM | POA: Diagnosis not present

## 2014-03-16 DIAGNOSIS — R079 Chest pain, unspecified: Secondary | ICD-10-CM | POA: Diagnosis not present

## 2014-03-16 DIAGNOSIS — R0602 Shortness of breath: Secondary | ICD-10-CM | POA: Diagnosis not present

## 2014-03-16 DIAGNOSIS — J438 Other emphysema: Secondary | ICD-10-CM | POA: Diagnosis not present

## 2014-03-16 DIAGNOSIS — Z79899 Other long term (current) drug therapy: Secondary | ICD-10-CM | POA: Diagnosis not present

## 2014-03-19 DIAGNOSIS — R222 Localized swelling, mass and lump, trunk: Secondary | ICD-10-CM | POA: Diagnosis not present

## 2014-03-19 DIAGNOSIS — Z09 Encounter for follow-up examination after completed treatment for conditions other than malignant neoplasm: Secondary | ICD-10-CM | POA: Diagnosis not present

## 2014-03-19 DIAGNOSIS — J45909 Unspecified asthma, uncomplicated: Secondary | ICD-10-CM | POA: Diagnosis not present

## 2014-03-19 DIAGNOSIS — F17200 Nicotine dependence, unspecified, uncomplicated: Secondary | ICD-10-CM | POA: Diagnosis not present

## 2014-03-19 DIAGNOSIS — C3410 Malignant neoplasm of upper lobe, unspecified bronchus or lung: Secondary | ICD-10-CM | POA: Diagnosis not present

## 2014-03-20 DIAGNOSIS — J690 Pneumonitis due to inhalation of food and vomit: Secondary | ICD-10-CM | POA: Diagnosis present

## 2014-03-20 DIAGNOSIS — T50901A Poisoning by unspecified drugs, medicaments and biological substances, accidental (unintentional), initial encounter: Secondary | ICD-10-CM | POA: Diagnosis not present

## 2014-03-20 DIAGNOSIS — E876 Hypokalemia: Secondary | ICD-10-CM | POA: Diagnosis not present

## 2014-03-20 DIAGNOSIS — F17200 Nicotine dependence, unspecified, uncomplicated: Secondary | ICD-10-CM | POA: Diagnosis not present

## 2014-03-20 DIAGNOSIS — J441 Chronic obstructive pulmonary disease with (acute) exacerbation: Secondary | ICD-10-CM | POA: Diagnosis not present

## 2014-03-20 DIAGNOSIS — G893 Neoplasm related pain (acute) (chronic): Secondary | ICD-10-CM | POA: Diagnosis present

## 2014-03-20 DIAGNOSIS — G8929 Other chronic pain: Secondary | ICD-10-CM | POA: Diagnosis present

## 2014-03-20 DIAGNOSIS — J449 Chronic obstructive pulmonary disease, unspecified: Secondary | ICD-10-CM | POA: Diagnosis not present

## 2014-03-20 DIAGNOSIS — E870 Hyperosmolality and hypernatremia: Secondary | ICD-10-CM | POA: Diagnosis not present

## 2014-03-20 DIAGNOSIS — R0602 Shortness of breath: Secondary | ICD-10-CM | POA: Diagnosis not present

## 2014-03-20 DIAGNOSIS — D649 Anemia, unspecified: Secondary | ICD-10-CM | POA: Diagnosis not present

## 2014-03-20 DIAGNOSIS — J9600 Acute respiratory failure, unspecified whether with hypoxia or hypercapnia: Secondary | ICD-10-CM | POA: Diagnosis not present

## 2014-03-20 DIAGNOSIS — Z79899 Other long term (current) drug therapy: Secondary | ICD-10-CM | POA: Diagnosis not present

## 2014-03-20 DIAGNOSIS — Z7901 Long term (current) use of anticoagulants: Secondary | ICD-10-CM | POA: Diagnosis not present

## 2014-03-20 DIAGNOSIS — K219 Gastro-esophageal reflux disease without esophagitis: Secondary | ICD-10-CM | POA: Diagnosis not present

## 2014-03-20 DIAGNOSIS — I951 Orthostatic hypotension: Secondary | ICD-10-CM | POA: Diagnosis not present

## 2014-03-20 DIAGNOSIS — R0989 Other specified symptoms and signs involving the circulatory and respiratory systems: Secondary | ICD-10-CM | POA: Diagnosis not present

## 2014-03-20 DIAGNOSIS — E8779 Other fluid overload: Secondary | ICD-10-CM | POA: Diagnosis not present

## 2014-03-20 DIAGNOSIS — M7989 Other specified soft tissue disorders: Secondary | ICD-10-CM | POA: Diagnosis not present

## 2014-03-20 DIAGNOSIS — I82619 Acute embolism and thrombosis of superficial veins of unspecified upper extremity: Secondary | ICD-10-CM | POA: Diagnosis present

## 2014-03-20 DIAGNOSIS — N179 Acute kidney failure, unspecified: Secondary | ICD-10-CM | POA: Diagnosis present

## 2014-03-20 DIAGNOSIS — R0609 Other forms of dyspnea: Secondary | ICD-10-CM | POA: Diagnosis not present

## 2014-03-20 DIAGNOSIS — J189 Pneumonia, unspecified organism: Secondary | ICD-10-CM | POA: Diagnosis not present

## 2014-03-20 DIAGNOSIS — R0902 Hypoxemia: Secondary | ICD-10-CM | POA: Diagnosis not present

## 2014-03-20 DIAGNOSIS — T400X1A Poisoning by opium, accidental (unintentional), initial encounter: Secondary | ICD-10-CM | POA: Diagnosis not present

## 2014-03-20 DIAGNOSIS — T4271XA Poisoning by unspecified antiepileptic and sedative-hypnotic drugs, accidental (unintentional), initial encounter: Secondary | ICD-10-CM | POA: Diagnosis not present

## 2014-03-20 DIAGNOSIS — J4489 Other specified chronic obstructive pulmonary disease: Secondary | ICD-10-CM | POA: Diagnosis not present

## 2014-03-20 DIAGNOSIS — G4730 Sleep apnea, unspecified: Secondary | ICD-10-CM | POA: Diagnosis present

## 2014-03-20 DIAGNOSIS — T40601A Poisoning by unspecified narcotics, accidental (unintentional), initial encounter: Secondary | ICD-10-CM | POA: Diagnosis not present

## 2014-03-20 DIAGNOSIS — I959 Hypotension, unspecified: Secondary | ICD-10-CM | POA: Diagnosis not present

## 2014-03-20 DIAGNOSIS — C3490 Malignant neoplasm of unspecified part of unspecified bronchus or lung: Secondary | ICD-10-CM | POA: Diagnosis present

## 2014-03-27 DIAGNOSIS — R0609 Other forms of dyspnea: Secondary | ICD-10-CM | POA: Diagnosis not present

## 2014-03-31 DIAGNOSIS — J441 Chronic obstructive pulmonary disease with (acute) exacerbation: Secondary | ICD-10-CM | POA: Diagnosis not present

## 2014-03-31 DIAGNOSIS — C3410 Malignant neoplasm of upper lobe, unspecified bronchus or lung: Secondary | ICD-10-CM | POA: Diagnosis not present

## 2014-03-31 DIAGNOSIS — G894 Chronic pain syndrome: Secondary | ICD-10-CM | POA: Diagnosis not present

## 2014-03-31 DIAGNOSIS — I9589 Other hypotension: Secondary | ICD-10-CM | POA: Diagnosis not present

## 2014-04-01 DIAGNOSIS — I9589 Other hypotension: Secondary | ICD-10-CM | POA: Diagnosis not present

## 2014-04-01 DIAGNOSIS — G894 Chronic pain syndrome: Secondary | ICD-10-CM | POA: Diagnosis not present

## 2014-04-01 DIAGNOSIS — C3410 Malignant neoplasm of upper lobe, unspecified bronchus or lung: Secondary | ICD-10-CM | POA: Diagnosis not present

## 2014-04-01 DIAGNOSIS — J441 Chronic obstructive pulmonary disease with (acute) exacerbation: Secondary | ICD-10-CM | POA: Diagnosis not present

## 2014-04-02 DIAGNOSIS — I9589 Other hypotension: Secondary | ICD-10-CM | POA: Diagnosis not present

## 2014-04-02 DIAGNOSIS — G894 Chronic pain syndrome: Secondary | ICD-10-CM | POA: Diagnosis not present

## 2014-04-02 DIAGNOSIS — J441 Chronic obstructive pulmonary disease with (acute) exacerbation: Secondary | ICD-10-CM | POA: Diagnosis not present

## 2014-04-02 DIAGNOSIS — C3410 Malignant neoplasm of upper lobe, unspecified bronchus or lung: Secondary | ICD-10-CM | POA: Diagnosis not present

## 2014-04-03 DIAGNOSIS — J45909 Unspecified asthma, uncomplicated: Secondary | ICD-10-CM | POA: Diagnosis not present

## 2014-04-03 DIAGNOSIS — R222 Localized swelling, mass and lump, trunk: Secondary | ICD-10-CM | POA: Diagnosis not present

## 2014-04-03 DIAGNOSIS — F17200 Nicotine dependence, unspecified, uncomplicated: Secondary | ICD-10-CM | POA: Diagnosis not present

## 2014-04-04 DIAGNOSIS — C3410 Malignant neoplasm of upper lobe, unspecified bronchus or lung: Secondary | ICD-10-CM | POA: Diagnosis not present

## 2014-04-04 DIAGNOSIS — I9589 Other hypotension: Secondary | ICD-10-CM | POA: Diagnosis not present

## 2014-04-04 DIAGNOSIS — I10 Essential (primary) hypertension: Secondary | ICD-10-CM | POA: Diagnosis not present

## 2014-04-04 DIAGNOSIS — Z7901 Long term (current) use of anticoagulants: Secondary | ICD-10-CM | POA: Diagnosis not present

## 2014-04-04 DIAGNOSIS — M7989 Other specified soft tissue disorders: Secondary | ICD-10-CM | POA: Diagnosis not present

## 2014-04-04 DIAGNOSIS — F17200 Nicotine dependence, unspecified, uncomplicated: Secondary | ICD-10-CM | POA: Diagnosis not present

## 2014-04-04 DIAGNOSIS — J441 Chronic obstructive pulmonary disease with (acute) exacerbation: Secondary | ICD-10-CM | POA: Diagnosis not present

## 2014-04-04 DIAGNOSIS — M79609 Pain in unspecified limb: Secondary | ICD-10-CM | POA: Diagnosis not present

## 2014-04-04 DIAGNOSIS — J449 Chronic obstructive pulmonary disease, unspecified: Secondary | ICD-10-CM | POA: Diagnosis not present

## 2014-04-04 DIAGNOSIS — Z86718 Personal history of other venous thrombosis and embolism: Secondary | ICD-10-CM | POA: Diagnosis not present

## 2014-04-04 DIAGNOSIS — R609 Edema, unspecified: Secondary | ICD-10-CM | POA: Diagnosis not present

## 2014-04-04 DIAGNOSIS — G894 Chronic pain syndrome: Secondary | ICD-10-CM | POA: Diagnosis not present

## 2014-04-04 DIAGNOSIS — K219 Gastro-esophageal reflux disease without esophagitis: Secondary | ICD-10-CM | POA: Diagnosis not present

## 2014-04-04 DIAGNOSIS — E876 Hypokalemia: Secondary | ICD-10-CM | POA: Diagnosis not present

## 2014-04-04 DIAGNOSIS — Z79899 Other long term (current) drug therapy: Secondary | ICD-10-CM | POA: Diagnosis not present

## 2014-04-04 DIAGNOSIS — D649 Anemia, unspecified: Secondary | ICD-10-CM | POA: Diagnosis not present

## 2014-04-05 DIAGNOSIS — J441 Chronic obstructive pulmonary disease with (acute) exacerbation: Secondary | ICD-10-CM | POA: Diagnosis not present

## 2014-04-05 DIAGNOSIS — I9589 Other hypotension: Secondary | ICD-10-CM | POA: Diagnosis not present

## 2014-04-05 DIAGNOSIS — C3410 Malignant neoplasm of upper lobe, unspecified bronchus or lung: Secondary | ICD-10-CM | POA: Diagnosis not present

## 2014-04-05 DIAGNOSIS — G894 Chronic pain syndrome: Secondary | ICD-10-CM | POA: Diagnosis not present

## 2014-04-08 DIAGNOSIS — I498 Other specified cardiac arrhythmias: Secondary | ICD-10-CM | POA: Diagnosis not present

## 2014-04-08 DIAGNOSIS — R222 Localized swelling, mass and lump, trunk: Secondary | ICD-10-CM | POA: Diagnosis not present

## 2014-04-08 DIAGNOSIS — R918 Other nonspecific abnormal finding of lung field: Secondary | ICD-10-CM | POA: Diagnosis not present

## 2014-04-08 DIAGNOSIS — Z7901 Long term (current) use of anticoagulants: Secondary | ICD-10-CM | POA: Diagnosis not present

## 2014-04-08 DIAGNOSIS — Z87891 Personal history of nicotine dependence: Secondary | ICD-10-CM | POA: Diagnosis not present

## 2014-04-08 DIAGNOSIS — D1430 Benign neoplasm of unspecified bronchus and lung: Secondary | ICD-10-CM | POA: Diagnosis not present

## 2014-04-09 DIAGNOSIS — C3410 Malignant neoplasm of upper lobe, unspecified bronchus or lung: Secondary | ICD-10-CM | POA: Diagnosis not present

## 2014-04-09 DIAGNOSIS — G894 Chronic pain syndrome: Secondary | ICD-10-CM | POA: Diagnosis not present

## 2014-04-09 DIAGNOSIS — I9589 Other hypotension: Secondary | ICD-10-CM | POA: Diagnosis not present

## 2014-04-09 DIAGNOSIS — C3490 Malignant neoplasm of unspecified part of unspecified bronchus or lung: Secondary | ICD-10-CM | POA: Diagnosis not present

## 2014-04-09 DIAGNOSIS — Z9981 Dependence on supplemental oxygen: Secondary | ICD-10-CM | POA: Diagnosis not present

## 2014-04-09 DIAGNOSIS — J441 Chronic obstructive pulmonary disease with (acute) exacerbation: Secondary | ICD-10-CM | POA: Diagnosis not present

## 2014-04-09 DIAGNOSIS — J449 Chronic obstructive pulmonary disease, unspecified: Secondary | ICD-10-CM | POA: Diagnosis not present

## 2014-04-09 DIAGNOSIS — F17200 Nicotine dependence, unspecified, uncomplicated: Secondary | ICD-10-CM | POA: Diagnosis not present

## 2014-04-10 DIAGNOSIS — R0602 Shortness of breath: Secondary | ICD-10-CM | POA: Diagnosis not present

## 2014-04-10 DIAGNOSIS — C3410 Malignant neoplasm of upper lobe, unspecified bronchus or lung: Secondary | ICD-10-CM | POA: Diagnosis not present

## 2014-04-10 DIAGNOSIS — J441 Chronic obstructive pulmonary disease with (acute) exacerbation: Secondary | ICD-10-CM | POA: Diagnosis not present

## 2014-04-10 DIAGNOSIS — R079 Chest pain, unspecified: Secondary | ICD-10-CM | POA: Diagnosis not present

## 2014-04-10 DIAGNOSIS — G894 Chronic pain syndrome: Secondary | ICD-10-CM | POA: Diagnosis not present

## 2014-04-10 DIAGNOSIS — R5381 Other malaise: Secondary | ICD-10-CM | POA: Diagnosis not present

## 2014-04-10 DIAGNOSIS — I9589 Other hypotension: Secondary | ICD-10-CM | POA: Diagnosis not present

## 2014-04-10 DIAGNOSIS — C3490 Malignant neoplasm of unspecified part of unspecified bronchus or lung: Secondary | ICD-10-CM | POA: Diagnosis not present

## 2014-04-10 DIAGNOSIS — R072 Precordial pain: Secondary | ICD-10-CM | POA: Diagnosis not present

## 2014-04-10 DIAGNOSIS — R5383 Other fatigue: Secondary | ICD-10-CM | POA: Diagnosis not present

## 2014-04-11 DIAGNOSIS — I9589 Other hypotension: Secondary | ICD-10-CM | POA: Diagnosis not present

## 2014-04-11 DIAGNOSIS — G894 Chronic pain syndrome: Secondary | ICD-10-CM | POA: Diagnosis not present

## 2014-04-11 DIAGNOSIS — J441 Chronic obstructive pulmonary disease with (acute) exacerbation: Secondary | ICD-10-CM | POA: Diagnosis not present

## 2014-04-11 DIAGNOSIS — C3410 Malignant neoplasm of upper lobe, unspecified bronchus or lung: Secondary | ICD-10-CM | POA: Diagnosis not present

## 2014-04-15 DIAGNOSIS — G894 Chronic pain syndrome: Secondary | ICD-10-CM | POA: Diagnosis not present

## 2014-04-15 DIAGNOSIS — I9589 Other hypotension: Secondary | ICD-10-CM | POA: Diagnosis not present

## 2014-04-15 DIAGNOSIS — IMO0002 Reserved for concepts with insufficient information to code with codable children: Secondary | ICD-10-CM | POA: Diagnosis not present

## 2014-04-15 DIAGNOSIS — E860 Dehydration: Secondary | ICD-10-CM | POA: Diagnosis not present

## 2014-04-15 DIAGNOSIS — C3410 Malignant neoplasm of upper lobe, unspecified bronchus or lung: Secondary | ICD-10-CM | POA: Diagnosis not present

## 2014-04-15 DIAGNOSIS — J441 Chronic obstructive pulmonary disease with (acute) exacerbation: Secondary | ICD-10-CM | POA: Diagnosis not present

## 2014-04-19 DIAGNOSIS — I9589 Other hypotension: Secondary | ICD-10-CM | POA: Diagnosis not present

## 2014-04-19 DIAGNOSIS — J441 Chronic obstructive pulmonary disease with (acute) exacerbation: Secondary | ICD-10-CM | POA: Diagnosis not present

## 2014-04-19 DIAGNOSIS — C3410 Malignant neoplasm of upper lobe, unspecified bronchus or lung: Secondary | ICD-10-CM | POA: Diagnosis not present

## 2014-04-19 DIAGNOSIS — G894 Chronic pain syndrome: Secondary | ICD-10-CM | POA: Diagnosis not present

## 2014-04-22 DIAGNOSIS — C3410 Malignant neoplasm of upper lobe, unspecified bronchus or lung: Secondary | ICD-10-CM | POA: Diagnosis not present

## 2014-04-22 DIAGNOSIS — I9589 Other hypotension: Secondary | ICD-10-CM | POA: Diagnosis not present

## 2014-04-22 DIAGNOSIS — J441 Chronic obstructive pulmonary disease with (acute) exacerbation: Secondary | ICD-10-CM | POA: Diagnosis not present

## 2014-04-22 DIAGNOSIS — G894 Chronic pain syndrome: Secondary | ICD-10-CM | POA: Diagnosis not present

## 2014-05-03 DIAGNOSIS — K5090 Crohn's disease, unspecified, without complications: Secondary | ICD-10-CM | POA: Diagnosis not present

## 2014-05-03 DIAGNOSIS — R222 Localized swelling, mass and lump, trunk: Secondary | ICD-10-CM | POA: Diagnosis not present

## 2014-05-03 DIAGNOSIS — F17200 Nicotine dependence, unspecified, uncomplicated: Secondary | ICD-10-CM | POA: Diagnosis not present

## 2014-05-03 DIAGNOSIS — J45909 Unspecified asthma, uncomplicated: Secondary | ICD-10-CM | POA: Diagnosis not present

## 2014-06-20 DIAGNOSIS — R05 Cough: Secondary | ICD-10-CM | POA: Diagnosis not present

## 2014-06-20 DIAGNOSIS — F17200 Nicotine dependence, unspecified, uncomplicated: Secondary | ICD-10-CM | POA: Diagnosis not present

## 2014-06-20 DIAGNOSIS — R059 Cough, unspecified: Secondary | ICD-10-CM | POA: Diagnosis not present

## 2014-06-20 DIAGNOSIS — IMO0002 Reserved for concepts with insufficient information to code with codable children: Secondary | ICD-10-CM | POA: Diagnosis not present

## 2014-07-01 DIAGNOSIS — F419 Anxiety disorder, unspecified: Secondary | ICD-10-CM | POA: Diagnosis not present

## 2014-07-01 DIAGNOSIS — J439 Emphysema, unspecified: Secondary | ICD-10-CM | POA: Diagnosis not present

## 2014-07-01 DIAGNOSIS — R05 Cough: Secondary | ICD-10-CM | POA: Diagnosis not present

## 2014-07-01 DIAGNOSIS — E876 Hypokalemia: Secondary | ICD-10-CM | POA: Diagnosis not present

## 2014-07-01 DIAGNOSIS — J449 Chronic obstructive pulmonary disease, unspecified: Secondary | ICD-10-CM | POA: Diagnosis not present

## 2014-07-01 DIAGNOSIS — R0600 Dyspnea, unspecified: Secondary | ICD-10-CM | POA: Diagnosis not present

## 2014-07-01 DIAGNOSIS — J45909 Unspecified asthma, uncomplicated: Secondary | ICD-10-CM | POA: Diagnosis not present

## 2014-07-01 DIAGNOSIS — D649 Anemia, unspecified: Secondary | ICD-10-CM | POA: Diagnosis not present

## 2014-07-01 DIAGNOSIS — Z79899 Other long term (current) drug therapy: Secondary | ICD-10-CM | POA: Diagnosis not present

## 2014-07-01 DIAGNOSIS — R918 Other nonspecific abnormal finding of lung field: Secondary | ICD-10-CM | POA: Diagnosis not present

## 2014-07-01 DIAGNOSIS — I10 Essential (primary) hypertension: Secondary | ICD-10-CM | POA: Diagnosis not present

## 2014-07-01 DIAGNOSIS — C3490 Malignant neoplasm of unspecified part of unspecified bronchus or lung: Secondary | ICD-10-CM | POA: Diagnosis not present

## 2014-07-01 DIAGNOSIS — F329 Major depressive disorder, single episode, unspecified: Secondary | ICD-10-CM | POA: Diagnosis not present

## 2014-07-01 DIAGNOSIS — R0602 Shortness of breath: Secondary | ICD-10-CM | POA: Diagnosis not present

## 2014-07-01 DIAGNOSIS — Z720 Tobacco use: Secondary | ICD-10-CM | POA: Diagnosis not present

## 2014-07-01 DIAGNOSIS — K219 Gastro-esophageal reflux disease without esophagitis: Secondary | ICD-10-CM | POA: Diagnosis not present

## 2014-07-03 DIAGNOSIS — Z0000 Encounter for general adult medical examination without abnormal findings: Secondary | ICD-10-CM | POA: Diagnosis not present

## 2014-07-03 DIAGNOSIS — R05 Cough: Secondary | ICD-10-CM | POA: Diagnosis not present

## 2014-07-03 DIAGNOSIS — Z139 Encounter for screening, unspecified: Secondary | ICD-10-CM | POA: Diagnosis not present

## 2014-07-03 DIAGNOSIS — Z1389 Encounter for screening for other disorder: Secondary | ICD-10-CM | POA: Diagnosis not present

## 2014-07-03 DIAGNOSIS — K219 Gastro-esophageal reflux disease without esophagitis: Secondary | ICD-10-CM | POA: Diagnosis not present

## 2014-07-03 DIAGNOSIS — E876 Hypokalemia: Secondary | ICD-10-CM | POA: Diagnosis not present

## 2014-08-05 DIAGNOSIS — S40812A Abrasion of left upper arm, initial encounter: Secondary | ICD-10-CM | POA: Diagnosis not present

## 2014-08-05 DIAGNOSIS — S20311A Abrasion of right front wall of thorax, initial encounter: Secondary | ICD-10-CM | POA: Diagnosis not present

## 2014-08-05 DIAGNOSIS — J449 Chronic obstructive pulmonary disease, unspecified: Secondary | ICD-10-CM | POA: Diagnosis not present

## 2014-08-05 DIAGNOSIS — F17210 Nicotine dependence, cigarettes, uncomplicated: Secondary | ICD-10-CM | POA: Diagnosis not present

## 2014-08-05 DIAGNOSIS — I10 Essential (primary) hypertension: Secondary | ICD-10-CM | POA: Diagnosis not present

## 2014-08-05 DIAGNOSIS — S2091XA Abrasion of unspecified parts of thorax, initial encounter: Secondary | ICD-10-CM | POA: Diagnosis not present

## 2014-08-09 DIAGNOSIS — S3992XA Unspecified injury of lower back, initial encounter: Secondary | ICD-10-CM | POA: Diagnosis not present

## 2014-08-09 DIAGNOSIS — W1839XA Other fall on same level, initial encounter: Secondary | ICD-10-CM | POA: Diagnosis not present

## 2014-08-09 DIAGNOSIS — R202 Paresthesia of skin: Secondary | ICD-10-CM | POA: Diagnosis not present

## 2014-08-09 DIAGNOSIS — I10 Essential (primary) hypertension: Secondary | ICD-10-CM | POA: Diagnosis not present

## 2014-08-09 DIAGNOSIS — S39012A Strain of muscle, fascia and tendon of lower back, initial encounter: Secondary | ICD-10-CM | POA: Diagnosis not present

## 2014-08-09 DIAGNOSIS — S335XXA Sprain of ligaments of lumbar spine, initial encounter: Secondary | ICD-10-CM | POA: Diagnosis not present

## 2014-08-09 DIAGNOSIS — F17210 Nicotine dependence, cigarettes, uncomplicated: Secondary | ICD-10-CM | POA: Diagnosis not present

## 2014-08-09 DIAGNOSIS — M5416 Radiculopathy, lumbar region: Secondary | ICD-10-CM | POA: Diagnosis not present

## 2014-08-09 DIAGNOSIS — E118 Type 2 diabetes mellitus with unspecified complications: Secondary | ICD-10-CM | POA: Diagnosis not present

## 2014-08-09 DIAGNOSIS — J45909 Unspecified asthma, uncomplicated: Secondary | ICD-10-CM | POA: Diagnosis not present

## 2014-08-09 DIAGNOSIS — M545 Low back pain: Secondary | ICD-10-CM | POA: Diagnosis not present

## 2014-08-11 DIAGNOSIS — M549 Dorsalgia, unspecified: Secondary | ICD-10-CM | POA: Diagnosis not present

## 2014-08-11 DIAGNOSIS — S3992XA Unspecified injury of lower back, initial encounter: Secondary | ICD-10-CM | POA: Diagnosis not present

## 2014-08-11 DIAGNOSIS — M5416 Radiculopathy, lumbar region: Secondary | ICD-10-CM | POA: Diagnosis not present

## 2014-08-11 DIAGNOSIS — M5137 Other intervertebral disc degeneration, lumbosacral region: Secondary | ICD-10-CM | POA: Diagnosis not present

## 2014-08-11 DIAGNOSIS — M545 Low back pain: Secondary | ICD-10-CM | POA: Diagnosis not present

## 2014-08-11 DIAGNOSIS — M6281 Muscle weakness (generalized): Secondary | ICD-10-CM | POA: Diagnosis not present

## 2014-08-11 DIAGNOSIS — R531 Weakness: Secondary | ICD-10-CM | POA: Diagnosis not present

## 2014-08-12 DIAGNOSIS — M545 Low back pain: Secondary | ICD-10-CM | POA: Diagnosis not present

## 2014-08-12 DIAGNOSIS — S3992XA Unspecified injury of lower back, initial encounter: Secondary | ICD-10-CM | POA: Diagnosis not present

## 2014-08-16 DIAGNOSIS — S3992XA Unspecified injury of lower back, initial encounter: Secondary | ICD-10-CM | POA: Diagnosis not present

## 2014-08-16 DIAGNOSIS — R0789 Other chest pain: Secondary | ICD-10-CM | POA: Diagnosis not present

## 2014-08-16 DIAGNOSIS — M545 Low back pain: Secondary | ICD-10-CM | POA: Diagnosis not present

## 2014-08-16 DIAGNOSIS — S299XXA Unspecified injury of thorax, initial encounter: Secondary | ICD-10-CM | POA: Diagnosis not present

## 2014-08-16 DIAGNOSIS — R0781 Pleurodynia: Secondary | ICD-10-CM | POA: Diagnosis not present

## 2014-08-21 DIAGNOSIS — Z6821 Body mass index (BMI) 21.0-21.9, adult: Secondary | ICD-10-CM | POA: Diagnosis not present

## 2014-08-21 DIAGNOSIS — M549 Dorsalgia, unspecified: Secondary | ICD-10-CM | POA: Diagnosis not present

## 2014-08-21 DIAGNOSIS — G8929 Other chronic pain: Secondary | ICD-10-CM | POA: Diagnosis not present

## 2014-08-26 DIAGNOSIS — Z923 Personal history of irradiation: Secondary | ICD-10-CM | POA: Diagnosis not present

## 2014-08-26 DIAGNOSIS — Z9221 Personal history of antineoplastic chemotherapy: Secondary | ICD-10-CM | POA: Diagnosis not present

## 2014-08-26 DIAGNOSIS — Z85118 Personal history of other malignant neoplasm of bronchus and lung: Secondary | ICD-10-CM | POA: Diagnosis not present

## 2014-08-26 DIAGNOSIS — Z08 Encounter for follow-up examination after completed treatment for malignant neoplasm: Secondary | ICD-10-CM | POA: Diagnosis not present

## 2014-08-26 DIAGNOSIS — C3411 Malignant neoplasm of upper lobe, right bronchus or lung: Secondary | ICD-10-CM | POA: Diagnosis not present

## 2014-08-28 DIAGNOSIS — J45909 Unspecified asthma, uncomplicated: Secondary | ICD-10-CM | POA: Diagnosis not present

## 2014-08-28 DIAGNOSIS — T2024XA Burn of second degree of nose (septum), initial encounter: Secondary | ICD-10-CM | POA: Diagnosis not present

## 2014-08-28 DIAGNOSIS — F17210 Nicotine dependence, cigarettes, uncomplicated: Secondary | ICD-10-CM | POA: Diagnosis not present

## 2014-08-28 DIAGNOSIS — T2000XA Burn of unspecified degree of head, face, and neck, unspecified site, initial encounter: Secondary | ICD-10-CM | POA: Diagnosis not present

## 2014-08-28 DIAGNOSIS — I10 Essential (primary) hypertension: Secondary | ICD-10-CM | POA: Diagnosis not present

## 2014-08-28 DIAGNOSIS — J449 Chronic obstructive pulmonary disease, unspecified: Secondary | ICD-10-CM | POA: Diagnosis not present

## 2014-08-28 DIAGNOSIS — Z7952 Long term (current) use of systemic steroids: Secondary | ICD-10-CM | POA: Diagnosis not present

## 2014-08-28 DIAGNOSIS — S0993XA Unspecified injury of face, initial encounter: Secondary | ICD-10-CM | POA: Diagnosis not present

## 2014-08-28 DIAGNOSIS — T2004XA Burn of unspecified degree of nose (septum), initial encounter: Secondary | ICD-10-CM | POA: Diagnosis not present

## 2014-08-28 DIAGNOSIS — N19 Unspecified kidney failure: Secondary | ICD-10-CM | POA: Diagnosis not present

## 2014-08-28 DIAGNOSIS — X141XXA Other contact with hot air and other hot gases, initial encounter: Secondary | ICD-10-CM | POA: Diagnosis not present

## 2014-08-28 DIAGNOSIS — E118 Type 2 diabetes mellitus with unspecified complications: Secondary | ICD-10-CM | POA: Diagnosis not present

## 2014-08-28 DIAGNOSIS — T2002XA Burn of unspecified degree of lip(s), initial encounter: Secondary | ICD-10-CM | POA: Diagnosis not present

## 2014-08-28 DIAGNOSIS — Z85118 Personal history of other malignant neoplasm of bronchus and lung: Secondary | ICD-10-CM | POA: Diagnosis not present

## 2014-09-05 DIAGNOSIS — C3411 Malignant neoplasm of upper lobe, right bronchus or lung: Secondary | ICD-10-CM | POA: Diagnosis not present

## 2014-09-05 DIAGNOSIS — F17210 Nicotine dependence, cigarettes, uncomplicated: Secondary | ICD-10-CM | POA: Diagnosis not present

## 2014-09-06 DIAGNOSIS — R112 Nausea with vomiting, unspecified: Secondary | ICD-10-CM | POA: Diagnosis not present

## 2014-09-06 DIAGNOSIS — R1084 Generalized abdominal pain: Secondary | ICD-10-CM | POA: Diagnosis not present

## 2014-09-06 DIAGNOSIS — I10 Essential (primary) hypertension: Secondary | ICD-10-CM | POA: Diagnosis not present

## 2014-09-06 DIAGNOSIS — K8020 Calculus of gallbladder without cholecystitis without obstruction: Secondary | ICD-10-CM | POA: Diagnosis not present

## 2014-09-06 DIAGNOSIS — N200 Calculus of kidney: Secondary | ICD-10-CM | POA: Diagnosis not present

## 2014-09-06 DIAGNOSIS — N2889 Other specified disorders of kidney and ureter: Secondary | ICD-10-CM | POA: Diagnosis not present

## 2014-09-06 DIAGNOSIS — E119 Type 2 diabetes mellitus without complications: Secondary | ICD-10-CM | POA: Diagnosis not present

## 2014-09-06 DIAGNOSIS — Z7952 Long term (current) use of systemic steroids: Secondary | ICD-10-CM | POA: Diagnosis not present

## 2014-09-06 DIAGNOSIS — F17210 Nicotine dependence, cigarettes, uncomplicated: Secondary | ICD-10-CM | POA: Diagnosis not present

## 2014-09-07 DIAGNOSIS — N200 Calculus of kidney: Secondary | ICD-10-CM | POA: Diagnosis not present

## 2014-09-07 DIAGNOSIS — K5090 Crohn's disease, unspecified, without complications: Secondary | ICD-10-CM | POA: Diagnosis not present

## 2014-09-07 DIAGNOSIS — J449 Chronic obstructive pulmonary disease, unspecified: Secondary | ICD-10-CM | POA: Diagnosis not present

## 2014-09-07 DIAGNOSIS — R112 Nausea with vomiting, unspecified: Secondary | ICD-10-CM | POA: Diagnosis not present

## 2014-09-07 DIAGNOSIS — R1084 Generalized abdominal pain: Secondary | ICD-10-CM | POA: Diagnosis not present

## 2014-09-07 DIAGNOSIS — R0682 Tachypnea, not elsewhere classified: Secondary | ICD-10-CM | POA: Diagnosis not present

## 2014-09-07 DIAGNOSIS — R509 Fever, unspecified: Secondary | ICD-10-CM | POA: Diagnosis not present

## 2014-09-07 DIAGNOSIS — I10 Essential (primary) hypertension: Secondary | ICD-10-CM | POA: Diagnosis not present

## 2014-09-07 DIAGNOSIS — R000 Tachycardia, unspecified: Secondary | ICD-10-CM | POA: Diagnosis not present

## 2014-09-07 DIAGNOSIS — N19 Unspecified kidney failure: Secondary | ICD-10-CM | POA: Diagnosis not present

## 2014-09-07 DIAGNOSIS — R109 Unspecified abdominal pain: Secondary | ICD-10-CM | POA: Diagnosis not present

## 2014-09-07 DIAGNOSIS — K8020 Calculus of gallbladder without cholecystitis without obstruction: Secondary | ICD-10-CM | POA: Diagnosis not present

## 2014-09-07 DIAGNOSIS — F17210 Nicotine dependence, cigarettes, uncomplicated: Secondary | ICD-10-CM | POA: Diagnosis not present

## 2014-09-07 DIAGNOSIS — R1013 Epigastric pain: Secondary | ICD-10-CM | POA: Diagnosis not present

## 2014-09-07 DIAGNOSIS — Z85118 Personal history of other malignant neoplasm of bronchus and lung: Secondary | ICD-10-CM | POA: Diagnosis not present

## 2014-09-07 DIAGNOSIS — N2889 Other specified disorders of kidney and ureter: Secondary | ICD-10-CM | POA: Diagnosis not present

## 2014-09-09 DIAGNOSIS — I10 Essential (primary) hypertension: Secondary | ICD-10-CM | POA: Diagnosis not present

## 2014-09-09 DIAGNOSIS — N200 Calculus of kidney: Secondary | ICD-10-CM | POA: Diagnosis not present

## 2014-09-09 DIAGNOSIS — R1084 Generalized abdominal pain: Secondary | ICD-10-CM | POA: Diagnosis not present

## 2014-09-09 DIAGNOSIS — J449 Chronic obstructive pulmonary disease, unspecified: Secondary | ICD-10-CM | POA: Diagnosis not present

## 2014-09-09 DIAGNOSIS — R109 Unspecified abdominal pain: Secondary | ICD-10-CM | POA: Diagnosis not present

## 2014-09-09 DIAGNOSIS — Z7952 Long term (current) use of systemic steroids: Secondary | ICD-10-CM | POA: Diagnosis not present

## 2014-09-09 DIAGNOSIS — E118 Type 2 diabetes mellitus with unspecified complications: Secondary | ICD-10-CM | POA: Diagnosis not present

## 2014-09-09 DIAGNOSIS — R1030 Lower abdominal pain, unspecified: Secondary | ICD-10-CM | POA: Diagnosis not present

## 2014-09-09 DIAGNOSIS — R112 Nausea with vomiting, unspecified: Secondary | ICD-10-CM | POA: Diagnosis not present

## 2014-09-09 DIAGNOSIS — J45909 Unspecified asthma, uncomplicated: Secondary | ICD-10-CM | POA: Diagnosis not present

## 2014-09-09 DIAGNOSIS — N19 Unspecified kidney failure: Secondary | ICD-10-CM | POA: Diagnosis not present

## 2014-09-09 DIAGNOSIS — F17210 Nicotine dependence, cigarettes, uncomplicated: Secondary | ICD-10-CM | POA: Diagnosis not present

## 2014-09-11 DIAGNOSIS — F17210 Nicotine dependence, cigarettes, uncomplicated: Secondary | ICD-10-CM | POA: Diagnosis not present

## 2014-09-11 DIAGNOSIS — R197 Diarrhea, unspecified: Secondary | ICD-10-CM | POA: Diagnosis not present

## 2014-09-11 DIAGNOSIS — R1084 Generalized abdominal pain: Secondary | ICD-10-CM | POA: Diagnosis not present

## 2014-09-11 DIAGNOSIS — C3490 Malignant neoplasm of unspecified part of unspecified bronchus or lung: Secondary | ICD-10-CM | POA: Diagnosis not present

## 2014-09-11 DIAGNOSIS — K8020 Calculus of gallbladder without cholecystitis without obstruction: Secondary | ICD-10-CM | POA: Diagnosis not present

## 2014-09-11 DIAGNOSIS — M797 Fibromyalgia: Secondary | ICD-10-CM | POA: Diagnosis not present

## 2014-09-11 DIAGNOSIS — G8929 Other chronic pain: Secondary | ICD-10-CM | POA: Diagnosis not present

## 2014-09-11 DIAGNOSIS — N202 Calculus of kidney with calculus of ureter: Secondary | ICD-10-CM | POA: Diagnosis not present

## 2014-09-11 DIAGNOSIS — J449 Chronic obstructive pulmonary disease, unspecified: Secondary | ICD-10-CM | POA: Diagnosis not present

## 2014-09-11 DIAGNOSIS — R1031 Right lower quadrant pain: Secondary | ICD-10-CM | POA: Diagnosis not present

## 2014-09-11 DIAGNOSIS — R112 Nausea with vomiting, unspecified: Secondary | ICD-10-CM | POA: Diagnosis not present

## 2014-09-14 DIAGNOSIS — R0602 Shortness of breath: Secondary | ICD-10-CM | POA: Diagnosis not present

## 2014-09-14 DIAGNOSIS — R072 Precordial pain: Secondary | ICD-10-CM | POA: Diagnosis not present

## 2014-09-14 DIAGNOSIS — R000 Tachycardia, unspecified: Secondary | ICD-10-CM | POA: Diagnosis not present

## 2014-09-14 DIAGNOSIS — R109 Unspecified abdominal pain: Secondary | ICD-10-CM | POA: Diagnosis not present

## 2014-09-14 DIAGNOSIS — C3490 Malignant neoplasm of unspecified part of unspecified bronchus or lung: Secondary | ICD-10-CM | POA: Diagnosis not present

## 2014-09-14 DIAGNOSIS — N179 Acute kidney failure, unspecified: Secondary | ICD-10-CM | POA: Diagnosis not present

## 2014-09-14 DIAGNOSIS — R079 Chest pain, unspecified: Secondary | ICD-10-CM | POA: Diagnosis not present

## 2014-09-15 DIAGNOSIS — N200 Calculus of kidney: Secondary | ICD-10-CM | POA: Diagnosis not present

## 2014-09-15 DIAGNOSIS — R079 Chest pain, unspecified: Secondary | ICD-10-CM | POA: Diagnosis not present

## 2014-09-15 DIAGNOSIS — R109 Unspecified abdominal pain: Secondary | ICD-10-CM | POA: Diagnosis not present

## 2014-09-15 DIAGNOSIS — I471 Supraventricular tachycardia: Secondary | ICD-10-CM | POA: Diagnosis not present

## 2014-09-15 DIAGNOSIS — R0789 Other chest pain: Secondary | ICD-10-CM | POA: Diagnosis not present

## 2014-09-15 DIAGNOSIS — R000 Tachycardia, unspecified: Secondary | ICD-10-CM | POA: Diagnosis not present

## 2014-09-16 DIAGNOSIS — K5090 Crohn's disease, unspecified, without complications: Secondary | ICD-10-CM | POA: Diagnosis not present

## 2014-09-16 DIAGNOSIS — R109 Unspecified abdominal pain: Secondary | ICD-10-CM | POA: Diagnosis not present

## 2014-09-16 DIAGNOSIS — R000 Tachycardia, unspecified: Secondary | ICD-10-CM | POA: Diagnosis not present

## 2014-09-16 DIAGNOSIS — N23 Unspecified renal colic: Secondary | ICD-10-CM | POA: Diagnosis not present

## 2014-09-16 DIAGNOSIS — C3490 Malignant neoplasm of unspecified part of unspecified bronchus or lung: Secondary | ICD-10-CM | POA: Diagnosis not present

## 2014-09-17 DIAGNOSIS — K922 Gastrointestinal hemorrhage, unspecified: Secondary | ICD-10-CM | POA: Diagnosis not present

## 2014-09-17 DIAGNOSIS — K5090 Crohn's disease, unspecified, without complications: Secondary | ICD-10-CM | POA: Diagnosis not present

## 2014-09-17 DIAGNOSIS — R000 Tachycardia, unspecified: Secondary | ICD-10-CM | POA: Diagnosis not present

## 2014-09-17 DIAGNOSIS — K263 Acute duodenal ulcer without hemorrhage or perforation: Secondary | ICD-10-CM | POA: Diagnosis not present

## 2014-09-17 DIAGNOSIS — C3490 Malignant neoplasm of unspecified part of unspecified bronchus or lung: Secondary | ICD-10-CM | POA: Diagnosis not present

## 2014-09-17 DIAGNOSIS — D649 Anemia, unspecified: Secondary | ICD-10-CM | POA: Diagnosis not present

## 2014-09-17 DIAGNOSIS — R109 Unspecified abdominal pain: Secondary | ICD-10-CM | POA: Diagnosis not present

## 2014-09-18 DIAGNOSIS — K263 Acute duodenal ulcer without hemorrhage or perforation: Secondary | ICD-10-CM | POA: Diagnosis not present

## 2014-09-18 DIAGNOSIS — N179 Acute kidney failure, unspecified: Secondary | ICD-10-CM | POA: Diagnosis not present

## 2014-09-18 DIAGNOSIS — D649 Anemia, unspecified: Secondary | ICD-10-CM | POA: Diagnosis not present

## 2014-09-18 DIAGNOSIS — K922 Gastrointestinal hemorrhage, unspecified: Secondary | ICD-10-CM | POA: Diagnosis not present

## 2014-09-18 DIAGNOSIS — K5090 Crohn's disease, unspecified, without complications: Secondary | ICD-10-CM | POA: Diagnosis not present

## 2014-09-24 DIAGNOSIS — K219 Gastro-esophageal reflux disease without esophagitis: Secondary | ICD-10-CM | POA: Diagnosis not present

## 2014-09-24 DIAGNOSIS — K253 Acute gastric ulcer without hemorrhage or perforation: Secondary | ICD-10-CM | POA: Diagnosis not present

## 2014-09-24 DIAGNOSIS — C3490 Malignant neoplasm of unspecified part of unspecified bronchus or lung: Secondary | ICD-10-CM | POA: Diagnosis not present

## 2014-09-24 DIAGNOSIS — J449 Chronic obstructive pulmonary disease, unspecified: Secondary | ICD-10-CM | POA: Diagnosis not present

## 2014-09-25 DIAGNOSIS — J449 Chronic obstructive pulmonary disease, unspecified: Secondary | ICD-10-CM | POA: Diagnosis not present

## 2014-09-25 DIAGNOSIS — R1013 Epigastric pain: Secondary | ICD-10-CM | POA: Diagnosis not present

## 2014-09-25 DIAGNOSIS — R10819 Abdominal tenderness, unspecified site: Secondary | ICD-10-CM | POA: Diagnosis not present

## 2014-09-25 DIAGNOSIS — I10 Essential (primary) hypertension: Secondary | ICD-10-CM | POA: Diagnosis not present

## 2014-09-25 DIAGNOSIS — J45909 Unspecified asthma, uncomplicated: Secondary | ICD-10-CM | POA: Diagnosis not present

## 2014-09-25 DIAGNOSIS — N19 Unspecified kidney failure: Secondary | ICD-10-CM | POA: Diagnosis not present

## 2014-09-25 DIAGNOSIS — J984 Other disorders of lung: Secondary | ICD-10-CM | POA: Diagnosis not present

## 2014-09-25 DIAGNOSIS — K2970 Gastritis, unspecified, without bleeding: Secondary | ICD-10-CM | POA: Diagnosis not present

## 2014-09-25 DIAGNOSIS — F17210 Nicotine dependence, cigarettes, uncomplicated: Secondary | ICD-10-CM | POA: Diagnosis not present

## 2014-10-07 DIAGNOSIS — J45909 Unspecified asthma, uncomplicated: Secondary | ICD-10-CM | POA: Diagnosis not present

## 2014-10-07 DIAGNOSIS — J984 Other disorders of lung: Secondary | ICD-10-CM | POA: Diagnosis not present

## 2014-10-07 DIAGNOSIS — Z85118 Personal history of other malignant neoplasm of bronchus and lung: Secondary | ICD-10-CM | POA: Diagnosis not present

## 2014-10-07 DIAGNOSIS — Z7952 Long term (current) use of systemic steroids: Secondary | ICD-10-CM | POA: Diagnosis not present

## 2014-10-07 DIAGNOSIS — F17210 Nicotine dependence, cigarettes, uncomplicated: Secondary | ICD-10-CM | POA: Diagnosis not present

## 2014-10-07 DIAGNOSIS — J441 Chronic obstructive pulmonary disease with (acute) exacerbation: Secondary | ICD-10-CM | POA: Diagnosis not present

## 2014-10-07 DIAGNOSIS — Z792 Long term (current) use of antibiotics: Secondary | ICD-10-CM | POA: Diagnosis not present

## 2014-10-07 DIAGNOSIS — I10 Essential (primary) hypertension: Secondary | ICD-10-CM | POA: Diagnosis not present

## 2014-10-12 DIAGNOSIS — R112 Nausea with vomiting, unspecified: Secondary | ICD-10-CM | POA: Diagnosis not present

## 2014-10-12 DIAGNOSIS — K2970 Gastritis, unspecified, without bleeding: Secondary | ICD-10-CM | POA: Diagnosis not present

## 2014-10-13 DIAGNOSIS — J986 Disorders of diaphragm: Secondary | ICD-10-CM | POA: Diagnosis not present

## 2014-10-13 DIAGNOSIS — R079 Chest pain, unspecified: Secondary | ICD-10-CM | POA: Diagnosis not present

## 2014-10-13 DIAGNOSIS — R071 Chest pain on breathing: Secondary | ICD-10-CM | POA: Diagnosis not present

## 2014-10-13 DIAGNOSIS — N289 Disorder of kidney and ureter, unspecified: Secondary | ICD-10-CM | POA: Diagnosis not present

## 2014-10-13 DIAGNOSIS — R072 Precordial pain: Secondary | ICD-10-CM | POA: Diagnosis not present

## 2014-10-13 DIAGNOSIS — I2510 Atherosclerotic heart disease of native coronary artery without angina pectoris: Secondary | ICD-10-CM | POA: Diagnosis not present

## 2014-10-13 DIAGNOSIS — Z85118 Personal history of other malignant neoplasm of bronchus and lung: Secondary | ICD-10-CM | POA: Diagnosis not present

## 2014-10-13 DIAGNOSIS — J984 Other disorders of lung: Secondary | ICD-10-CM | POA: Diagnosis not present

## 2014-10-13 DIAGNOSIS — R05 Cough: Secondary | ICD-10-CM | POA: Diagnosis not present

## 2014-10-13 DIAGNOSIS — I10 Essential (primary) hypertension: Secondary | ICD-10-CM | POA: Diagnosis not present

## 2014-10-13 DIAGNOSIS — R093 Abnormal sputum: Secondary | ICD-10-CM | POA: Diagnosis not present

## 2014-10-13 DIAGNOSIS — K5090 Crohn's disease, unspecified, without complications: Secondary | ICD-10-CM | POA: Diagnosis not present

## 2014-10-13 DIAGNOSIS — F17210 Nicotine dependence, cigarettes, uncomplicated: Secondary | ICD-10-CM | POA: Diagnosis not present

## 2014-10-13 DIAGNOSIS — R0602 Shortness of breath: Secondary | ICD-10-CM | POA: Diagnosis not present

## 2014-10-13 DIAGNOSIS — G8929 Other chronic pain: Secondary | ICD-10-CM | POA: Diagnosis not present

## 2014-10-13 DIAGNOSIS — J449 Chronic obstructive pulmonary disease, unspecified: Secondary | ICD-10-CM | POA: Diagnosis not present

## 2014-11-07 DIAGNOSIS — F419 Anxiety disorder, unspecified: Secondary | ICD-10-CM | POA: Diagnosis not present

## 2014-11-07 DIAGNOSIS — N132 Hydronephrosis with renal and ureteral calculous obstruction: Secondary | ICD-10-CM | POA: Diagnosis not present

## 2014-11-07 DIAGNOSIS — R1084 Generalized abdominal pain: Secondary | ICD-10-CM | POA: Diagnosis not present

## 2014-11-07 DIAGNOSIS — F329 Major depressive disorder, single episode, unspecified: Secondary | ICD-10-CM | POA: Diagnosis not present

## 2014-11-07 DIAGNOSIS — R110 Nausea: Secondary | ICD-10-CM | POA: Diagnosis not present

## 2014-11-07 DIAGNOSIS — E119 Type 2 diabetes mellitus without complications: Secondary | ICD-10-CM | POA: Diagnosis not present

## 2014-11-07 DIAGNOSIS — N200 Calculus of kidney: Secondary | ICD-10-CM | POA: Diagnosis not present

## 2014-11-07 DIAGNOSIS — K8020 Calculus of gallbladder without cholecystitis without obstruction: Secondary | ICD-10-CM | POA: Diagnosis not present

## 2014-11-07 DIAGNOSIS — F17210 Nicotine dependence, cigarettes, uncomplicated: Secondary | ICD-10-CM | POA: Diagnosis not present

## 2014-11-07 DIAGNOSIS — I10 Essential (primary) hypertension: Secondary | ICD-10-CM | POA: Diagnosis not present

## 2014-11-07 DIAGNOSIS — M1990 Unspecified osteoarthritis, unspecified site: Secondary | ICD-10-CM | POA: Diagnosis not present

## 2014-11-07 DIAGNOSIS — N210 Calculus in bladder: Secondary | ICD-10-CM | POA: Diagnosis not present

## 2014-11-12 DIAGNOSIS — R0600 Dyspnea, unspecified: Secondary | ICD-10-CM | POA: Diagnosis not present

## 2014-11-12 DIAGNOSIS — K8020 Calculus of gallbladder without cholecystitis without obstruction: Secondary | ICD-10-CM | POA: Diagnosis not present

## 2014-11-12 DIAGNOSIS — R112 Nausea with vomiting, unspecified: Secondary | ICD-10-CM | POA: Diagnosis not present

## 2014-11-12 DIAGNOSIS — J986 Disorders of diaphragm: Secondary | ICD-10-CM | POA: Diagnosis not present

## 2014-11-12 DIAGNOSIS — R918 Other nonspecific abnormal finding of lung field: Secondary | ICD-10-CM | POA: Diagnosis not present

## 2014-11-12 DIAGNOSIS — K2970 Gastritis, unspecified, without bleeding: Secondary | ICD-10-CM | POA: Diagnosis not present

## 2014-11-12 DIAGNOSIS — J069 Acute upper respiratory infection, unspecified: Secondary | ICD-10-CM | POA: Diagnosis not present

## 2014-11-14 DIAGNOSIS — R0600 Dyspnea, unspecified: Secondary | ICD-10-CM | POA: Diagnosis not present

## 2014-11-14 DIAGNOSIS — Z792 Long term (current) use of antibiotics: Secondary | ICD-10-CM | POA: Diagnosis not present

## 2014-11-14 DIAGNOSIS — M1990 Unspecified osteoarthritis, unspecified site: Secondary | ICD-10-CM | POA: Diagnosis not present

## 2014-11-14 DIAGNOSIS — R0602 Shortness of breath: Secondary | ICD-10-CM | POA: Diagnosis not present

## 2014-11-14 DIAGNOSIS — J984 Other disorders of lung: Secondary | ICD-10-CM | POA: Diagnosis not present

## 2014-11-14 DIAGNOSIS — I10 Essential (primary) hypertension: Secondary | ICD-10-CM | POA: Diagnosis not present

## 2014-11-14 DIAGNOSIS — I509 Heart failure, unspecified: Secondary | ICD-10-CM | POA: Diagnosis not present

## 2014-11-14 DIAGNOSIS — R5383 Other fatigue: Secondary | ICD-10-CM | POA: Diagnosis not present

## 2014-11-14 DIAGNOSIS — G8929 Other chronic pain: Secondary | ICD-10-CM | POA: Diagnosis not present

## 2014-11-14 DIAGNOSIS — F17210 Nicotine dependence, cigarettes, uncomplicated: Secondary | ICD-10-CM | POA: Diagnosis not present

## 2014-11-14 DIAGNOSIS — E119 Type 2 diabetes mellitus without complications: Secondary | ICD-10-CM | POA: Diagnosis not present

## 2014-11-14 DIAGNOSIS — R531 Weakness: Secondary | ICD-10-CM | POA: Diagnosis not present

## 2014-11-14 DIAGNOSIS — E780 Pure hypercholesterolemia: Secondary | ICD-10-CM | POA: Diagnosis not present

## 2015-01-14 DIAGNOSIS — C3411 Malignant neoplasm of upper lobe, right bronchus or lung: Secondary | ICD-10-CM | POA: Diagnosis not present

## 2015-03-01 DIAGNOSIS — J189 Pneumonia, unspecified organism: Secondary | ICD-10-CM | POA: Diagnosis not present

## 2015-03-01 DIAGNOSIS — E876 Hypokalemia: Secondary | ICD-10-CM | POA: Diagnosis not present

## 2015-03-01 DIAGNOSIS — R0602 Shortness of breath: Secondary | ICD-10-CM | POA: Diagnosis not present

## 2015-03-01 DIAGNOSIS — R05 Cough: Secondary | ICD-10-CM | POA: Diagnosis not present

## 2015-03-12 DIAGNOSIS — J439 Emphysema, unspecified: Secondary | ICD-10-CM | POA: Diagnosis not present

## 2015-03-12 DIAGNOSIS — N200 Calculus of kidney: Secondary | ICD-10-CM | POA: Diagnosis not present

## 2015-03-12 DIAGNOSIS — J984 Other disorders of lung: Secondary | ICD-10-CM | POA: Diagnosis not present

## 2015-03-12 DIAGNOSIS — C3491 Malignant neoplasm of unspecified part of right bronchus or lung: Secondary | ICD-10-CM | POA: Diagnosis not present

## 2015-03-12 DIAGNOSIS — C3411 Malignant neoplasm of upper lobe, right bronchus or lung: Secondary | ICD-10-CM | POA: Diagnosis not present

## 2015-03-13 DIAGNOSIS — Z85118 Personal history of other malignant neoplasm of bronchus and lung: Secondary | ICD-10-CM | POA: Diagnosis not present

## 2015-03-17 DIAGNOSIS — R600 Localized edema: Secondary | ICD-10-CM | POA: Diagnosis not present

## 2015-03-17 DIAGNOSIS — R0602 Shortness of breath: Secondary | ICD-10-CM | POA: Diagnosis not present

## 2015-03-17 DIAGNOSIS — Z681 Body mass index (BMI) 19 or less, adult: Secondary | ICD-10-CM | POA: Diagnosis not present

## 2015-03-17 DIAGNOSIS — R609 Edema, unspecified: Secondary | ICD-10-CM | POA: Diagnosis not present

## 2015-03-17 DIAGNOSIS — J189 Pneumonia, unspecified organism: Secondary | ICD-10-CM | POA: Diagnosis not present

## 2015-03-17 DIAGNOSIS — R109 Unspecified abdominal pain: Secondary | ICD-10-CM | POA: Diagnosis not present

## 2015-03-17 DIAGNOSIS — R197 Diarrhea, unspecified: Secondary | ICD-10-CM | POA: Diagnosis not present

## 2015-03-17 DIAGNOSIS — E876 Hypokalemia: Secondary | ICD-10-CM | POA: Diagnosis not present

## 2015-03-20 DIAGNOSIS — Z681 Body mass index (BMI) 19 or less, adult: Secondary | ICD-10-CM | POA: Diagnosis not present

## 2015-03-20 DIAGNOSIS — R609 Edema, unspecified: Secondary | ICD-10-CM | POA: Diagnosis not present

## 2015-03-20 DIAGNOSIS — G8929 Other chronic pain: Secondary | ICD-10-CM | POA: Diagnosis not present

## 2015-03-25 DIAGNOSIS — R600 Localized edema: Secondary | ICD-10-CM | POA: Diagnosis not present

## 2015-03-25 DIAGNOSIS — R197 Diarrhea, unspecified: Secondary | ICD-10-CM | POA: Diagnosis not present

## 2015-03-25 DIAGNOSIS — M545 Low back pain: Secondary | ICD-10-CM | POA: Diagnosis not present

## 2015-03-25 DIAGNOSIS — F17210 Nicotine dependence, cigarettes, uncomplicated: Secondary | ICD-10-CM | POA: Diagnosis not present

## 2015-03-26 DIAGNOSIS — Z86718 Personal history of other venous thrombosis and embolism: Secondary | ICD-10-CM | POA: Diagnosis not present

## 2015-03-26 DIAGNOSIS — I739 Peripheral vascular disease, unspecified: Secondary | ICD-10-CM | POA: Diagnosis not present

## 2015-03-26 DIAGNOSIS — F17200 Nicotine dependence, unspecified, uncomplicated: Secondary | ICD-10-CM | POA: Diagnosis not present

## 2015-03-26 DIAGNOSIS — R609 Edema, unspecified: Secondary | ICD-10-CM | POA: Diagnosis not present

## 2015-03-27 DIAGNOSIS — I739 Peripheral vascular disease, unspecified: Secondary | ICD-10-CM | POA: Diagnosis not present

## 2015-04-03 DIAGNOSIS — M79605 Pain in left leg: Secondary | ICD-10-CM | POA: Diagnosis not present

## 2015-04-03 DIAGNOSIS — M79662 Pain in left lower leg: Secondary | ICD-10-CM | POA: Diagnosis not present

## 2015-04-03 DIAGNOSIS — M79661 Pain in right lower leg: Secondary | ICD-10-CM | POA: Diagnosis not present

## 2015-04-03 DIAGNOSIS — Z86718 Personal history of other venous thrombosis and embolism: Secondary | ICD-10-CM | POA: Diagnosis not present

## 2015-04-03 DIAGNOSIS — M79604 Pain in right leg: Secondary | ICD-10-CM | POA: Diagnosis not present

## 2015-04-03 DIAGNOSIS — R609 Edema, unspecified: Secondary | ICD-10-CM | POA: Diagnosis not present

## 2015-05-13 DIAGNOSIS — R1030 Lower abdominal pain, unspecified: Secondary | ICD-10-CM | POA: Diagnosis not present

## 2015-05-13 DIAGNOSIS — R0602 Shortness of breath: Secondary | ICD-10-CM | POA: Diagnosis not present

## 2015-05-13 DIAGNOSIS — J449 Chronic obstructive pulmonary disease, unspecified: Secondary | ICD-10-CM | POA: Diagnosis not present

## 2015-05-13 DIAGNOSIS — R05 Cough: Secondary | ICD-10-CM | POA: Diagnosis not present

## 2015-07-15 DIAGNOSIS — R109 Unspecified abdominal pain: Secondary | ICD-10-CM | POA: Diagnosis not present

## 2015-07-24 DIAGNOSIS — K5090 Crohn's disease, unspecified, without complications: Secondary | ICD-10-CM | POA: Diagnosis not present

## 2015-07-24 DIAGNOSIS — M79671 Pain in right foot: Secondary | ICD-10-CM | POA: Diagnosis not present

## 2015-07-24 DIAGNOSIS — Z6821 Body mass index (BMI) 21.0-21.9, adult: Secondary | ICD-10-CM | POA: Diagnosis not present

## 2015-08-14 DIAGNOSIS — M545 Low back pain: Secondary | ICD-10-CM | POA: Diagnosis not present

## 2015-09-01 DIAGNOSIS — K529 Noninfective gastroenteritis and colitis, unspecified: Secondary | ICD-10-CM | POA: Diagnosis not present

## 2015-09-01 DIAGNOSIS — R0602 Shortness of breath: Secondary | ICD-10-CM | POA: Diagnosis not present

## 2015-09-01 DIAGNOSIS — R197 Diarrhea, unspecified: Secondary | ICD-10-CM | POA: Diagnosis not present

## 2015-09-01 DIAGNOSIS — R109 Unspecified abdominal pain: Secondary | ICD-10-CM | POA: Diagnosis not present

## 2015-09-01 DIAGNOSIS — R05 Cough: Secondary | ICD-10-CM | POA: Diagnosis not present

## 2015-11-13 DIAGNOSIS — J441 Chronic obstructive pulmonary disease with (acute) exacerbation: Secondary | ICD-10-CM | POA: Diagnosis not present

## 2015-11-13 DIAGNOSIS — R0602 Shortness of breath: Secondary | ICD-10-CM | POA: Diagnosis not present

## 2015-12-30 DIAGNOSIS — R05 Cough: Secondary | ICD-10-CM | POA: Diagnosis not present

## 2015-12-30 DIAGNOSIS — R42 Dizziness and giddiness: Secondary | ICD-10-CM | POA: Diagnosis not present

## 2015-12-30 DIAGNOSIS — J209 Acute bronchitis, unspecified: Secondary | ICD-10-CM | POA: Diagnosis not present

## 2016-01-06 DIAGNOSIS — R531 Weakness: Secondary | ICD-10-CM | POA: Diagnosis not present

## 2016-01-06 DIAGNOSIS — Z85118 Personal history of other malignant neoplasm of bronchus and lung: Secondary | ICD-10-CM | POA: Diagnosis not present

## 2016-01-06 DIAGNOSIS — F17210 Nicotine dependence, cigarettes, uncomplicated: Secondary | ICD-10-CM | POA: Diagnosis not present

## 2016-03-10 DIAGNOSIS — R319 Hematuria, unspecified: Secondary | ICD-10-CM | POA: Diagnosis not present

## 2016-03-10 DIAGNOSIS — F17200 Nicotine dependence, unspecified, uncomplicated: Secondary | ICD-10-CM | POA: Diagnosis not present

## 2016-03-10 DIAGNOSIS — Z125 Encounter for screening for malignant neoplasm of prostate: Secondary | ICD-10-CM | POA: Diagnosis not present

## 2016-03-10 DIAGNOSIS — J441 Chronic obstructive pulmonary disease with (acute) exacerbation: Secondary | ICD-10-CM | POA: Diagnosis not present

## 2016-03-10 DIAGNOSIS — R634 Abnormal weight loss: Secondary | ICD-10-CM | POA: Diagnosis not present

## 2016-03-11 DIAGNOSIS — R0602 Shortness of breath: Secondary | ICD-10-CM | POA: Diagnosis not present

## 2016-03-11 DIAGNOSIS — R05 Cough: Secondary | ICD-10-CM | POA: Diagnosis not present

## 2016-03-17 DIAGNOSIS — E876 Hypokalemia: Secondary | ICD-10-CM | POA: Diagnosis not present

## 2016-03-17 DIAGNOSIS — E874 Mixed disorder of acid-base balance: Secondary | ICD-10-CM | POA: Diagnosis not present

## 2016-03-17 DIAGNOSIS — Z1389 Encounter for screening for other disorder: Secondary | ICD-10-CM | POA: Diagnosis not present

## 2016-03-17 DIAGNOSIS — Z139 Encounter for screening, unspecified: Secondary | ICD-10-CM | POA: Diagnosis not present

## 2016-03-17 DIAGNOSIS — Z0000 Encounter for general adult medical examination without abnormal findings: Secondary | ICD-10-CM | POA: Diagnosis not present

## 2016-03-17 DIAGNOSIS — R634 Abnormal weight loss: Secondary | ICD-10-CM | POA: Diagnosis not present

## 2016-03-24 DIAGNOSIS — R3129 Other microscopic hematuria: Secondary | ICD-10-CM | POA: Diagnosis not present

## 2016-03-24 DIAGNOSIS — N200 Calculus of kidney: Secondary | ICD-10-CM | POA: Diagnosis not present

## 2016-03-24 DIAGNOSIS — N261 Atrophy of kidney (terminal): Secondary | ICD-10-CM | POA: Diagnosis not present

## 2016-03-25 DIAGNOSIS — M5416 Radiculopathy, lumbar region: Secondary | ICD-10-CM | POA: Diagnosis not present

## 2016-03-25 DIAGNOSIS — R531 Weakness: Secondary | ICD-10-CM | POA: Diagnosis not present

## 2016-03-25 DIAGNOSIS — M545 Low back pain: Secondary | ICD-10-CM | POA: Diagnosis not present

## 2016-03-25 DIAGNOSIS — E876 Hypokalemia: Secondary | ICD-10-CM | POA: Diagnosis not present

## 2016-04-02 DIAGNOSIS — N261 Atrophy of kidney (terminal): Secondary | ICD-10-CM | POA: Diagnosis not present

## 2016-04-02 DIAGNOSIS — N1330 Unspecified hydronephrosis: Secondary | ICD-10-CM | POA: Diagnosis not present

## 2016-04-02 DIAGNOSIS — N200 Calculus of kidney: Secondary | ICD-10-CM | POA: Diagnosis not present

## 2016-04-07 DIAGNOSIS — R3129 Other microscopic hematuria: Secondary | ICD-10-CM | POA: Diagnosis not present

## 2016-04-07 DIAGNOSIS — N200 Calculus of kidney: Secondary | ICD-10-CM | POA: Diagnosis not present

## 2016-04-15 DIAGNOSIS — Z681 Body mass index (BMI) 19 or less, adult: Secondary | ICD-10-CM | POA: Diagnosis not present

## 2016-04-15 DIAGNOSIS — M79671 Pain in right foot: Secondary | ICD-10-CM | POA: Diagnosis not present

## 2016-04-15 DIAGNOSIS — G4700 Insomnia, unspecified: Secondary | ICD-10-CM | POA: Diagnosis not present

## 2016-04-15 DIAGNOSIS — J449 Chronic obstructive pulmonary disease, unspecified: Secondary | ICD-10-CM | POA: Diagnosis not present

## 2016-04-20 DIAGNOSIS — G8929 Other chronic pain: Secondary | ICD-10-CM | POA: Diagnosis not present

## 2016-04-20 DIAGNOSIS — N200 Calculus of kidney: Secondary | ICD-10-CM | POA: Diagnosis not present

## 2016-04-20 DIAGNOSIS — M545 Low back pain: Secondary | ICD-10-CM | POA: Diagnosis not present

## 2016-04-22 DIAGNOSIS — Z6820 Body mass index (BMI) 20.0-20.9, adult: Secondary | ICD-10-CM | POA: Diagnosis not present

## 2016-04-22 DIAGNOSIS — F17200 Nicotine dependence, unspecified, uncomplicated: Secondary | ICD-10-CM | POA: Diagnosis not present

## 2016-04-22 DIAGNOSIS — J449 Chronic obstructive pulmonary disease, unspecified: Secondary | ICD-10-CM | POA: Diagnosis not present

## 2016-04-26 DIAGNOSIS — R609 Edema, unspecified: Secondary | ICD-10-CM | POA: Diagnosis not present

## 2016-04-26 DIAGNOSIS — Z6820 Body mass index (BMI) 20.0-20.9, adult: Secondary | ICD-10-CM | POA: Diagnosis not present

## 2016-05-17 DIAGNOSIS — M79671 Pain in right foot: Secondary | ICD-10-CM | POA: Diagnosis not present

## 2016-05-17 DIAGNOSIS — R0989 Other specified symptoms and signs involving the circulatory and respiratory systems: Secondary | ICD-10-CM | POA: Diagnosis not present

## 2016-05-17 DIAGNOSIS — M79672 Pain in left foot: Secondary | ICD-10-CM | POA: Diagnosis not present

## 2016-05-17 DIAGNOSIS — F17200 Nicotine dependence, unspecified, uncomplicated: Secondary | ICD-10-CM | POA: Diagnosis not present

## 2016-06-08 DIAGNOSIS — Z681 Body mass index (BMI) 19 or less, adult: Secondary | ICD-10-CM | POA: Diagnosis not present

## 2016-06-08 DIAGNOSIS — G629 Polyneuropathy, unspecified: Secondary | ICD-10-CM | POA: Diagnosis not present

## 2016-06-14 DIAGNOSIS — R42 Dizziness and giddiness: Secondary | ICD-10-CM | POA: Diagnosis not present

## 2016-06-14 DIAGNOSIS — Z5321 Procedure and treatment not carried out due to patient leaving prior to being seen by health care provider: Secondary | ICD-10-CM | POA: Diagnosis not present

## 2016-06-14 DIAGNOSIS — R531 Weakness: Secondary | ICD-10-CM | POA: Diagnosis not present

## 2016-06-14 DIAGNOSIS — R05 Cough: Secondary | ICD-10-CM | POA: Diagnosis not present

## 2016-06-14 DIAGNOSIS — R079 Chest pain, unspecified: Secondary | ICD-10-CM | POA: Diagnosis not present

## 2016-08-25 DIAGNOSIS — R0989 Other specified symptoms and signs involving the circulatory and respiratory systems: Secondary | ICD-10-CM | POA: Diagnosis not present

## 2016-09-02 DIAGNOSIS — R0989 Other specified symptoms and signs involving the circulatory and respiratory systems: Secondary | ICD-10-CM | POA: Diagnosis not present

## 2016-09-02 DIAGNOSIS — G8929 Other chronic pain: Secondary | ICD-10-CM | POA: Diagnosis not present

## 2016-09-02 DIAGNOSIS — M549 Dorsalgia, unspecified: Secondary | ICD-10-CM | POA: Diagnosis not present

## 2016-09-02 DIAGNOSIS — F17200 Nicotine dependence, unspecified, uncomplicated: Secondary | ICD-10-CM | POA: Diagnosis not present

## 2016-10-06 DIAGNOSIS — M549 Dorsalgia, unspecified: Secondary | ICD-10-CM | POA: Diagnosis not present

## 2016-10-06 DIAGNOSIS — M545 Low back pain: Secondary | ICD-10-CM | POA: Diagnosis not present

## 2016-10-07 DIAGNOSIS — M79672 Pain in left foot: Secondary | ICD-10-CM | POA: Diagnosis not present

## 2016-10-07 DIAGNOSIS — M549 Dorsalgia, unspecified: Secondary | ICD-10-CM | POA: Diagnosis not present

## 2016-10-07 DIAGNOSIS — M79671 Pain in right foot: Secondary | ICD-10-CM | POA: Diagnosis not present

## 2016-10-07 DIAGNOSIS — G8929 Other chronic pain: Secondary | ICD-10-CM | POA: Diagnosis not present

## 2017-01-27 DIAGNOSIS — G8929 Other chronic pain: Secondary | ICD-10-CM | POA: Diagnosis not present

## 2017-01-27 DIAGNOSIS — M549 Dorsalgia, unspecified: Secondary | ICD-10-CM | POA: Diagnosis not present

## 2017-01-27 DIAGNOSIS — Z125 Encounter for screening for malignant neoplasm of prostate: Secondary | ICD-10-CM | POA: Diagnosis not present

## 2017-01-27 DIAGNOSIS — J449 Chronic obstructive pulmonary disease, unspecified: Secondary | ICD-10-CM | POA: Diagnosis not present

## 2017-01-27 DIAGNOSIS — N529 Male erectile dysfunction, unspecified: Secondary | ICD-10-CM | POA: Diagnosis not present

## 2017-02-15 DIAGNOSIS — R079 Chest pain, unspecified: Secondary | ICD-10-CM | POA: Diagnosis not present

## 2017-02-15 DIAGNOSIS — R634 Abnormal weight loss: Secondary | ICD-10-CM | POA: Diagnosis not present

## 2017-02-15 DIAGNOSIS — Z720 Tobacco use: Secondary | ICD-10-CM | POA: Diagnosis not present

## 2017-02-15 DIAGNOSIS — F17210 Nicotine dependence, cigarettes, uncomplicated: Secondary | ICD-10-CM | POA: Diagnosis not present

## 2017-02-15 DIAGNOSIS — C3411 Malignant neoplasm of upper lobe, right bronchus or lung: Secondary | ICD-10-CM | POA: Diagnosis not present

## 2017-02-15 DIAGNOSIS — C3490 Malignant neoplasm of unspecified part of unspecified bronchus or lung: Secondary | ICD-10-CM | POA: Diagnosis not present

## 2017-02-15 DIAGNOSIS — R0602 Shortness of breath: Secondary | ICD-10-CM | POA: Diagnosis not present

## 2017-03-15 DIAGNOSIS — J449 Chronic obstructive pulmonary disease, unspecified: Secondary | ICD-10-CM | POA: Diagnosis not present

## 2017-03-15 DIAGNOSIS — J302 Other seasonal allergic rhinitis: Secondary | ICD-10-CM | POA: Diagnosis not present

## 2017-03-15 DIAGNOSIS — G8929 Other chronic pain: Secondary | ICD-10-CM | POA: Diagnosis not present

## 2017-03-15 DIAGNOSIS — Z681 Body mass index (BMI) 19 or less, adult: Secondary | ICD-10-CM | POA: Diagnosis not present

## 2017-03-15 DIAGNOSIS — J305 Allergic rhinitis due to food: Secondary | ICD-10-CM | POA: Diagnosis not present

## 2017-03-15 DIAGNOSIS — M549 Dorsalgia, unspecified: Secondary | ICD-10-CM | POA: Diagnosis not present

## 2017-03-16 DIAGNOSIS — J305 Allergic rhinitis due to food: Secondary | ICD-10-CM | POA: Diagnosis not present

## 2017-03-16 DIAGNOSIS — J302 Other seasonal allergic rhinitis: Secondary | ICD-10-CM | POA: Diagnosis not present

## 2017-03-17 DIAGNOSIS — J305 Allergic rhinitis due to food: Secondary | ICD-10-CM | POA: Diagnosis not present

## 2017-03-17 DIAGNOSIS — J302 Other seasonal allergic rhinitis: Secondary | ICD-10-CM | POA: Diagnosis not present

## 2017-03-18 DIAGNOSIS — J302 Other seasonal allergic rhinitis: Secondary | ICD-10-CM | POA: Diagnosis not present

## 2017-03-18 DIAGNOSIS — J305 Allergic rhinitis due to food: Secondary | ICD-10-CM | POA: Diagnosis not present

## 2017-03-21 DIAGNOSIS — J302 Other seasonal allergic rhinitis: Secondary | ICD-10-CM | POA: Diagnosis not present

## 2017-03-21 DIAGNOSIS — J305 Allergic rhinitis due to food: Secondary | ICD-10-CM | POA: Diagnosis not present

## 2017-03-22 DIAGNOSIS — J302 Other seasonal allergic rhinitis: Secondary | ICD-10-CM | POA: Diagnosis not present

## 2017-03-22 DIAGNOSIS — J305 Allergic rhinitis due to food: Secondary | ICD-10-CM | POA: Diagnosis not present

## 2017-03-23 DIAGNOSIS — J302 Other seasonal allergic rhinitis: Secondary | ICD-10-CM | POA: Diagnosis not present

## 2017-03-23 DIAGNOSIS — J305 Allergic rhinitis due to food: Secondary | ICD-10-CM | POA: Diagnosis not present

## 2017-03-23 DIAGNOSIS — K219 Gastro-esophageal reflux disease without esophagitis: Secondary | ICD-10-CM | POA: Diagnosis not present

## 2017-03-23 DIAGNOSIS — Z139 Encounter for screening, unspecified: Secondary | ICD-10-CM | POA: Diagnosis not present

## 2017-03-23 DIAGNOSIS — Z1389 Encounter for screening for other disorder: Secondary | ICD-10-CM | POA: Diagnosis not present

## 2017-03-23 DIAGNOSIS — J449 Chronic obstructive pulmonary disease, unspecified: Secondary | ICD-10-CM | POA: Diagnosis not present

## 2017-03-23 DIAGNOSIS — Z0000 Encounter for general adult medical examination without abnormal findings: Secondary | ICD-10-CM | POA: Diagnosis not present

## 2017-03-23 DIAGNOSIS — G8929 Other chronic pain: Secondary | ICD-10-CM | POA: Diagnosis not present

## 2017-03-23 DIAGNOSIS — M549 Dorsalgia, unspecified: Secondary | ICD-10-CM | POA: Diagnosis not present

## 2017-03-24 DIAGNOSIS — J302 Other seasonal allergic rhinitis: Secondary | ICD-10-CM | POA: Diagnosis not present

## 2017-03-24 DIAGNOSIS — J305 Allergic rhinitis due to food: Secondary | ICD-10-CM | POA: Diagnosis not present

## 2017-03-26 DIAGNOSIS — R000 Tachycardia, unspecified: Secondary | ICD-10-CM | POA: Diagnosis not present

## 2017-03-26 DIAGNOSIS — G629 Polyneuropathy, unspecified: Secondary | ICD-10-CM | POA: Diagnosis not present

## 2017-03-26 DIAGNOSIS — J449 Chronic obstructive pulmonary disease, unspecified: Secondary | ICD-10-CM | POA: Diagnosis not present

## 2017-03-26 DIAGNOSIS — K5010 Crohn's disease of large intestine without complications: Secondary | ICD-10-CM | POA: Diagnosis not present

## 2017-03-29 DIAGNOSIS — M5416 Radiculopathy, lumbar region: Secondary | ICD-10-CM | POA: Diagnosis not present

## 2017-03-29 DIAGNOSIS — M5417 Radiculopathy, lumbosacral region: Secondary | ICD-10-CM | POA: Diagnosis not present

## 2017-03-29 DIAGNOSIS — G629 Polyneuropathy, unspecified: Secondary | ICD-10-CM | POA: Diagnosis not present

## 2017-03-29 DIAGNOSIS — Z85118 Personal history of other malignant neoplasm of bronchus and lung: Secondary | ICD-10-CM | POA: Diagnosis not present

## 2017-05-10 DIAGNOSIS — Z85118 Personal history of other malignant neoplasm of bronchus and lung: Secondary | ICD-10-CM | POA: Diagnosis not present

## 2017-05-10 DIAGNOSIS — E860 Dehydration: Secondary | ICD-10-CM | POA: Diagnosis not present

## 2017-05-10 DIAGNOSIS — R404 Transient alteration of awareness: Secondary | ICD-10-CM | POA: Diagnosis not present

## 2017-05-10 DIAGNOSIS — E876 Hypokalemia: Secondary | ICD-10-CM | POA: Diagnosis not present

## 2017-05-10 DIAGNOSIS — Z79899 Other long term (current) drug therapy: Secondary | ICD-10-CM | POA: Diagnosis not present

## 2017-05-10 DIAGNOSIS — M5417 Radiculopathy, lumbosacral region: Secondary | ICD-10-CM | POA: Diagnosis not present

## 2017-05-10 DIAGNOSIS — G629 Polyneuropathy, unspecified: Secondary | ICD-10-CM | POA: Diagnosis not present

## 2017-05-10 DIAGNOSIS — K219 Gastro-esophageal reflux disease without esophagitis: Secondary | ICD-10-CM | POA: Diagnosis not present

## 2017-05-10 DIAGNOSIS — F17210 Nicotine dependence, cigarettes, uncomplicated: Secondary | ICD-10-CM | POA: Diagnosis not present

## 2017-05-10 DIAGNOSIS — J449 Chronic obstructive pulmonary disease, unspecified: Secondary | ICD-10-CM | POA: Diagnosis not present

## 2017-05-10 DIAGNOSIS — M5416 Radiculopathy, lumbar region: Secondary | ICD-10-CM | POA: Diagnosis not present

## 2017-05-10 DIAGNOSIS — R42 Dizziness and giddiness: Secondary | ICD-10-CM | POA: Diagnosis not present

## 2017-05-26 DIAGNOSIS — Z7189 Other specified counseling: Secondary | ICD-10-CM | POA: Diagnosis not present

## 2017-05-26 DIAGNOSIS — R21 Rash and other nonspecific skin eruption: Secondary | ICD-10-CM | POA: Diagnosis not present

## 2017-05-26 DIAGNOSIS — E876 Hypokalemia: Secondary | ICD-10-CM | POA: Diagnosis not present

## 2017-05-26 DIAGNOSIS — Z681 Body mass index (BMI) 19 or less, adult: Secondary | ICD-10-CM | POA: Diagnosis not present

## 2017-05-31 DIAGNOSIS — F17210 Nicotine dependence, cigarettes, uncomplicated: Secondary | ICD-10-CM | POA: Diagnosis not present

## 2017-05-31 DIAGNOSIS — G8929 Other chronic pain: Secondary | ICD-10-CM | POA: Diagnosis not present

## 2017-05-31 DIAGNOSIS — Z9842 Cataract extraction status, left eye: Secondary | ICD-10-CM | POA: Diagnosis not present

## 2017-05-31 DIAGNOSIS — G4700 Insomnia, unspecified: Secondary | ICD-10-CM | POA: Diagnosis not present

## 2017-05-31 DIAGNOSIS — K219 Gastro-esophageal reflux disease without esophagitis: Secondary | ICD-10-CM | POA: Diagnosis not present

## 2017-05-31 DIAGNOSIS — M549 Dorsalgia, unspecified: Secondary | ICD-10-CM | POA: Diagnosis not present

## 2017-05-31 DIAGNOSIS — F17290 Nicotine dependence, other tobacco product, uncomplicated: Secondary | ICD-10-CM | POA: Diagnosis not present

## 2017-05-31 DIAGNOSIS — G629 Polyneuropathy, unspecified: Secondary | ICD-10-CM | POA: Diagnosis not present

## 2017-05-31 DIAGNOSIS — J449 Chronic obstructive pulmonary disease, unspecified: Secondary | ICD-10-CM | POA: Diagnosis not present

## 2017-05-31 DIAGNOSIS — Z79899 Other long term (current) drug therapy: Secondary | ICD-10-CM | POA: Diagnosis not present

## 2017-05-31 DIAGNOSIS — H1031 Unspecified acute conjunctivitis, right eye: Secondary | ICD-10-CM | POA: Diagnosis not present

## 2017-05-31 DIAGNOSIS — Z7951 Long term (current) use of inhaled steroids: Secondary | ICD-10-CM | POA: Diagnosis not present

## 2017-05-31 DIAGNOSIS — Z85118 Personal history of other malignant neoplasm of bronchus and lung: Secondary | ICD-10-CM | POA: Diagnosis not present

## 2017-05-31 DIAGNOSIS — Z9841 Cataract extraction status, right eye: Secondary | ICD-10-CM | POA: Diagnosis not present

## 2017-06-08 DIAGNOSIS — Z902 Acquired absence of lung [part of]: Secondary | ICD-10-CM | POA: Diagnosis not present

## 2017-06-08 DIAGNOSIS — M5417 Radiculopathy, lumbosacral region: Secondary | ICD-10-CM | POA: Diagnosis not present

## 2017-06-08 DIAGNOSIS — G629 Polyneuropathy, unspecified: Secondary | ICD-10-CM | POA: Diagnosis not present

## 2017-06-08 DIAGNOSIS — M5416 Radiculopathy, lumbar region: Secondary | ICD-10-CM | POA: Diagnosis not present

## 2017-06-14 DIAGNOSIS — Z85118 Personal history of other malignant neoplasm of bronchus and lung: Secondary | ICD-10-CM | POA: Diagnosis not present

## 2017-06-14 DIAGNOSIS — Z1509 Genetic susceptibility to other malignant neoplasm: Secondary | ICD-10-CM | POA: Diagnosis not present

## 2017-06-14 DIAGNOSIS — Z1379 Encounter for other screening for genetic and chromosomal anomalies: Secondary | ICD-10-CM | POA: Diagnosis not present

## 2017-06-14 DIAGNOSIS — Z681 Body mass index (BMI) 19 or less, adult: Secondary | ICD-10-CM | POA: Diagnosis not present

## 2017-06-14 DIAGNOSIS — Z23 Encounter for immunization: Secondary | ICD-10-CM | POA: Diagnosis not present

## 2017-06-14 DIAGNOSIS — Z86010 Personal history of colonic polyps: Secondary | ICD-10-CM | POA: Diagnosis not present

## 2017-06-14 DIAGNOSIS — E876 Hypokalemia: Secondary | ICD-10-CM | POA: Diagnosis not present

## 2017-06-14 DIAGNOSIS — N183 Chronic kidney disease, stage 3 (moderate): Secondary | ICD-10-CM | POA: Diagnosis not present

## 2017-07-12 DIAGNOSIS — Z6820 Body mass index (BMI) 20.0-20.9, adult: Secondary | ICD-10-CM | POA: Diagnosis not present

## 2017-07-12 DIAGNOSIS — L03116 Cellulitis of left lower limb: Secondary | ICD-10-CM | POA: Diagnosis not present

## 2017-08-01 DIAGNOSIS — Z79891 Long term (current) use of opiate analgesic: Secondary | ICD-10-CM | POA: Diagnosis not present

## 2017-08-01 DIAGNOSIS — Z5181 Encounter for therapeutic drug level monitoring: Secondary | ICD-10-CM | POA: Diagnosis not present

## 2017-08-01 DIAGNOSIS — G894 Chronic pain syndrome: Secondary | ICD-10-CM | POA: Diagnosis not present

## 2017-08-01 DIAGNOSIS — M47812 Spondylosis without myelopathy or radiculopathy, cervical region: Secondary | ICD-10-CM | POA: Diagnosis not present

## 2017-08-01 DIAGNOSIS — M5416 Radiculopathy, lumbar region: Secondary | ICD-10-CM | POA: Diagnosis not present

## 2017-08-02 DIAGNOSIS — G4700 Insomnia, unspecified: Secondary | ICD-10-CM | POA: Diagnosis not present

## 2017-08-02 DIAGNOSIS — C3490 Malignant neoplasm of unspecified part of unspecified bronchus or lung: Secondary | ICD-10-CM | POA: Diagnosis not present

## 2017-08-02 DIAGNOSIS — J449 Chronic obstructive pulmonary disease, unspecified: Secondary | ICD-10-CM | POA: Diagnosis not present

## 2017-08-03 DIAGNOSIS — M4726 Other spondylosis with radiculopathy, lumbar region: Secondary | ICD-10-CM | POA: Diagnosis not present

## 2017-08-03 DIAGNOSIS — M47812 Spondylosis without myelopathy or radiculopathy, cervical region: Secondary | ICD-10-CM | POA: Diagnosis not present

## 2017-08-03 DIAGNOSIS — M5116 Intervertebral disc disorders with radiculopathy, lumbar region: Secondary | ICD-10-CM | POA: Diagnosis not present

## 2017-08-03 DIAGNOSIS — Z85118 Personal history of other malignant neoplasm of bronchus and lung: Secondary | ICD-10-CM | POA: Diagnosis not present

## 2017-08-23 DIAGNOSIS — J3089 Other allergic rhinitis: Secondary | ICD-10-CM | POA: Diagnosis not present

## 2017-09-06 DIAGNOSIS — J3089 Other allergic rhinitis: Secondary | ICD-10-CM | POA: Diagnosis not present

## 2017-09-13 DIAGNOSIS — Z6821 Body mass index (BMI) 21.0-21.9, adult: Secondary | ICD-10-CM | POA: Diagnosis not present

## 2017-09-13 DIAGNOSIS — J3089 Other allergic rhinitis: Secondary | ICD-10-CM | POA: Diagnosis not present

## 2017-09-13 DIAGNOSIS — W19XXXA Unspecified fall, initial encounter: Secondary | ICD-10-CM | POA: Diagnosis not present

## 2017-09-13 DIAGNOSIS — Z8673 Personal history of transient ischemic attack (TIA), and cerebral infarction without residual deficits: Secondary | ICD-10-CM | POA: Diagnosis not present

## 2017-09-21 DIAGNOSIS — R51 Headache: Secondary | ICD-10-CM | POA: Diagnosis not present

## 2017-09-21 DIAGNOSIS — R55 Syncope and collapse: Secondary | ICD-10-CM | POA: Diagnosis not present

## 2017-09-21 DIAGNOSIS — Z8673 Personal history of transient ischemic attack (TIA), and cerebral infarction without residual deficits: Secondary | ICD-10-CM | POA: Diagnosis not present

## 2017-09-21 DIAGNOSIS — Z9181 History of falling: Secondary | ICD-10-CM | POA: Diagnosis not present

## 2017-09-22 DIAGNOSIS — R262 Difficulty in walking, not elsewhere classified: Secondary | ICD-10-CM | POA: Diagnosis not present

## 2017-09-22 DIAGNOSIS — J3089 Other allergic rhinitis: Secondary | ICD-10-CM | POA: Diagnosis not present

## 2017-09-22 DIAGNOSIS — W010XXD Fall on same level from slipping, tripping and stumbling without subsequent striking against object, subsequent encounter: Secondary | ICD-10-CM | POA: Diagnosis not present

## 2017-09-22 DIAGNOSIS — M6281 Muscle weakness (generalized): Secondary | ICD-10-CM | POA: Diagnosis not present

## 2017-10-04 DIAGNOSIS — R930 Abnormal findings on diagnostic imaging of skull and head, not elsewhere classified: Secondary | ICD-10-CM | POA: Diagnosis not present

## 2017-10-04 DIAGNOSIS — Z6820 Body mass index (BMI) 20.0-20.9, adult: Secondary | ICD-10-CM | POA: Diagnosis not present

## 2017-10-04 DIAGNOSIS — J3089 Other allergic rhinitis: Secondary | ICD-10-CM | POA: Diagnosis not present

## 2017-10-04 DIAGNOSIS — W19XXXA Unspecified fall, initial encounter: Secondary | ICD-10-CM | POA: Diagnosis not present

## 2017-10-14 DIAGNOSIS — J3089 Other allergic rhinitis: Secondary | ICD-10-CM | POA: Diagnosis not present

## 2017-10-21 DIAGNOSIS — F17210 Nicotine dependence, cigarettes, uncomplicated: Secondary | ICD-10-CM | POA: Diagnosis not present

## 2017-10-21 DIAGNOSIS — K8020 Calculus of gallbladder without cholecystitis without obstruction: Secondary | ICD-10-CM | POA: Diagnosis not present

## 2017-10-21 DIAGNOSIS — I10 Essential (primary) hypertension: Secondary | ICD-10-CM | POA: Diagnosis not present

## 2017-10-21 DIAGNOSIS — K529 Noninfective gastroenteritis and colitis, unspecified: Secondary | ICD-10-CM | POA: Diagnosis not present

## 2017-10-28 DIAGNOSIS — J3089 Other allergic rhinitis: Secondary | ICD-10-CM | POA: Diagnosis not present

## 2017-11-01 DIAGNOSIS — Z8673 Personal history of transient ischemic attack (TIA), and cerebral infarction without residual deficits: Secondary | ICD-10-CM | POA: Diagnosis not present

## 2017-11-01 DIAGNOSIS — R930 Abnormal findings on diagnostic imaging of skull and head, not elsewhere classified: Secondary | ICD-10-CM | POA: Diagnosis not present

## 2017-11-01 DIAGNOSIS — R42 Dizziness and giddiness: Secondary | ICD-10-CM | POA: Diagnosis not present

## 2017-11-01 DIAGNOSIS — R296 Repeated falls: Secondary | ICD-10-CM | POA: Diagnosis not present

## 2017-11-08 DIAGNOSIS — R296 Repeated falls: Secondary | ICD-10-CM | POA: Diagnosis not present

## 2017-11-08 DIAGNOSIS — J3089 Other allergic rhinitis: Secondary | ICD-10-CM | POA: Diagnosis not present

## 2017-11-08 DIAGNOSIS — Z789 Other specified health status: Secondary | ICD-10-CM | POA: Diagnosis not present

## 2017-11-08 DIAGNOSIS — M722 Plantar fascial fibromatosis: Secondary | ICD-10-CM | POA: Diagnosis not present

## 2017-11-08 DIAGNOSIS — Z681 Body mass index (BMI) 19 or less, adult: Secondary | ICD-10-CM | POA: Diagnosis not present

## 2017-11-17 DIAGNOSIS — J3089 Other allergic rhinitis: Secondary | ICD-10-CM | POA: Diagnosis not present

## 2017-11-22 DIAGNOSIS — J3089 Other allergic rhinitis: Secondary | ICD-10-CM | POA: Diagnosis not present

## 2017-11-29 DIAGNOSIS — J3089 Other allergic rhinitis: Secondary | ICD-10-CM | POA: Diagnosis not present

## 2017-11-30 DIAGNOSIS — Z9221 Personal history of antineoplastic chemotherapy: Secondary | ICD-10-CM | POA: Diagnosis not present

## 2017-11-30 DIAGNOSIS — C3411 Malignant neoplasm of upper lobe, right bronchus or lung: Secondary | ICD-10-CM | POA: Diagnosis not present

## 2017-11-30 DIAGNOSIS — Z716 Tobacco abuse counseling: Secondary | ICD-10-CM | POA: Diagnosis not present

## 2017-11-30 DIAGNOSIS — Z923 Personal history of irradiation: Secondary | ICD-10-CM | POA: Diagnosis not present

## 2017-11-30 DIAGNOSIS — Z85118 Personal history of other malignant neoplasm of bronchus and lung: Secondary | ICD-10-CM | POA: Diagnosis not present

## 2017-11-30 DIAGNOSIS — C3490 Malignant neoplasm of unspecified part of unspecified bronchus or lung: Secondary | ICD-10-CM | POA: Diagnosis not present

## 2017-11-30 DIAGNOSIS — F17218 Nicotine dependence, cigarettes, with other nicotine-induced disorders: Secondary | ICD-10-CM | POA: Diagnosis not present

## 2017-12-01 DIAGNOSIS — Z85118 Personal history of other malignant neoplasm of bronchus and lung: Secondary | ICD-10-CM | POA: Diagnosis not present

## 2017-12-06 DIAGNOSIS — Z681 Body mass index (BMI) 19 or less, adult: Secondary | ICD-10-CM | POA: Diagnosis not present

## 2017-12-06 DIAGNOSIS — R296 Repeated falls: Secondary | ICD-10-CM | POA: Diagnosis not present

## 2017-12-06 DIAGNOSIS — J3089 Other allergic rhinitis: Secondary | ICD-10-CM | POA: Diagnosis not present

## 2017-12-13 DIAGNOSIS — Z452 Encounter for adjustment and management of vascular access device: Secondary | ICD-10-CM | POA: Diagnosis not present

## 2017-12-13 DIAGNOSIS — Z85118 Personal history of other malignant neoplasm of bronchus and lung: Secondary | ICD-10-CM | POA: Diagnosis not present

## 2017-12-13 DIAGNOSIS — C3411 Malignant neoplasm of upper lobe, right bronchus or lung: Secondary | ICD-10-CM | POA: Diagnosis not present

## 2017-12-14 DIAGNOSIS — Z789 Other specified health status: Secondary | ICD-10-CM | POA: Diagnosis not present

## 2017-12-14 DIAGNOSIS — J3089 Other allergic rhinitis: Secondary | ICD-10-CM | POA: Diagnosis not present

## 2017-12-20 DIAGNOSIS — J3089 Other allergic rhinitis: Secondary | ICD-10-CM | POA: Diagnosis not present

## 2018-01-03 DIAGNOSIS — J3089 Other allergic rhinitis: Secondary | ICD-10-CM | POA: Diagnosis not present

## 2018-01-25 DIAGNOSIS — J3089 Other allergic rhinitis: Secondary | ICD-10-CM | POA: Diagnosis not present

## 2018-01-31 DIAGNOSIS — F17200 Nicotine dependence, unspecified, uncomplicated: Secondary | ICD-10-CM | POA: Diagnosis not present

## 2018-01-31 DIAGNOSIS — Z7189 Other specified counseling: Secondary | ICD-10-CM | POA: Diagnosis not present

## 2018-01-31 DIAGNOSIS — Z1331 Encounter for screening for depression: Secondary | ICD-10-CM | POA: Diagnosis not present

## 2018-01-31 DIAGNOSIS — J3089 Other allergic rhinitis: Secondary | ICD-10-CM | POA: Diagnosis not present

## 2018-01-31 DIAGNOSIS — R569 Unspecified convulsions: Secondary | ICD-10-CM | POA: Diagnosis not present

## 2018-02-07 DIAGNOSIS — J3089 Other allergic rhinitis: Secondary | ICD-10-CM | POA: Diagnosis not present

## 2018-02-14 DIAGNOSIS — J3089 Other allergic rhinitis: Secondary | ICD-10-CM | POA: Diagnosis not present

## 2018-02-21 DIAGNOSIS — Z13220 Encounter for screening for lipoid disorders: Secondary | ICD-10-CM | POA: Diagnosis not present

## 2018-02-21 DIAGNOSIS — J3089 Other allergic rhinitis: Secondary | ICD-10-CM | POA: Diagnosis not present

## 2018-02-21 DIAGNOSIS — N529 Male erectile dysfunction, unspecified: Secondary | ICD-10-CM | POA: Diagnosis not present

## 2018-02-23 DIAGNOSIS — H278 Other specified disorders of lens: Secondary | ICD-10-CM | POA: Diagnosis not present

## 2018-02-24 DIAGNOSIS — J449 Chronic obstructive pulmonary disease, unspecified: Secondary | ICD-10-CM | POA: Diagnosis not present

## 2018-02-24 DIAGNOSIS — G629 Polyneuropathy, unspecified: Secondary | ICD-10-CM | POA: Diagnosis not present

## 2018-02-28 DIAGNOSIS — R569 Unspecified convulsions: Secondary | ICD-10-CM | POA: Diagnosis not present

## 2018-02-28 DIAGNOSIS — F17200 Nicotine dependence, unspecified, uncomplicated: Secondary | ICD-10-CM | POA: Diagnosis not present

## 2018-02-28 DIAGNOSIS — K5010 Crohn's disease of large intestine without complications: Secondary | ICD-10-CM | POA: Diagnosis not present

## 2018-02-28 DIAGNOSIS — G629 Polyneuropathy, unspecified: Secondary | ICD-10-CM | POA: Diagnosis not present

## 2018-02-28 DIAGNOSIS — J449 Chronic obstructive pulmonary disease, unspecified: Secondary | ICD-10-CM | POA: Diagnosis not present

## 2018-03-07 DIAGNOSIS — J3089 Other allergic rhinitis: Secondary | ICD-10-CM | POA: Diagnosis not present

## 2018-03-14 DIAGNOSIS — J3089 Other allergic rhinitis: Secondary | ICD-10-CM | POA: Diagnosis not present

## 2018-03-21 DIAGNOSIS — G629 Polyneuropathy, unspecified: Secondary | ICD-10-CM | POA: Diagnosis not present

## 2018-03-21 DIAGNOSIS — Z79891 Long term (current) use of opiate analgesic: Secondary | ICD-10-CM | POA: Diagnosis not present

## 2018-03-21 DIAGNOSIS — J3089 Other allergic rhinitis: Secondary | ICD-10-CM | POA: Diagnosis not present

## 2018-03-21 DIAGNOSIS — M545 Low back pain: Secondary | ICD-10-CM | POA: Diagnosis not present

## 2018-03-21 DIAGNOSIS — Z902 Acquired absence of lung [part of]: Secondary | ICD-10-CM | POA: Diagnosis not present

## 2018-03-21 DIAGNOSIS — R531 Weakness: Secondary | ICD-10-CM | POA: Diagnosis not present

## 2018-03-21 DIAGNOSIS — R269 Unspecified abnormalities of gait and mobility: Secondary | ICD-10-CM | POA: Diagnosis not present

## 2018-03-23 DIAGNOSIS — M79671 Pain in right foot: Secondary | ICD-10-CM | POA: Diagnosis not present

## 2018-03-23 DIAGNOSIS — R531 Weakness: Secondary | ICD-10-CM | POA: Diagnosis not present

## 2018-03-23 DIAGNOSIS — R269 Unspecified abnormalities of gait and mobility: Secondary | ICD-10-CM | POA: Diagnosis not present

## 2018-03-23 DIAGNOSIS — Z8673 Personal history of transient ischemic attack (TIA), and cerebral infarction without residual deficits: Secondary | ICD-10-CM | POA: Diagnosis not present

## 2018-03-28 DIAGNOSIS — Z6820 Body mass index (BMI) 20.0-20.9, adult: Secondary | ICD-10-CM | POA: Diagnosis not present

## 2018-03-28 DIAGNOSIS — H6120 Impacted cerumen, unspecified ear: Secondary | ICD-10-CM | POA: Diagnosis not present

## 2018-03-28 DIAGNOSIS — J3089 Other allergic rhinitis: Secondary | ICD-10-CM | POA: Diagnosis not present

## 2018-04-04 DIAGNOSIS — Z9181 History of falling: Secondary | ICD-10-CM | POA: Diagnosis not present

## 2018-04-04 DIAGNOSIS — R591 Generalized enlarged lymph nodes: Secondary | ICD-10-CM | POA: Diagnosis not present

## 2018-04-04 DIAGNOSIS — Z139 Encounter for screening, unspecified: Secondary | ICD-10-CM | POA: Diagnosis not present

## 2018-04-04 DIAGNOSIS — J3089 Other allergic rhinitis: Secondary | ICD-10-CM | POA: Diagnosis not present

## 2018-04-04 DIAGNOSIS — Z0000 Encounter for general adult medical examination without abnormal findings: Secondary | ICD-10-CM | POA: Diagnosis not present

## 2018-04-04 DIAGNOSIS — Z1331 Encounter for screening for depression: Secondary | ICD-10-CM | POA: Diagnosis not present

## 2018-04-04 DIAGNOSIS — Z6820 Body mass index (BMI) 20.0-20.9, adult: Secondary | ICD-10-CM | POA: Diagnosis not present

## 2018-04-07 DIAGNOSIS — R591 Generalized enlarged lymph nodes: Secondary | ICD-10-CM | POA: Diagnosis not present

## 2018-04-07 DIAGNOSIS — H26492 Other secondary cataract, left eye: Secondary | ICD-10-CM | POA: Diagnosis not present

## 2018-04-07 DIAGNOSIS — J439 Emphysema, unspecified: Secondary | ICD-10-CM | POA: Diagnosis not present

## 2018-04-11 DIAGNOSIS — R591 Generalized enlarged lymph nodes: Secondary | ICD-10-CM | POA: Diagnosis not present

## 2018-04-11 DIAGNOSIS — Z6821 Body mass index (BMI) 21.0-21.9, adult: Secondary | ICD-10-CM | POA: Diagnosis not present

## 2018-04-18 DIAGNOSIS — H26491 Other secondary cataract, right eye: Secondary | ICD-10-CM | POA: Diagnosis not present

## 2018-04-26 DIAGNOSIS — K5010 Crohn's disease of large intestine without complications: Secondary | ICD-10-CM | POA: Diagnosis not present

## 2018-04-26 DIAGNOSIS — G629 Polyneuropathy, unspecified: Secondary | ICD-10-CM | POA: Diagnosis not present

## 2018-04-26 DIAGNOSIS — J449 Chronic obstructive pulmonary disease, unspecified: Secondary | ICD-10-CM | POA: Diagnosis not present

## 2018-04-26 DIAGNOSIS — R569 Unspecified convulsions: Secondary | ICD-10-CM | POA: Diagnosis not present

## 2018-05-02 DIAGNOSIS — R42 Dizziness and giddiness: Secondary | ICD-10-CM | POA: Diagnosis not present

## 2018-05-02 DIAGNOSIS — R569 Unspecified convulsions: Secondary | ICD-10-CM | POA: Diagnosis not present

## 2018-05-02 DIAGNOSIS — Z6820 Body mass index (BMI) 20.0-20.9, adult: Secondary | ICD-10-CM | POA: Diagnosis not present

## 2018-05-26 DIAGNOSIS — R569 Unspecified convulsions: Secondary | ICD-10-CM | POA: Diagnosis not present

## 2018-05-26 DIAGNOSIS — G629 Polyneuropathy, unspecified: Secondary | ICD-10-CM | POA: Diagnosis not present

## 2018-05-26 DIAGNOSIS — K5010 Crohn's disease of large intestine without complications: Secondary | ICD-10-CM | POA: Diagnosis not present

## 2018-05-26 DIAGNOSIS — J449 Chronic obstructive pulmonary disease, unspecified: Secondary | ICD-10-CM | POA: Diagnosis not present

## 2018-06-06 DIAGNOSIS — G629 Polyneuropathy, unspecified: Secondary | ICD-10-CM | POA: Diagnosis not present

## 2018-06-06 DIAGNOSIS — F17200 Nicotine dependence, unspecified, uncomplicated: Secondary | ICD-10-CM | POA: Diagnosis not present

## 2018-06-06 DIAGNOSIS — J3089 Other allergic rhinitis: Secondary | ICD-10-CM | POA: Diagnosis not present

## 2018-06-06 DIAGNOSIS — Z23 Encounter for immunization: Secondary | ICD-10-CM | POA: Diagnosis not present

## 2018-06-06 DIAGNOSIS — E860 Dehydration: Secondary | ICD-10-CM | POA: Diagnosis not present

## 2018-06-26 DIAGNOSIS — K5010 Crohn's disease of large intestine without complications: Secondary | ICD-10-CM | POA: Diagnosis not present

## 2018-06-26 DIAGNOSIS — J449 Chronic obstructive pulmonary disease, unspecified: Secondary | ICD-10-CM | POA: Diagnosis not present

## 2018-06-26 DIAGNOSIS — R569 Unspecified convulsions: Secondary | ICD-10-CM | POA: Diagnosis not present

## 2018-06-26 DIAGNOSIS — G629 Polyneuropathy, unspecified: Secondary | ICD-10-CM | POA: Diagnosis not present

## 2018-07-04 DIAGNOSIS — J3089 Other allergic rhinitis: Secondary | ICD-10-CM | POA: Diagnosis not present

## 2018-07-06 DIAGNOSIS — Z6820 Body mass index (BMI) 20.0-20.9, adult: Secondary | ICD-10-CM | POA: Diagnosis not present

## 2018-07-06 DIAGNOSIS — M069 Rheumatoid arthritis, unspecified: Secondary | ICD-10-CM | POA: Diagnosis not present

## 2018-07-06 DIAGNOSIS — Z23 Encounter for immunization: Secondary | ICD-10-CM | POA: Diagnosis not present

## 2018-07-27 DIAGNOSIS — E1129 Type 2 diabetes mellitus with other diabetic kidney complication: Secondary | ICD-10-CM | POA: Diagnosis not present

## 2018-07-27 DIAGNOSIS — I10 Essential (primary) hypertension: Secondary | ICD-10-CM | POA: Diagnosis not present

## 2018-07-27 DIAGNOSIS — E1151 Type 2 diabetes mellitus with diabetic peripheral angiopathy without gangrene: Secondary | ICD-10-CM | POA: Diagnosis not present

## 2018-07-27 DIAGNOSIS — E782 Mixed hyperlipidemia: Secondary | ICD-10-CM | POA: Diagnosis not present

## 2018-08-01 DIAGNOSIS — J3089 Other allergic rhinitis: Secondary | ICD-10-CM | POA: Diagnosis not present

## 2018-08-26 DIAGNOSIS — E1129 Type 2 diabetes mellitus with other diabetic kidney complication: Secondary | ICD-10-CM | POA: Diagnosis not present

## 2018-08-26 DIAGNOSIS — E782 Mixed hyperlipidemia: Secondary | ICD-10-CM | POA: Diagnosis not present

## 2018-08-26 DIAGNOSIS — E1151 Type 2 diabetes mellitus with diabetic peripheral angiopathy without gangrene: Secondary | ICD-10-CM | POA: Diagnosis not present

## 2018-08-26 DIAGNOSIS — I10 Essential (primary) hypertension: Secondary | ICD-10-CM | POA: Diagnosis not present

## 2018-09-27 DIAGNOSIS — C3490 Malignant neoplasm of unspecified part of unspecified bronchus or lung: Secondary | ICD-10-CM | POA: Diagnosis not present

## 2018-09-27 DIAGNOSIS — I10 Essential (primary) hypertension: Secondary | ICD-10-CM | POA: Diagnosis not present

## 2018-09-27 DIAGNOSIS — K633 Ulcer of intestine: Secondary | ICD-10-CM | POA: Diagnosis not present

## 2018-09-27 DIAGNOSIS — J449 Chronic obstructive pulmonary disease, unspecified: Secondary | ICD-10-CM | POA: Diagnosis not present

## 2018-09-27 HISTORY — PX: A-V CARDIAC PACEMAKER INSERTION: SHX562

## 2018-09-28 DIAGNOSIS — J449 Chronic obstructive pulmonary disease, unspecified: Secondary | ICD-10-CM | POA: Diagnosis not present

## 2018-09-28 DIAGNOSIS — R042 Hemoptysis: Secondary | ICD-10-CM | POA: Diagnosis not present

## 2018-09-28 DIAGNOSIS — Z85118 Personal history of other malignant neoplasm of bronchus and lung: Secondary | ICD-10-CM | POA: Diagnosis not present

## 2018-10-19 DIAGNOSIS — Z6823 Body mass index (BMI) 23.0-23.9, adult: Secondary | ICD-10-CM | POA: Diagnosis not present

## 2018-10-27 DIAGNOSIS — E1129 Type 2 diabetes mellitus with other diabetic kidney complication: Secondary | ICD-10-CM | POA: Diagnosis not present

## 2018-10-27 DIAGNOSIS — Z85118 Personal history of other malignant neoplasm of bronchus and lung: Secondary | ICD-10-CM | POA: Diagnosis not present

## 2018-10-27 DIAGNOSIS — E1151 Type 2 diabetes mellitus with diabetic peripheral angiopathy without gangrene: Secondary | ICD-10-CM | POA: Diagnosis not present

## 2018-10-27 DIAGNOSIS — J449 Chronic obstructive pulmonary disease, unspecified: Secondary | ICD-10-CM | POA: Diagnosis not present

## 2018-10-28 DIAGNOSIS — J449 Chronic obstructive pulmonary disease, unspecified: Secondary | ICD-10-CM | POA: Diagnosis not present

## 2018-10-28 DIAGNOSIS — C3490 Malignant neoplasm of unspecified part of unspecified bronchus or lung: Secondary | ICD-10-CM | POA: Diagnosis not present

## 2018-10-28 DIAGNOSIS — K633 Ulcer of intestine: Secondary | ICD-10-CM | POA: Diagnosis not present

## 2018-10-28 DIAGNOSIS — I10 Essential (primary) hypertension: Secondary | ICD-10-CM | POA: Diagnosis not present

## 2018-11-24 DIAGNOSIS — E1151 Type 2 diabetes mellitus with diabetic peripheral angiopathy without gangrene: Secondary | ICD-10-CM | POA: Diagnosis not present

## 2018-11-24 DIAGNOSIS — J449 Chronic obstructive pulmonary disease, unspecified: Secondary | ICD-10-CM | POA: Diagnosis not present

## 2018-11-24 DIAGNOSIS — I10 Essential (primary) hypertension: Secondary | ICD-10-CM | POA: Diagnosis not present

## 2018-11-24 DIAGNOSIS — E1129 Type 2 diabetes mellitus with other diabetic kidney complication: Secondary | ICD-10-CM | POA: Diagnosis not present

## 2018-11-26 DIAGNOSIS — I10 Essential (primary) hypertension: Secondary | ICD-10-CM | POA: Diagnosis not present

## 2018-11-26 DIAGNOSIS — C3490 Malignant neoplasm of unspecified part of unspecified bronchus or lung: Secondary | ICD-10-CM | POA: Diagnosis not present

## 2018-11-26 DIAGNOSIS — K633 Ulcer of intestine: Secondary | ICD-10-CM | POA: Diagnosis not present

## 2018-11-26 DIAGNOSIS — J449 Chronic obstructive pulmonary disease, unspecified: Secondary | ICD-10-CM | POA: Diagnosis not present

## 2018-12-26 DIAGNOSIS — E1151 Type 2 diabetes mellitus with diabetic peripheral angiopathy without gangrene: Secondary | ICD-10-CM | POA: Diagnosis not present

## 2018-12-26 DIAGNOSIS — E1129 Type 2 diabetes mellitus with other diabetic kidney complication: Secondary | ICD-10-CM | POA: Diagnosis not present

## 2018-12-26 DIAGNOSIS — J449 Chronic obstructive pulmonary disease, unspecified: Secondary | ICD-10-CM | POA: Diagnosis not present

## 2018-12-26 DIAGNOSIS — I10 Essential (primary) hypertension: Secondary | ICD-10-CM | POA: Diagnosis not present

## 2018-12-27 DIAGNOSIS — J449 Chronic obstructive pulmonary disease, unspecified: Secondary | ICD-10-CM | POA: Diagnosis not present

## 2018-12-27 DIAGNOSIS — C3490 Malignant neoplasm of unspecified part of unspecified bronchus or lung: Secondary | ICD-10-CM | POA: Diagnosis not present

## 2019-01-15 DIAGNOSIS — Z0000 Encounter for general adult medical examination without abnormal findings: Secondary | ICD-10-CM | POA: Diagnosis not present

## 2019-01-25 DIAGNOSIS — E1129 Type 2 diabetes mellitus with other diabetic kidney complication: Secondary | ICD-10-CM | POA: Diagnosis not present

## 2019-01-25 DIAGNOSIS — E1151 Type 2 diabetes mellitus with diabetic peripheral angiopathy without gangrene: Secondary | ICD-10-CM | POA: Diagnosis not present

## 2019-01-25 DIAGNOSIS — J449 Chronic obstructive pulmonary disease, unspecified: Secondary | ICD-10-CM | POA: Diagnosis not present

## 2019-01-25 DIAGNOSIS — I10 Essential (primary) hypertension: Secondary | ICD-10-CM | POA: Diagnosis not present

## 2019-01-26 DIAGNOSIS — J449 Chronic obstructive pulmonary disease, unspecified: Secondary | ICD-10-CM | POA: Diagnosis not present

## 2019-01-26 DIAGNOSIS — C3490 Malignant neoplasm of unspecified part of unspecified bronchus or lung: Secondary | ICD-10-CM | POA: Diagnosis not present

## 2019-01-31 DIAGNOSIS — M069 Rheumatoid arthritis, unspecified: Secondary | ICD-10-CM | POA: Diagnosis not present

## 2019-01-31 DIAGNOSIS — J449 Chronic obstructive pulmonary disease, unspecified: Secondary | ICD-10-CM | POA: Diagnosis not present

## 2019-01-31 DIAGNOSIS — N183 Chronic kidney disease, stage 3 (moderate): Secondary | ICD-10-CM | POA: Diagnosis not present

## 2019-02-20 DIAGNOSIS — T300 Burn of unspecified body region, unspecified degree: Secondary | ICD-10-CM | POA: Diagnosis not present

## 2019-02-23 DIAGNOSIS — M069 Rheumatoid arthritis, unspecified: Secondary | ICD-10-CM | POA: Diagnosis not present

## 2019-02-23 DIAGNOSIS — N183 Chronic kidney disease, stage 3 (moderate): Secondary | ICD-10-CM | POA: Diagnosis not present

## 2019-02-23 DIAGNOSIS — J449 Chronic obstructive pulmonary disease, unspecified: Secondary | ICD-10-CM | POA: Diagnosis not present

## 2019-02-26 DIAGNOSIS — I10 Essential (primary) hypertension: Secondary | ICD-10-CM | POA: Diagnosis not present

## 2019-02-26 DIAGNOSIS — J449 Chronic obstructive pulmonary disease, unspecified: Secondary | ICD-10-CM | POA: Diagnosis not present

## 2019-02-26 DIAGNOSIS — K633 Ulcer of intestine: Secondary | ICD-10-CM | POA: Diagnosis not present

## 2019-02-26 DIAGNOSIS — C3490 Malignant neoplasm of unspecified part of unspecified bronchus or lung: Secondary | ICD-10-CM | POA: Diagnosis not present

## 2019-03-27 DIAGNOSIS — N183 Chronic kidney disease, stage 3 (moderate): Secondary | ICD-10-CM | POA: Diagnosis not present

## 2019-03-27 DIAGNOSIS — E1151 Type 2 diabetes mellitus with diabetic peripheral angiopathy without gangrene: Secondary | ICD-10-CM | POA: Diagnosis not present

## 2019-03-27 DIAGNOSIS — J449 Chronic obstructive pulmonary disease, unspecified: Secondary | ICD-10-CM | POA: Diagnosis not present

## 2019-03-27 DIAGNOSIS — M069 Rheumatoid arthritis, unspecified: Secondary | ICD-10-CM | POA: Diagnosis not present

## 2019-03-28 DIAGNOSIS — C3490 Malignant neoplasm of unspecified part of unspecified bronchus or lung: Secondary | ICD-10-CM | POA: Diagnosis not present

## 2019-03-28 DIAGNOSIS — J449 Chronic obstructive pulmonary disease, unspecified: Secondary | ICD-10-CM | POA: Diagnosis not present

## 2019-04-27 DIAGNOSIS — E1151 Type 2 diabetes mellitus with diabetic peripheral angiopathy without gangrene: Secondary | ICD-10-CM | POA: Diagnosis not present

## 2019-04-27 DIAGNOSIS — E1129 Type 2 diabetes mellitus with other diabetic kidney complication: Secondary | ICD-10-CM | POA: Diagnosis not present

## 2019-04-27 DIAGNOSIS — N183 Chronic kidney disease, stage 3 (moderate): Secondary | ICD-10-CM | POA: Diagnosis not present

## 2019-04-27 DIAGNOSIS — J449 Chronic obstructive pulmonary disease, unspecified: Secondary | ICD-10-CM | POA: Diagnosis not present

## 2019-04-28 DIAGNOSIS — C3490 Malignant neoplasm of unspecified part of unspecified bronchus or lung: Secondary | ICD-10-CM | POA: Diagnosis not present

## 2019-04-28 DIAGNOSIS — J449 Chronic obstructive pulmonary disease, unspecified: Secondary | ICD-10-CM | POA: Diagnosis not present

## 2019-05-02 DIAGNOSIS — Z13220 Encounter for screening for lipoid disorders: Secondary | ICD-10-CM | POA: Diagnosis not present

## 2019-05-02 DIAGNOSIS — Z0000 Encounter for general adult medical examination without abnormal findings: Secondary | ICD-10-CM | POA: Diagnosis not present

## 2019-05-02 DIAGNOSIS — Z139 Encounter for screening, unspecified: Secondary | ICD-10-CM | POA: Diagnosis not present

## 2019-05-02 DIAGNOSIS — D649 Anemia, unspecified: Secondary | ICD-10-CM | POA: Diagnosis not present

## 2019-05-02 DIAGNOSIS — N183 Chronic kidney disease, stage 3 (moderate): Secondary | ICD-10-CM | POA: Diagnosis not present

## 2019-05-03 DIAGNOSIS — D649 Anemia, unspecified: Secondary | ICD-10-CM | POA: Diagnosis not present

## 2019-05-03 DIAGNOSIS — Z13220 Encounter for screening for lipoid disorders: Secondary | ICD-10-CM | POA: Diagnosis not present

## 2019-05-03 DIAGNOSIS — N183 Chronic kidney disease, stage 3 (moderate): Secondary | ICD-10-CM | POA: Diagnosis not present

## 2019-05-05 ENCOUNTER — Inpatient Hospital Stay (HOSPITAL_COMMUNITY): Payer: Medicare Other

## 2019-05-05 ENCOUNTER — Other Ambulatory Visit: Payer: Self-pay

## 2019-05-05 ENCOUNTER — Emergency Department (HOSPITAL_COMMUNITY): Admit: 2019-05-05 | Payer: Medicare Other | Admitting: Cardiology

## 2019-05-05 ENCOUNTER — Inpatient Hospital Stay (HOSPITAL_COMMUNITY)
Admission: EM | Disposition: A | Discharge status: Home / Self Care | Payer: Self-pay | Source: Home / Self Care | Attending: Cardiology

## 2019-05-05 ENCOUNTER — Encounter (HOSPITAL_COMMUNITY): Payer: Self-pay

## 2019-05-05 ENCOUNTER — Inpatient Hospital Stay (HOSPITAL_COMMUNITY)
Admission: EM | Admit: 2019-05-05 | Discharge: 2019-05-06 | DRG: 246 | Disposition: A | Discharge status: Home / Self Care | Payer: Medicare Other | Attending: Cardiology | Admitting: Cardiology

## 2019-05-05 DIAGNOSIS — F329 Major depressive disorder, single episode, unspecified: Secondary | ICD-10-CM | POA: Diagnosis present

## 2019-05-05 DIAGNOSIS — G4730 Sleep apnea, unspecified: Secondary | ICD-10-CM | POA: Diagnosis not present

## 2019-05-05 DIAGNOSIS — F17210 Nicotine dependence, cigarettes, uncomplicated: Secondary | ICD-10-CM | POA: Diagnosis present

## 2019-05-05 DIAGNOSIS — Z955 Presence of coronary angioplasty implant and graft: Secondary | ICD-10-CM

## 2019-05-05 DIAGNOSIS — Z79899 Other long term (current) drug therapy: Secondary | ICD-10-CM

## 2019-05-05 DIAGNOSIS — F17209 Nicotine dependence, unspecified, with unspecified nicotine-induced disorders: Secondary | ICD-10-CM | POA: Diagnosis present

## 2019-05-05 DIAGNOSIS — R0600 Dyspnea, unspecified: Secondary | ICD-10-CM

## 2019-05-05 DIAGNOSIS — J449 Chronic obstructive pulmonary disease, unspecified: Secondary | ICD-10-CM | POA: Diagnosis present

## 2019-05-05 DIAGNOSIS — I472 Ventricular tachycardia: Secondary | ICD-10-CM | POA: Diagnosis not present

## 2019-05-05 DIAGNOSIS — R7303 Prediabetes: Secondary | ICD-10-CM | POA: Diagnosis not present

## 2019-05-05 DIAGNOSIS — J439 Emphysema, unspecified: Secondary | ICD-10-CM | POA: Diagnosis not present

## 2019-05-05 DIAGNOSIS — R0609 Other forms of dyspnea: Secondary | ICD-10-CM

## 2019-05-05 DIAGNOSIS — J432 Centrilobular emphysema: Secondary | ICD-10-CM

## 2019-05-05 DIAGNOSIS — Z7951 Long term (current) use of inhaled steroids: Secondary | ICD-10-CM | POA: Diagnosis not present

## 2019-05-05 DIAGNOSIS — F419 Anxiety disorder, unspecified: Secondary | ICD-10-CM | POA: Diagnosis present

## 2019-05-05 DIAGNOSIS — Z91041 Radiographic dye allergy status: Secondary | ICD-10-CM | POA: Diagnosis not present

## 2019-05-05 DIAGNOSIS — I10 Essential (primary) hypertension: Secondary | ICD-10-CM | POA: Diagnosis present

## 2019-05-05 DIAGNOSIS — I4901 Ventricular fibrillation: Secondary | ICD-10-CM | POA: Diagnosis not present

## 2019-05-05 DIAGNOSIS — I2102 ST elevation (STEMI) myocardial infarction involving left anterior descending coronary artery: Secondary | ICD-10-CM | POA: Diagnosis not present

## 2019-05-05 DIAGNOSIS — I213 ST elevation (STEMI) myocardial infarction of unspecified site: Secondary | ICD-10-CM | POA: Diagnosis not present

## 2019-05-05 DIAGNOSIS — Z85118 Personal history of other malignant neoplasm of bronchus and lung: Secondary | ICD-10-CM

## 2019-05-05 DIAGNOSIS — I2109 ST elevation (STEMI) myocardial infarction involving other coronary artery of anterior wall: Secondary | ICD-10-CM

## 2019-05-05 DIAGNOSIS — J9811 Atelectasis: Secondary | ICD-10-CM | POA: Diagnosis not present

## 2019-05-05 DIAGNOSIS — K5090 Crohn's disease, unspecified, without complications: Secondary | ICD-10-CM | POA: Diagnosis present

## 2019-05-05 DIAGNOSIS — I252 Old myocardial infarction: Secondary | ICD-10-CM

## 2019-05-05 DIAGNOSIS — I462 Cardiac arrest due to underlying cardiac condition: Secondary | ICD-10-CM | POA: Diagnosis not present

## 2019-05-05 DIAGNOSIS — Z923 Personal history of irradiation: Secondary | ICD-10-CM | POA: Diagnosis not present

## 2019-05-05 DIAGNOSIS — Z681 Body mass index (BMI) 19 or less, adult: Secondary | ICD-10-CM

## 2019-05-05 DIAGNOSIS — Z20828 Contact with and (suspected) exposure to other viral communicable diseases: Secondary | ICD-10-CM | POA: Diagnosis present

## 2019-05-05 DIAGNOSIS — E785 Hyperlipidemia, unspecified: Secondary | ICD-10-CM | POA: Diagnosis present

## 2019-05-05 DIAGNOSIS — Z79891 Long term (current) use of opiate analgesic: Secondary | ICD-10-CM | POA: Diagnosis not present

## 2019-05-05 DIAGNOSIS — R079 Chest pain, unspecified: Secondary | ICD-10-CM | POA: Diagnosis not present

## 2019-05-05 DIAGNOSIS — R64 Cachexia: Secondary | ICD-10-CM | POA: Diagnosis present

## 2019-05-05 HISTORY — PX: CORONARY/GRAFT ACUTE MI REVASCULARIZATION: CATH118305

## 2019-05-05 HISTORY — DX: ST elevation (STEMI) myocardial infarction involving other coronary artery of anterior wall: I21.09

## 2019-05-05 HISTORY — DX: Nicotine dependence, unspecified, with unspecified nicotine-induced disorders: F17.209

## 2019-05-05 HISTORY — PX: LEFT HEART CATH AND CORONARY ANGIOGRAPHY: CATH118249

## 2019-05-05 HISTORY — DX: Old myocardial infarction: I25.2

## 2019-05-05 LAB — CBC WITH DIFFERENTIAL/PLATELET
Abs Immature Granulocytes: 0.04 10*3/uL (ref 0.00–0.07)
Basophils Absolute: 0.1 10*3/uL (ref 0.0–0.1)
Basophils Relative: 1 %
Eosinophils Absolute: 0.1 10*3/uL (ref 0.0–0.5)
Eosinophils Relative: 1 %
HCT: 47.3 % (ref 39.0–52.0)
Hemoglobin: 15.6 g/dL (ref 13.0–17.0)
Immature Granulocytes: 0 %
Lymphocytes Relative: 10 %
Lymphs Abs: 1.1 10*3/uL (ref 0.7–4.0)
MCH: 28.3 pg (ref 26.0–34.0)
MCHC: 33 g/dL (ref 30.0–36.0)
MCV: 85.8 fL (ref 80.0–100.0)
Monocytes Absolute: 1.2 10*3/uL — ABNORMAL HIGH (ref 0.1–1.0)
Monocytes Relative: 11 %
Neutro Abs: 8.4 10*3/uL — ABNORMAL HIGH (ref 1.7–7.7)
Neutrophils Relative %: 77 %
Platelets: 286 10*3/uL (ref 150–400)
RBC: 5.51 MIL/uL (ref 4.22–5.81)
RDW: 13.9 % (ref 11.5–15.5)
WBC: 10.9 10*3/uL — ABNORMAL HIGH (ref 4.0–10.5)
nRBC: 0 % (ref 0.0–0.2)

## 2019-05-05 LAB — COMPREHENSIVE METABOLIC PANEL
ALT: 27 U/L (ref 0–44)
AST: 34 U/L (ref 15–41)
Albumin: 3.4 g/dL — ABNORMAL LOW (ref 3.5–5.0)
Alkaline Phosphatase: 81 U/L (ref 38–126)
Anion gap: 13 (ref 5–15)
BUN: 8 mg/dL (ref 6–20)
CO2: 24 mmol/L (ref 22–32)
Calcium: 9 mg/dL (ref 8.9–10.3)
Chloride: 100 mmol/L (ref 98–111)
Creatinine, Ser: 1.1 mg/dL (ref 0.61–1.24)
GFR calc Af Amer: >60 mL/min (ref >60)
GFR calc non Af Amer: >60 mL/min (ref >60)
Glucose, Bld: 157 mg/dL — ABNORMAL HIGH (ref 70–99)
Potassium: 2.9 mmol/L — ABNORMAL LOW (ref 3.5–5.1)
Sodium: 137 mmol/L (ref 135–145)
Total Bilirubin: 0.8 mg/dL (ref 0.3–1.2)
Total Protein: 7.3 g/dL (ref 6.5–8.1)

## 2019-05-05 LAB — HEMOGLOBIN A1C
Hgb A1c MFr Bld: 5.4 % (ref 4.8–5.6)
Mean Plasma Glucose: 108.28 mg/dL

## 2019-05-05 LAB — LIPID PANEL
Cholesterol: 117 mg/dL (ref 0–200)
HDL: 33 mg/dL — ABNORMAL LOW (ref >40)
LDL Cholesterol: 56 mg/dL (ref 0–99)
Total CHOL/HDL Ratio: 3.5 RATIO
Triglycerides: 141 mg/dL (ref <150)
VLDL: 28 mg/dL (ref 0–40)

## 2019-05-05 LAB — CREATININE, SERUM
Creatinine, Ser: 1.14 mg/dL (ref 0.61–1.24)
GFR calc Af Amer: >60 mL/min (ref >60)
GFR calc non Af Amer: >60 mL/min (ref >60)

## 2019-05-05 LAB — I-STAT CHEM 8, ED
BUN: 11 mg/dL (ref 6–20)
Calcium, Ion: 1.22 mmol/L (ref 1.15–1.40)
Chloride: 98 mmol/L (ref 98–111)
Creatinine, Ser: 1.2 mg/dL (ref 0.61–1.24)
Glucose, Bld: 141 mg/dL — ABNORMAL HIGH (ref 70–99)
HCT: 53 % — ABNORMAL HIGH (ref 39.0–52.0)
Hemoglobin: 18 g/dL — ABNORMAL HIGH (ref 13.0–17.0)
Potassium: 3 mmol/L — ABNORMAL LOW (ref 3.5–5.1)
Sodium: 140 mmol/L (ref 135–145)
TCO2: 27 mmol/L (ref 22–32)

## 2019-05-05 LAB — TROPONIN I (HIGH SENSITIVITY): Troponin I (High Sensitivity): >27000 ng/L (ref <18)

## 2019-05-05 LAB — ECHOCARDIOGRAM COMPLETE
Height: 71 in
Weight: 2384.5 oz

## 2019-05-05 LAB — MRSA PCR SCREENING: MRSA by PCR: POSITIVE — AB

## 2019-05-05 LAB — APTT: aPTT: 31 seconds (ref 24–36)

## 2019-05-05 LAB — PROTIME-INR
INR: 1.1 (ref 0.8–1.2)
Prothrombin Time: 13.9 seconds (ref 11.4–15.2)

## 2019-05-05 LAB — SARS CORONAVIRUS 2 BY RT PCR (HOSPITAL ORDER, PERFORMED IN ~~LOC~~ HOSPITAL LAB): SARS Coronavirus 2: NEGATIVE

## 2019-05-05 SURGERY — CORONARY/GRAFT ACUTE MI REVASCULARIZATION
Anesthesia: LOCAL

## 2019-05-05 MED ORDER — HEPARIN (PORCINE) IN NACL 1000-0.9 UT/500ML-% IV SOLN
INTRAVENOUS | Status: AC
  Filled 2019-05-05: qty 500

## 2019-05-05 MED ORDER — ASPIRIN 81 MG PO CHEW
CHEWABLE_TABLET | ORAL | Status: AC
  Administered 2019-05-06: 81 mg via ORAL
  Filled 2019-05-05: qty 4

## 2019-05-05 MED ORDER — SODIUM CHLORIDE 0.9% FLUSH
3.0000 mL | Freq: Two times a day (BID) | INTRAVENOUS | Status: DC
  Administered 2019-05-06: 10:00:00 3 mL via INTRAVENOUS

## 2019-05-05 MED ORDER — TICAGRELOR 90 MG PO TABS
ORAL_TABLET | ORAL | Status: AC
  Filled 2019-05-05: qty 2

## 2019-05-05 MED ORDER — HEPARIN (PORCINE) IN NACL 1000-0.9 UT/500ML-% IV SOLN
INTRAVENOUS | Status: DC | PRN
  Administered 2019-05-05 (×2): 500 mL

## 2019-05-05 MED ORDER — HEPARIN SODIUM (PORCINE) 5000 UNIT/ML IJ SOLN
4000.0000 [IU] | Freq: Once | INTRAMUSCULAR | Status: AC
  Administered 2019-05-05: 03:00:00 4000 [IU] via INTRAVENOUS

## 2019-05-05 MED ORDER — SODIUM CHLORIDE 0.9% FLUSH: 3.0000 mL | INTRAVENOUS | Status: DC | PRN

## 2019-05-05 MED ORDER — FENTANYL CITRATE (PF) 100 MCG/2ML IJ SOLN
INTRAMUSCULAR | Status: AC
  Filled 2019-05-05: qty 2

## 2019-05-05 MED ORDER — SODIUM CHLORIDE 0.9 % IV SOLN: INTRAVENOUS | Status: DC

## 2019-05-05 MED ORDER — TICAGRELOR 90 MG PO TABS
ORAL_TABLET | ORAL | Status: DC | PRN
  Administered 2019-05-05: 180 mg via ORAL

## 2019-05-05 MED ORDER — TICAGRELOR 90 MG PO TABS
90.0000 mg | ORAL_TABLET | Freq: Two times a day (BID) | ORAL | Status: DC
  Administered 2019-05-05 – 2019-05-06 (×3): 90 mg via ORAL
  Filled 2019-05-05 (×3): qty 1

## 2019-05-05 MED ORDER — FAMOTIDINE IN NACL 20-0.9 MG/50ML-% IV SOLN
INTRAVENOUS | Status: AC | PRN
  Administered 2019-05-05: 20 mg via INTRAVENOUS

## 2019-05-05 MED ORDER — METHYLPREDNISOLONE SODIUM SUCC 125 MG IJ SOLR
INTRAMUSCULAR | Status: DC | PRN
  Administered 2019-05-05: 125 mg via INTRAVENOUS

## 2019-05-05 MED ORDER — TIROFIBAN HCL IN NACL 5-0.9 MG/100ML-% IV SOLN
INTRAVENOUS | Status: AC | PRN
  Administered 2019-05-05: 0.15 ug/kg/min via INTRAVENOUS

## 2019-05-05 MED ORDER — SODIUM CHLORIDE 0.9 % IV SOLN: 250.0000 mL | INTRAVENOUS | Status: DC | PRN

## 2019-05-05 MED ORDER — DIPHENHYDRAMINE HCL 50 MG/ML IJ SOLN
INTRAMUSCULAR | Status: AC
  Filled 2019-05-05: qty 1

## 2019-05-05 MED ORDER — LIDOCAINE HCL (PF) 1 % IJ SOLN
INTRAMUSCULAR | Status: DC | PRN
  Administered 2019-05-05: 15 mL

## 2019-05-05 MED ORDER — TIROFIBAN HCL IN NACL 5-0.9 MG/100ML-% IV SOLN
INTRAVENOUS | Status: AC
  Filled 2019-05-05: qty 100

## 2019-05-05 MED ORDER — FENTANYL CITRATE (PF) 100 MCG/2ML IJ SOLN
INTRAMUSCULAR | Status: DC | PRN
  Administered 2019-05-05: 25 ug via INTRAVENOUS

## 2019-05-05 MED ORDER — SODIUM CHLORIDE 0.9 % WEIGHT BASED INFUSION
1.0000 mL/kg/h | INTRAVENOUS | Status: AC
  Administered 2019-05-05: 05:00:00 1 mL/kg/h via INTRAVENOUS

## 2019-05-05 MED ORDER — ASPIRIN 81 MG PO CHEW
81.0000 mg | CHEWABLE_TABLET | Freq: Every day | ORAL | Status: DC
  Administered 2019-05-05 – 2019-05-06 (×2): 81 mg via ORAL
  Filled 2019-05-05 (×2): qty 1

## 2019-05-05 MED ORDER — METHYLPREDNISOLONE SODIUM SUCC 125 MG IJ SOLR
INTRAMUSCULAR | Status: AC
  Filled 2019-05-05: qty 4

## 2019-05-05 MED ORDER — NITROGLYCERIN 1 MG/10 ML FOR IR/CATH LAB
INTRA_ARTERIAL | Status: AC
  Filled 2019-05-05: qty 10

## 2019-05-05 MED ORDER — HEPARIN SODIUM (PORCINE) 1000 UNIT/ML IJ SOLN
INTRAMUSCULAR | Status: DC | PRN
  Administered 2019-05-05: 3000 [IU] via INTRAVENOUS
  Administered 2019-05-05: 4000 [IU] via INTRAVENOUS

## 2019-05-05 MED ORDER — ATORVASTATIN CALCIUM 80 MG PO TABS
80.0000 mg | ORAL_TABLET | Freq: Every day | ORAL | Status: DC
  Administered 2019-05-05 – 2019-05-06 (×2): 80 mg via ORAL
  Filled 2019-05-05 (×2): qty 1

## 2019-05-05 MED ORDER — HYDRALAZINE HCL 20 MG/ML IJ SOLN: 10.0000 mg | INTRAMUSCULAR | Status: AC | PRN

## 2019-05-05 MED ORDER — CHLORHEXIDINE GLUCONATE CLOTH 2 % EX PADS
6.0000 | MEDICATED_PAD | Freq: Every day | CUTANEOUS | Status: DC
  Administered 2019-05-05 – 2019-05-06 (×2): 6 via TOPICAL

## 2019-05-05 MED ORDER — IOHEXOL 350 MG/ML SOLN
INTRAVENOUS | Status: DC | PRN
  Administered 2019-05-05: 110 mL via INTRACARDIAC

## 2019-05-05 MED ORDER — HEPARIN SODIUM (PORCINE) 5000 UNIT/ML IJ SOLN
5000.0000 [IU] | Freq: Three times a day (TID) | INTRAMUSCULAR | Status: DC
  Administered 2019-05-05 – 2019-05-06 (×3): 5000 [IU] via SUBCUTANEOUS
  Filled 2019-05-05 (×3): qty 1

## 2019-05-05 MED ORDER — ACETAMINOPHEN 325 MG PO TABS
650.0000 mg | ORAL_TABLET | ORAL | Status: DC | PRN
  Administered 2019-05-05 – 2019-05-06 (×4): 650 mg via ORAL
  Filled 2019-05-05 (×4): qty 2

## 2019-05-05 MED ORDER — HEPARIN SODIUM (PORCINE) 1000 UNIT/ML IJ SOLN
INTRAMUSCULAR | Status: AC
  Filled 2019-05-05: qty 1

## 2019-05-05 MED ORDER — DIAZEPAM 2 MG PO TABS
2.0000 mg | ORAL_TABLET | ORAL | Status: DC | PRN
  Administered 2019-05-05 – 2019-05-06 (×3): 2 mg via ORAL
  Filled 2019-05-05 (×3): qty 1

## 2019-05-05 MED ORDER — HEPARIN (PORCINE) IN NACL 1000-0.9 UT/500ML-% IV SOLN
INTRAVENOUS | Status: AC
  Filled 2019-05-05: qty 1000

## 2019-05-05 MED ORDER — LIDOCAINE HCL (PF) 1 % IJ SOLN
INTRAMUSCULAR | Status: AC
  Filled 2019-05-05: qty 30

## 2019-05-05 MED ORDER — METOPROLOL SUCCINATE ER 50 MG PO TB24
50.0000 mg | ORAL_TABLET | Freq: Every day | ORAL | Status: DC
  Administered 2019-05-06: 10:00:00 50 mg via ORAL
  Filled 2019-05-05: qty 1

## 2019-05-05 MED ORDER — VERAPAMIL HCL 2.5 MG/ML IV SOLN
INTRAVENOUS | Status: AC
  Filled 2019-05-05: qty 2

## 2019-05-05 MED ORDER — DIPHENHYDRAMINE HCL 50 MG/ML IJ SOLN
INTRAMUSCULAR | Status: DC | PRN
  Administered 2019-05-05: 25 mg via INTRAVENOUS

## 2019-05-05 MED ORDER — POTASSIUM CHLORIDE CRYS ER 20 MEQ PO TBCR
60.0000 meq | EXTENDED_RELEASE_TABLET | Freq: Once | ORAL | Status: AC
  Administered 2019-05-05: 06:00:00 60 meq via ORAL
  Filled 2019-05-05: qty 3

## 2019-05-05 MED ORDER — NICOTINE 21 MG/24HR TD PT24
21.0000 mg | MEDICATED_PATCH | Freq: Every day | TRANSDERMAL | Status: DC
  Administered 2019-05-05 – 2019-05-06 (×2): 21 mg via TRANSDERMAL
  Filled 2019-05-05 (×2): qty 1

## 2019-05-05 MED ORDER — LOSARTAN POTASSIUM 25 MG PO TABS
25.0000 mg | ORAL_TABLET | Freq: Every evening | ORAL | Status: DC
  Administered 2019-05-05 – 2019-05-06 (×2): 25 mg via ORAL
  Filled 2019-05-05 (×2): qty 1

## 2019-05-05 MED ORDER — ONDANSETRON HCL 4 MG/2ML IJ SOLN: 4.0000 mg | Freq: Four times a day (QID) | INTRAMUSCULAR | Status: DC | PRN

## 2019-05-05 MED ORDER — TIROFIBAN (AGGRASTAT) BOLUS VIA INFUSION
INTRAVENOUS | Status: DC | PRN
  Administered 2019-05-05: 1587.5 ug via INTRAVENOUS

## 2019-05-05 MED ORDER — NITROGLYCERIN 1 MG/10 ML FOR IR/CATH LAB
INTRA_ARTERIAL | Status: DC | PRN
  Administered 2019-05-05 (×2): 200 ug via INTRACORONARY

## 2019-05-05 MED ORDER — METOPROLOL SUCCINATE ER 25 MG PO TB24
25.0000 mg | ORAL_TABLET | Freq: Every day | ORAL | Status: DC
  Administered 2019-05-05: 10:00:00 25 mg via ORAL
  Filled 2019-05-05: qty 1

## 2019-05-05 SURGICAL SUPPLY — 21 items
BALLN SAPPHIRE 2.5X12 (BALLOONS) ×2
BALLOON SAPPHIRE 2.5X12 (BALLOONS) ×1 IMPLANT
CATH EXTRAC PRONTO 5.5F 138CM (CATHETERS) ×2 IMPLANT
CATH INFINITI JR4 5F (CATHETERS) ×2 IMPLANT
CATH LAUNCHER 5F NOTO (CATHETERS) ×1 IMPLANT
CATH VISTA GUIDE 6FR XB3.5 (CATHETERS) ×2 IMPLANT
CATHETER LAUNCHER 5F NOTO (CATHETERS) ×2
CLOSURE MYNX CONTROL 6F/7F (Vascular Products) ×2 IMPLANT
GLIDESHEATH SLEND A-KIT 6F 22G (SHEATH) ×2 IMPLANT
GUIDEWIRE INQWIRE 1.5J.035X260 (WIRE) ×1 IMPLANT
INQWIRE 1.5J .035X260CM (WIRE) ×2
KIT ENCORE 26 ADVANTAGE (KITS) ×2 IMPLANT
KIT HEART LEFT (KITS) ×2 IMPLANT
PACK CARDIAC CATHETERIZATION (CUSTOM PROCEDURE TRAY) ×2 IMPLANT
SHEATH PINNACLE 6F 10CM (SHEATH) ×4 IMPLANT
STENT SYNERGY DES 3.5X20 (Permanent Stent) ×2 IMPLANT
STENT SYNERGY DES 3X12 (Permanent Stent) ×2 IMPLANT
TRANSDUCER W/STOPCOCK (MISCELLANEOUS) ×2 IMPLANT
TUBING CIL FLEX 10 FLL-RA (TUBING) ×2 IMPLANT
WIRE COUGAR XT STRL 190CM (WIRE) ×2 IMPLANT
WIRE EMERALD 3MM-J .035X150CM (WIRE) ×2 IMPLANT

## 2019-05-05 NOTE — ED Notes (Signed)
Unsuccessful blood draw

## 2019-05-05 NOTE — Progress Notes (Signed)
  Echocardiogram 2D Echocardiogram has been performed.  Jimmy Nelson 05/05/2019, 9:48 AM

## 2019-05-05 NOTE — ED Notes (Signed)
Pt given 50 mcg fentanyl and 324 asa.

## 2019-05-05 NOTE — ED Provider Notes (Signed)
[place] CATH LAB Provider Note   CSN: [id number] Arrival date & time: 05/05/19  0227    History   Chief Complaint No chief complaint on file.   HPI Jimmy Nelson. is a 53 y.o. male.     Patient presents to the emergency department from home by ambulance for evaluation of chest pain.  Patient had onset of severe, substernal chest pain around 1:30 AM.  He was brought to the ER by ambulance with continued pain.  Tracing performed by EMS consistent with anterior STEMI.  Patient arrives as a code STEMI.  Pain is 10 out of 10 without radiation.     Past Medical History:  Diagnosis Date  . Anemia   . COPD (chronic obstructive pulmonary disease)   . Crohn's disease Deteriorating disk  . Depression   . Lung cancer   . Mental disorder   . Sleep apnea     Patient Active Problem List   Diagnosis Date Noted  . Acute MI, anterolateral wall, initial episode of care ([place]) 05/05/2019  . Tobacco use disorder, continuous 05/05/2019  . COPD (chronic obstructive pulmonary disease) ([place])   . Alcohol abuse 12/30/2012  . Depressive disorder, not elsewhere classified 12/30/2012    Past Surgical History:  Procedure Laterality Date  . APPENDECTOMY    . EYE SURGERY          Home Medications    Prior to Admission medications   Medication Sig Start Date End Date Taking? Authorizing Provider  albuterol (PROVENTIL HFA;VENTOLIN HFA) 108 (90 BASE) MCG/ACT inhaler Inhale 2 puffs into the lungs every 4 (four) hours as needed for wheezing.    [provider]  budesonide (ENTOCORT EC) 3 MG 24 hr capsule Take 3 capsules (9 mg total) by mouth daily. 01/01/13   Patrecia Pour, NP  DULoxetine (CYMBALTA) 30 MG capsule Take 3 capsules (90 mg total) by mouth daily. 01/01/13   Patrecia Pour, NP  esomeprazole (NEXIUM) 40 MG capsule Take 1 capsule (40 mg total) by mouth daily before breakfast. 01/01/13   Patrecia Pour, NP  fentaNYL (DURAGESIC - DOSED  MCG/HR) 100 MCG/HR Place 1 patch (100 mcg total) onto the skin every 3 (three) days. Place with 25 mcg patch to = 125 mcg dose last filled 26 march 14 # 10 01/01/13   Patrecia Pour, NP  fentaNYL (DURAGESIC - DOSED MCG/HR) 25 MCG/HR Place 1 patch (25 mcg total) onto the skin every 3 (three) days. Apply with 166mg patch to = 125 mcg dose last 263m14 #10 01/01/13   LoPatrecia PourNP  ferrous sulfate 325 (65 FE) MG tablet Take 1 tablet (325 mg total) by mouth daily with breakfast. 01/01/13   LoPatrecia PourNP  Fluticasone-Salmeterol (ADVAIR) 500-50 MCG/DOSE AEPB Inhale 1 puff into the lungs every 12 (twelve) hours. 01/01/13   LoPatrecia PourNP  Multiple Vitamins-Minerals (MULTIVITAMIN WITH MINERALS) tablet Take 1 tablet by mouth daily. 01/01/13   LoPatrecia PourNP  oxyCODONE (OXY IR/ROXICODONE) 5 MG immediate release tablet Take 2 tablets (10 mg total) by mouth every 4 (four) hours as needed for pain (nte 4 tabs in 24 hours). 01/01/13   LoPatrecia PourNP  pregabalin (LYRICA) 75 MG capsule Take 1 capsule (75 mg total) by mouth 3 (three) times daily. 01/01/13   LoPatrecia PourNP  tiotropium (SPIRIVA) 18 MCG inhalation capsule Place 1 capsule (18 mcg total) into inhaler and inhale daily. 01/01/13  Patrecia Pour, NP  traZODone (DESYREL) 50 MG tablet Take 1 tablet (50 mg total) by mouth at bedtime. 01/01/13   Patrecia Pour, NP    Family History No family history on file.  Social History Social History   Tobacco Use  . Smoking status: Current Every Day Smoker    Types: Cigarettes  Substance Use Topics  . Alcohol use: Yes    Comment: Pt reports  drinking etoh 1-2x per month  . Drug use: No     Allergies   Iodinated diagnostic agents   Review of Systems Review of Systems  Cardiovascular: Positive for chest pain.  All other systems reviewed and are negative.    Physical Exam Updated Vital Signs BP 124/86   Pulse 99   Temp (!) 96.7 F (35.9 C) (Tympanic)   Resp 18   Ht 5' 11"  (1.803  m)   Wt 63.5 kg   SpO2 94%   BMI 19.53 kg/m   Physical Exam Vitals signs and nursing note reviewed.  Constitutional:      General: He is in acute distress.     Appearance: Normal appearance. He is well-developed. He is ill-appearing.     Comments: Appears much older than stated age  HENT:     Head: Normocephalic and atraumatic.     Right Ear: Hearing normal.     Left Ear: Hearing normal.     Nose: Nose normal.  Eyes:     Conjunctiva/sclera: Conjunctivae normal.     Pupils: Pupils are equal, round, and reactive to light.  Neck:     Musculoskeletal: Normal range of motion and neck supple.  Cardiovascular:     Rate and Rhythm: Regular rhythm.     Heart sounds: S1 normal and S2 normal. No murmur. No friction rub. No gallop.   Pulmonary:     Effort: Pulmonary effort is normal. No respiratory distress.     Breath sounds: Normal breath sounds.  Chest:     Chest wall: No tenderness.  Abdominal:     General: Bowel sounds are normal.     Palpations: Abdomen is soft.     Tenderness: There is no abdominal tenderness. There is no guarding or rebound. Negative signs include Murphy's sign and McBurney's sign.     Hernia: No hernia is present.  Musculoskeletal: Normal range of motion.  Skin:    General: Skin is warm and dry.     Findings: No rash.     Comments: Superficial abrasion on left elbow and left knee, scabbed over  Neurological:     Mental Status: He is alert and oriented to person, place, and time.     GCS: GCS eye subscore is 4. GCS verbal subscore is 5. GCS motor subscore is 6.     Cranial Nerves: No cranial nerve deficit.     Sensory: No sensory deficit.     Coordination: Coordination normal.  Psychiatric:        Speech: Speech normal.        Behavior: Behavior normal.        Thought Content: Thought content normal.      ED Treatments / Results  Labs (all labs ordered are listed, but only abnormal results are displayed) Labs Reviewed  I-STAT CHEM 8, ED -  Abnormal; Notable for the following components:      Result Value   Potassium 3.0 (*)    Glucose, Bld 141 (*)    Hemoglobin 18.0 (*)    HCT 53.0 (*)  All other components within normal limits  SARS CORONAVIRUS 2 (HOSPITAL ORDER, [facility])  MRSA PCR SCREENING  CBC WITH DIFFERENTIAL/PLATELET  PROTIME-INR  APTT  COMPREHENSIVE METABOLIC PANEL  LIPID PANEL  COMPREHENSIVE METABOLIC PANEL  LIPID PANEL  HEMOGLOBIN A1C  CBC  PROTIME-INR  APTT  TROPONIN I (HIGH SENSITIVITY)  TROPONIN I (HIGH SENSITIVITY)  TROPONIN I (HIGH SENSITIVITY)    EKG None  Radiology No results found.  Procedures Procedures (including critical care time)  Medications Ordered in ED Medications  0.9 %  sodium chloride infusion (has no administration in time range)  fentaNYL (SUBLIMAZE) 100 MCG/2ML injection (has no administration in time range)  aspirin 81 MG chewable tablet (has no administration in time range)  heparin injection 4,000 Units (4,000 Units Intravenous Given 05/05/19 0235)  famotidine (PEPCID) IVPB 20 mg premix (20 mg Intravenous New Bag/Given 05/05/19 0300)  tirofiban (AGGRASTAT) infusion 50 mcg/mL 100 mL (0.15 mcg/kg/min  63.5 kg Intravenous New Bag/Given 05/05/19 0328)  verapamil (ISOPTIN) 2.5 MG/ML injection (has no administration in time range)  lidocaine (PF) (XYLOCAINE) 1 % injection (has no administration in time range)  Heparin (Porcine) in NaCl 1000-0.9 UT/500ML-% SOLN (has no administration in time range)  heparin 1000 UNIT/ML injection (has no administration in time range)  methylPREDNISolone sodium succinate (SOLU-MEDROL) 125 mg/2 mL injection (has no administration in time range)  diphenhydrAMINE (BENADRYL) 50 MG/ML injection (has no administration in time range)  tirofiban (AGGRASTAT) 5-0.9 MG/100ML-% injection (has no administration in time range)  nitroGLYCERIN 100 mcg/mL intra-arterial injection (has no administration in time range)  fentaNYL  (SUBLIMAZE) 100 MCG/2ML injection (has no administration in time range)  Heparin (Porcine) in NaCl 1000-0.9 UT/500ML-% SOLN (has no administration in time range)  ticagrelor (BRILINTA) 90 MG tablet (has no administration in time range)     Initial Impression / Assessment and Plan / ED Course  I have reviewed the triage vital signs and the nursing notes.  Pertinent labs & imaging results that were available during my care of the patient were reviewed by me and considered in my medical decision making (see chart for details).        Patient presents as a code STEMI secondary to abnormal EKG.  EKG here in the ER shows ST elevations in the anterior and septal leads consistent with MI.  Blood pressure borderline, no additional nitroglycerin given.  Patient given aspirin because the dose he took at home could not be confirmed.  He was given a dose of fentanyl for further pain control.  STEMI protocol followed and patient was transported to Cath Lab.  CRITICAL CARE Performed by: Orpah Greek   Total critical care time: 35 minutes  Critical care time was exclusive of separately billable procedures and treating other patients.  Critical care was necessary to treat or prevent imminent or life-threatening deterioration.  Critical care was time spent personally by me on the following activities: development of treatment plan with patient and/or surrogate as well as nursing, discussions with consultants, evaluation of patient's response to treatment, examination of patient, obtaining history from patient or surrogate, ordering and performing treatments and interventions, ordering and review of laboratory studies, ordering and review of radiographic studies, pulse oximetry and re-evaluation of patient's condition.   Final Clinical Impressions(s) / ED Diagnoses   Final diagnoses:  ST elevation myocardial infarction (STEMI), unspecified artery [facility])    ED Discharge Orders    None        Pollina, Gwenyth Allegra, MD  05/05/19 0417  

## 2019-05-05 NOTE — H&P (Signed)
CARDIOLOGY ADMIT NOTE   Jimmy Gutman Sr. is an 53 y.o. male.   Chief complaints: Chest pain that started around midnight. HPI: Jimmy Bebout Sr.  is a 53 y.o. male  with history of lung cancer status post radiation therapy in the remote past in remission, ongoing tobacco use disorder, COPD, prediabetic, admitted to the hospital with ongoing chest pain, woke up from bed had a bowel movement and started having severe crushing 10/10 chest discomfort, he took aspirin and called the EMS, chest pain episode started around 12-12 30 midnight.  On presentation to the emergency room he was writhing in pain.  He was in distress.  He denies any drug use, smokes about a pack of cigarettes a day, no history of alcohol abuse now has stopped drinking alcohol.  He is not on any home medications.  Past Medical History:  Diagnosis Date  . Anemia   . COPD (chronic obstructive pulmonary disease)   . Crohn's disease Deteriorating disk  . Depression   . Lung cancer   . Mental disorder   . Sleep apnea     Past Surgical History:  Procedure Laterality Date  . APPENDECTOMY    . EYE SURGERY      Social History   Socioeconomic History  . Marital status: Legally Separated    Spouse name: Not on file  . Number of children: Not on file  . Years of education: Not on file  . Highest education level: Not on file  Occupational History  . Not on file  Social Needs  . Financial resource strain: Not on file  . Food insecurity    Worry: Not on file    Inability: Not on file  . Transportation needs    Medical: Not on file    Non-medical: Not on file  Tobacco Use  . Smoking status: Current Every Day Smoker    Types: Cigarettes  Substance and Sexual Activity  . Alcohol use: Yes    Comment: Pt reports  drinking etoh 1-2x per month  . Drug use: No  . Sexual activity: Yes  Lifestyle  . Physical activity    Days per week: Not on file    Minutes per session: Not on file  . Stress: Not on  file  Relationships  . Social Herbalist on phone: Not on file    Gets together: Not on file    Attends religious service: Not on file    Active member of club or organization: Not on file    Attends meetings of clubs or organizations: Not on file    Relationship status: Not on file  . Intimate partner violence    Fear of current or ex partner: Not on file    Emotionally abused: Not on file    Physically abused: Not on file    Forced sexual activity: Not on file  Other Topics Concern  . Not on file  Social History Narrative  . Not on file    No current facility-administered medications on file prior to encounter.    Current Outpatient Medications on File Prior to Encounter  Medication Sig Dispense Refill  . albuterol (PROVENTIL HFA;VENTOLIN HFA) 108 (90 BASE) MCG/ACT inhaler Inhale 2 puffs into the lungs every 4 (four) hours as needed for wheezing.    . budesonide (ENTOCORT EC) 3 MG 24 hr capsule Take 3 capsules (9 mg total) by mouth daily. 30 capsule 0  . DULoxetine (CYMBALTA) 30 MG capsule Take  3 capsules (90 mg total) by mouth daily. 90 capsule 0  . esomeprazole (NEXIUM) 40 MG capsule Take 1 capsule (40 mg total) by mouth daily before breakfast. 30 capsule 0  . fentaNYL (DURAGESIC - DOSED MCG/HR) 100 MCG/HR Place 1 patch (100 mcg total) onto the skin every 3 (three) days. Place with 25 mcg patch to = 125 mcg dose last filled 26 march 14 # 10 1 patch 0  . fentaNYL (DURAGESIC - DOSED MCG/HR) 25 MCG/HR Place 1 patch (25 mcg total) onto the skin every 3 (three) days. Apply with 163mg patch to = 125 mcg dose last 250m14 #10 1 patch 0  . ferrous sulfate 325 (65 FE) MG tablet Take 1 tablet (325 mg total) by mouth daily with breakfast. 30 tablet 0  . Fluticasone-Salmeterol (ADVAIR) 500-50 MCG/DOSE AEPB Inhale 1 puff into the lungs every 12 (twelve) hours. 60 each 0  . Multiple Vitamins-Minerals (MULTIVITAMIN WITH MINERALS) tablet Take 1 tablet by mouth daily. 30 tablet 0  .  oxyCODONE (OXY IR/ROXICODONE) 5 MG immediate release tablet Take 2 tablets (10 mg total) by mouth every 4 (four) hours as needed for pain (nte 4 tabs in 24 hours). 1 tablet 0  . pregabalin (LYRICA) 75 MG capsule Take 1 capsule (75 mg total) by mouth 3 (three) times daily. 1 capsule 0  . tiotropium (SPIRIVA) 18 MCG inhalation capsule Place 1 capsule (18 mcg total) into inhaler and inhale daily. 30 capsule 0  . traZODone (DESYREL) 50 MG tablet Take 1 tablet (50 mg total) by mouth at bedtime. 30 tablet 0    Review of Systems  Constitution: Negative for chills, decreased appetite, malaise/fatigue and weight gain.  Cardiovascular: Positive for chest pain. Negative for dyspnea on exertion, leg swelling and syncope.  Respiratory: Positive for shortness of breath.   Endocrine: Negative for cold intolerance.  Hematologic/Lymphatic: Does not bruise/bleed easily.  Musculoskeletal: Negative for joint swelling.  Gastrointestinal: Negative for abdominal pain, anorexia, change in bowel habit, hematochezia and melena.  Neurological: Negative for headaches and light-headedness.  Psychiatric/Behavioral: Negative for depression and substance abuse.  All other systems reviewed and are negative.   Objective:  Blood pressure (!) 161/107, pulse (!) 110, temperature (!) 96.7 F (35.9 C), temperature source Tympanic, resp. rate 20, height 5' 11"  (1.803 m), weight 63.5 kg, SpO2 99 %. Body mass index is 19.53 kg/m.  Physical Exam  Constitutional: He is oriented to person, place, and time. He appears well-nourished. He appears cachectic. He appears distressed (pain).  Appears older than stated age  HENT:  Head: Atraumatic.  Eyes: Conjunctivae are normal.  Neck: Neck supple. No JVD present. No thyromegaly present.  Cardiovascular: Normal rate, regular rhythm, intact distal pulses and normal pulses. Exam reveals distant heart sounds. Exam reveals no gallop.  No murmur heard. Intact distal pulse. No leg edema. No  JVD.   Pulmonary/Chest: Effort normal. Tachypnea noted. He has no decreased breath sounds.  Chest barrel shaped  Abdominal: Soft. Bowel sounds are normal.  Musculoskeletal: Normal range of motion.  Neurological: He is alert and oriented to person, place, and time.  Skin: Skin is warm and dry.  Psychiatric: He has a normal mood and affect.   Radiology: No results found.  Laboratory Examination:  CMP Latest Ref Rng & Units 05/05/2019  Glucose 70 - 99 mg/dL 141(H)  BUN 6 - 20 mg/dL 11  Creatinine 0.61 - 1.24 mg/dL 1.20  Sodium 135 - 145 mmol/L 140  Potassium 3.5 - 5.1 mmol/L 3.0(L)  Chloride 98 - 111 mmol/L 98   CBC Latest Ref Rng & Units 05/05/2019  Hemoglobin 13.0 - 17.0 g/dL 18.0(H)  Hematocrit 39.0 - 52.0 % 53.0(H)   Lipid Panel  No results found for: CHOL, TRIG, HDL, CHOLHDL, VLDL, LDLCALC, LDLDIRECT HEMOGLOBIN A1C No results found for: HGBA1C, MPG TSH No results for input(s): TSH in the last 8760 hours. Cardiac Panel (last 3 results) No results for input(s): CKTOTAL, CKMB, TROPONINI, RELINDX in the last 72 hours.  Cardiac studies:   Coronary Angiography 05/05/2019: Proximal LAD 1/2% occluded.  S/P aspiration thrombectomy followed by stenting with 3.5 x 20 in the proximal and and in the distal end 3.0 x 12 mm Synergy overlapping stents, TIMI 0 to TIMI-3 flow.  LVEF 10 to 15% with anterolateral akinesis.  Markedly elevated EDP at 27 mmHg.  50% proximal diffuse RCA stenosis, large circumflex coronary artery with minimal disease.  OM 2 which is small is severely diffusely diseased with ostial 99% stenosis.  Assessment:   1.  Acute anterolateral myocardial infarction  EKG 05/05/2019: ST elevation in V1 to V3, acute injury pattern.  No reciprocal changes. 2.  Prediabetes mellitus 3.  History of lung cancer S/P radiation therapy in the remote past in remission 4.  Ongoing tobacco use disorder 5.  COPD with emphysema.  Plan:  Patient in severe writhing pain with ongoing ST  elevations in the anteroseptal leads suggestive of proximal LAD stenosis.  He will be emergently taken to the cardiac catheterization lab.  Further recommendations to follow.  Adrian Prows, MD, [facility] 05/05/2019, 4:01 AM [facility]. [place] Pager: [phone] Office: [phone] If no answer Cell [phone]

## 2019-05-05 NOTE — Progress Notes (Signed)
Subjective:  No specific complaints, states that he has not had any further chest pain, feels very tired.  Intake/Output from previous day:  I/O last 3 completed shifts: In: 181.3 [I.V.:181.3] Out: 500 [Urine:500] Total I/O In: 190.5 [I.V.:190.5] Out: 250 [Urine:250]  Blood pressure (!) 144/107, pulse (!) 115, temperature 98.2 F (36.8 C), temperature source Oral, resp. rate (!) 21, height _0  (1.803 m), weight 67.6 kg, SpO2 99 %. Physical Exam  Constitutional: He is oriented to person, place, and time. He appears well-nourished. He appears cachectic. No distress.  Appears older than stated age  HENT:  Head: Atraumatic.  Eyes: Conjunctivae are normal.  Neck: Neck supple. No JVD present. No thyromegaly present.  Cardiovascular: Normal rate, regular rhythm, intact distal pulses and normal pulses. Exam reveals distant heart sounds. Exam reveals no gallop.  No murmur heard. Intact distal pulse. No leg edema. No JVD.  Right femoral arterial access site in venous access site without any complications or hematoma.  Pulmonary/Chest: Effort normal. Tachypnea noted. He has no decreased breath sounds.  Chest barrel shaped  Abdominal: Soft. Bowel sounds are normal.  Musculoskeletal: Normal range of motion.  Neurological: He is alert and oriented to person, place, and time.  Skin: Skin is warm and dry.  Psychiatric: His mood appears anxious.  Flat affect   Lab Results: BMP BNP (last 3 results) No results for input(s): BNP in the last 8760 hours.  ProBNP (last 3 results) No results for input(s): PROBNP in the last 8760 hours. BMP Latest Ref Rng & Units 05/05/2019 05/05/2019 05/05/2019  Glucose 70 - 99 mg/dL - 157(H) 141(H)  BUN 6 - 20 mg/dL - 8 11  Creatinine 0.61 - 1.24 mg/dL 1.14 1.10 1.20  Sodium 135 - 145 mmol/L - 137 140  Potassium 3.5 - 5.1 mmol/L - 2.9(L) 3.0(L)  Chloride 98 - 111 mmol/L - 100 98  CO2 22 - 32 mmol/L - 24 -  Calcium 8.9 - 10.3 mg/dL - 9.0 -   Hepatic Function  Latest Ref Rng & Units 05/05/2019  Total Protein 6.5 - 8.1 g/dL 7.3  Albumin 3.5 - 5.0 g/dL 3.4(L)  AST 15 - 41 U/L 34  ALT 0 - 44 U/L 27  Alk Phosphatase 38 - 126 U/L 81  Total Bilirubin 0.3 - 1.2 mg/dL 0.8   CBC Latest Ref Rng & Units 05/05/2019 05/05/2019  WBC 4.0 - 10.5 K/uL 10.9(H) -  Hemoglobin 13.0 - 17.0 g/dL 15.6 18.0(H)  Hematocrit 39.0 - 52.0 % 47.3 53.0(H)  Platelets 150 - 400 K/uL 286 -   Lipid Panel     Component Value Date/Time   CHOL 117 05/05/2019 0312   TRIG 141 05/05/2019 0312   HDL 33 (L) 05/05/2019 0312   CHOLHDL 3.5 05/05/2019 0312   VLDL 28 05/05/2019 0312   LDLCALC 56 05/05/2019 0312   Troponin I (High Sensitivity) <18 ng/L >27,000High Panic     HEMOGLOBIN A1C Lab Results  Component Value Date   HGBA1C 5.4 05/05/2019   MPG 108.28 05/05/2019   TSH No results for input(s): TSH in the last 8760 hours. Imaging: No results found.  Cardiac Studies:  Coronary Angiography 05/05/2019: Prox LAD to Mid LAD lesion is 100% stenosed S/P aspiration thrombectomy followed by overlapping 3.5 x 20 and a 3.0 x 12 mm Synergy DES distally, stenosis reduced from 10 percent to 0%.  D1 has ostial 20 to 30% stenosis. Circumflex: Mild disease in the proximal and, OM 2 is very tiny and severely diseased. RCA  is dominant, proximal 50% stenosis. LV: EDP markedly elevated at 27 mmHg.  EF 10 to 15% with anterolateral akinesis.  110 mL contrast utilized, 6.9 minutes of fluoroscopy time.  Recommendation: Patient will be kept in the intensive care unit, he may need a hospital admission for 48 hours or more in view of severe LV dysfunction unless he does well clinically.  I discussed the findings with his wife on the telephone.   EKG 05/05/2019 at 4:48 AM: Sinus tachycardia heart rate of 102 bpm, right atrial enlargement, normal axis.  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  Anteroseptal infarct acute.  No significant change since prior EKG at 2 AM.  Echocardiogram 05/17/2019: 1. The left  ventricle has severely reduced systolic function, with an ejection fraction of 25-30%. The cavity size was normal. Left ventricular diastolic function could not be evaluated. Left ventricular diffuse hypokinesis.  2. The right ventricle has normal systolic function. The cavity was normal. There is no increase in right ventricular wall thickness.  3. Trivial pericardial effusion is present.  4. The aorta is normal in size and structure.  5. The aortic root is normal in size and structure.  Scheduled Meds: . aspirin      . aspirin  81 mg Oral Daily  . atorvastatin  80 mg Oral q1800  . fentaNYL      . heparin  5,000 Units Subcutaneous Q8H  . metoprolol succinate  25 mg Oral Daily  . sodium chloride flush  3 mL Intravenous Q12H  . ticagrelor  90 mg Oral BID   Continuous Infusions: . sodium chloride    . sodium chloride    . sodium chloride 1 mL/kg/hr (05/05/19 0506)   PRN Meds:.sodium chloride, acetaminophen, diazepam, ondansetron (ZOFRAN) IV, sodium chloride flush  Assessment/Plan:  1.  Acute anterolateral wall STEMI S/P primary PCI of culprit LAD with 2 overlapping stents 2.  Hyperlipidemia 3.  Tobacco use disorder 4.  VF arrest S/P defibrillation in the cath lab with no recurrence of arrhythmias on telemetry. 5. Essential hypertension. 6.  Anxiety and depression  Recommendation: Patient still tachycardic, although severe LV systolic dysfunction by echocardiogram, unfortunately still maintaining sinus rhythm and also blood pressure is elevated and no clinical evidence of CHF.  We will increase metoprolol succinate from 25 mg to 50 mg daily, will also add losartan in the evening 25 mg.  Q. Daily ICU today, have cardiac rehabilitation evaluate patient.   Adrian Prows, M.D. 05/05/2019, 10:57 AM [facility] Pager: [phone] Office: [phone] If no answer: [phone]

## 2019-05-06 DIAGNOSIS — I472 Ventricular tachycardia: Secondary | ICD-10-CM

## 2019-05-06 DIAGNOSIS — I10 Essential (primary) hypertension: Secondary | ICD-10-CM

## 2019-05-06 LAB — CBC
HCT: 51.2 % (ref 39.0–52.0)
Hemoglobin: 16.4 g/dL (ref 13.0–17.0)
MCH: 28.2 pg (ref 26.0–34.0)
MCHC: 32 g/dL (ref 30.0–36.0)
MCV: 88.1 fL (ref 80.0–100.0)
Platelets: 346 10*3/uL (ref 150–400)
RBC: 5.81 MIL/uL (ref 4.22–5.81)
RDW: 14.4 % (ref 11.5–15.5)
WBC: 20.6 10*3/uL — ABNORMAL HIGH (ref 4.0–10.5)
nRBC: 0 % (ref 0.0–0.2)

## 2019-05-06 LAB — POCT I-STAT, CHEM 8
BUN: 9 mg/dL (ref 6–20)
Calcium, Ion: 1.18 mmol/L (ref 1.15–1.40)
Chloride: 99 mmol/L (ref 98–111)
Creatinine, Ser: 1 mg/dL (ref 0.61–1.24)
Glucose, Bld: 162 mg/dL — ABNORMAL HIGH (ref 70–99)
HCT: 45 % (ref 39.0–52.0)
Hemoglobin: 15.3 g/dL (ref 13.0–17.0)
Potassium: 3 mmol/L — ABNORMAL LOW (ref 3.5–5.1)
Sodium: 138 mmol/L (ref 135–145)
TCO2: 27 mmol/L (ref 22–32)

## 2019-05-06 LAB — BASIC METABOLIC PANEL
Anion gap: 15 (ref 5–15)
BUN: 13 mg/dL (ref 6–20)
CO2: 24 mmol/L (ref 22–32)
Calcium: 9.4 mg/dL (ref 8.9–10.3)
Chloride: 101 mmol/L (ref 98–111)
Creatinine, Ser: 1.25 mg/dL — ABNORMAL HIGH (ref 0.61–1.24)
GFR calc Af Amer: >60 mL/min (ref >60)
GFR calc non Af Amer: >60 mL/min (ref >60)
Glucose, Bld: 170 mg/dL — ABNORMAL HIGH (ref 70–99)
Potassium: 3 mmol/L — ABNORMAL LOW (ref 3.5–5.1)
Sodium: 140 mmol/L (ref 135–145)

## 2019-05-06 LAB — POCT ACTIVATED CLOTTING TIME
Activated Clotting Time: 202 seconds
Activated Clotting Time: 274 seconds

## 2019-05-06 MED ORDER — LOSARTAN POTASSIUM 25 MG PO TABS: 25.0000 mg | ORAL_TABLET | Freq: Every evening | ORAL | 1 refills | Status: DC

## 2019-05-06 MED ORDER — ATORVASTATIN CALCIUM 80 MG PO TABS: 80.0000 mg | ORAL_TABLET | Freq: Every day | ORAL | 2 refills | Status: DC

## 2019-05-06 MED ORDER — METOPROLOL SUCCINATE ER 50 MG PO TB24: 50.0000 mg | ORAL_TABLET | Freq: Two times a day (BID) | ORAL | Status: DC

## 2019-05-06 MED ORDER — TICAGRELOR 90 MG PO TABS: 90.0000 mg | ORAL_TABLET | Freq: Two times a day (BID) | ORAL | 0 refills | Status: DC

## 2019-05-06 MED ORDER — METOPROLOL SUCCINATE ER 50 MG PO TB24: 50.0000 mg | ORAL_TABLET | Freq: Two times a day (BID) | ORAL | 1 refills | Status: DC

## 2019-05-06 MED ORDER — NICOTINE 21 MG/24HR TD PT24: 21.0000 mg | MEDICATED_PATCH | Freq: Every day | TRANSDERMAL | 0 refills | Status: DC

## 2019-05-06 MED ORDER — POTASSIUM CHLORIDE CRYS ER 20 MEQ PO TBCR
40.0000 meq | EXTENDED_RELEASE_TABLET | ORAL | Status: AC
  Administered 2019-05-06 (×2): 40 meq via ORAL
  Filled 2019-05-06 (×2): qty 2

## 2019-05-06 NOTE — Progress Notes (Signed)
Patient encouraged to stay at hospital given MI and need for lifevest at discharge.  Patient adamant about leaving as his daughter had a drug overdose.  Importance of compliance with medications stressed to patient. Post cath care reviewed with and given to patient. Patient taken to private vehicle via wheelchair.

## 2019-05-06 NOTE — Progress Notes (Signed)
Dr Einar Gip notified that patient's ride is here.  He stated he would do paper work.

## 2019-05-06 NOTE — Discharge Summary (Signed)
Physician Discharge Summary  Patient ID: Jimmy Weekes Sr. MRN: [id number] DOB/AGE: 1966-09-18 53 y.o.  Admit date: 05/05/2019 Discharge date: 05/06/2019  Primary Discharge Diagnosis 1. Acute anterolateral wall STEMI S/P primary PCI of culprit LAD with 2 overlapping stents 2. Hyperlipidemia 3. Tobacco use disorder 4. VF arrest S/P defibrillation in the cath lab with no recurrence of arrhythmias on telemetry. 5. Essential hypertension. 6. Anxiety and depression 7. NSVT on Tele last night.  Significant Diagnostic Studies: Coronary Angiography 05/05/2019:Prox LAD to Mid LAD lesion is 100% stenosed S/P aspiration thrombectomy followed by overlapping 3.5 x 20 and a 3.0 x 12 mm Synergy DES distally, stenosis reduced from 10 percent to 0%. D1 has ostial 20 to 30% stenosis. Circumflex: Mild disease in the proximal and, OM 2 is very tiny and severely diseased. RCA is dominant, proximal 50% stenosis. LV: EDP markedly elevated at 27 mmHg. EF 10 to 15% with anterolateral akinesis.  110 mL contrast utilized, 6.9 minutes of fluoroscopy time.  Recommendation: Patient will be kept in the intensive care unit, he may need a hospital admission for 48 hours or more in view of severe LV dysfunction unless he does well clinically. I discussed the findings with his wife on the telephone.   EKG 05/05/2019 at 4:48 AM: Sinus tachycardia heart rate of 102 bpm, right atrial enlargement, normal axis.  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  Anteroseptal infarct acute.  No significant change since prior EKG at 2 AM.  Echocardiogram 05/17/2019: 1. The left ventricle has severely reduced systolic function, with an ejection fraction of 25-30%. The cavity size was normal. Left ventricular diastolic function could not be evaluated. Left ventricular diffuse hypokinesis.  2. The right ventricle has normal systolic function. The cavity was normal. There is no increase in right ventricular wall thickness.  3. Trivial  pericardial effusion is present.  4. The aorta is normal in size and structure.  5. The aortic root is normal in size and structure.  Hospital Course: patient admitted with anteroseptal MI,underwent emergent coronary angiography and angioplasty to the LAD with excellent results.  Has severe LV systolic dysfunction. I had ordered LifeVest, but patient is walking out AMA.  Recommendations on discharge: patient was walking out of the hospital Man.  I have decided to discharge him.  I have communicated with the nursing staff as well.  I will make an effort to contact him tomorrow morning for an office visit.  Patient's daughter overdosed on drugs and appears that she passed away.  Discharge Exam: Blood pressure 97/71, pulse 94, temperature 98.1 F (36.7 C), temperature source Oral, resp. rate 18, height 5' 11"  (1.803 m), weight 67.6 kg, SpO2 100 %.   Physical Exam  Constitutional: He is oriented to person, place, and time. He appears well-nourished. He appears cachectic. No distress.  Appears older than stated age  HENT:  Head: Atraumatic.  Eyes: Conjunctivae are normal.  Neck: Neck supple. No JVD present. No thyromegaly present.  Cardiovascular: Normal rate, regular rhythm, intact distal pulses and normal pulses. Exam reveals distant heart sounds. Exam reveals no gallop.  No murmur heard. Intact distal pulse. No leg edema. No JVD.  Right femoral arterial access site in venous access site without any complications or hematoma.  Pulmonary/Chest: Effort normal. Tachypnea noted. He has no decreased breath sounds.  Chest barrel shaped  Abdominal: Soft. Bowel sounds are normal.  Musculoskeletal: Normal range of motion.  Neurological: He is alert and oriented to person, place, and time.  Skin: Skin is warm  and dry.  Psychiatric: His mood appears anxious.  Flat affect    Labs:   Lab Results  Component Value Date   WBC 20.6 (H) 05/06/2019   HGB 16.4 05/06/2019   HCT 51.2  05/06/2019   MCV 88.1 05/06/2019   PLT 346 05/06/2019    Recent Labs  Lab 05/05/19 0312  05/06/19 0246  NA 137  --  140  K 2.9*  --  3.0*  CL 100  --  101  CO2 24  --  24  BUN 8  --  13  CREATININE 1.10   < > 1.25*  CALCIUM 9.0  --  9.4  PROT 7.3  --   --   BILITOT 0.8  --   --   ALKPHOS 81  --   --   ALT 27  --   --   AST 34  --   --   GLUCOSE 157*  --  170*   < > = values in this interval not displayed.    Lipid Panel     Component Value Date/Time   CHOL 117 05/05/2019 0312   TRIG 141 05/05/2019 0312   HDL 33 (L) 05/05/2019 0312   CHOLHDL 3.5 05/05/2019 0312   VLDL 28 05/05/2019 0312   LDLCALC 56 05/05/2019 0312   BNP (last 3 results) No results for input(s): BNP in the last 8760 hours.  HEMOGLOBIN A1C Lab Results  Component Value Date   HGBA1C 5.4 05/05/2019   MPG 108.28 05/05/2019   Cardiac Panel (last 3 results) Troponin I (High Sensitivity) <18 ng/L >27,000High Panic    TSH No results for input(s): TSH in the last 8760 hours.  Radiology: Dg Chest Port 1v Same Day  Result Date: 05/05/2019 CLINICAL DATA:  Dyspnea on exertion. EXAM: PORTABLE CHEST 1 VIEW COMPARISON:  April 07, 2018 FINDINGS: Stable radiation changes in the right hilum. Mild atelectasis or scar in the left base. Continued elevation left hemidiaphragm. No other changes. IMPRESSION: Continued radiation change in the right hilum. Scar atelectasis in the left base. No other acute abnormalities. Electronically Signed   By: Dorise Bullion III M.D   On: 05/05/2019 18:34    FOLLOW UP PLANS AND APPOINTMENTS Discharge Instructions    AMB Referral to Cardiac Rehabilitation - Phase II   Complete by: As directed    Diagnosis:  STEMI PTCA     After initial evaluation and assessments completed: Virtual Based Care may be provided alone or in conjunction with Phase 2 Cardiac Rehab based on patient barriers.: Yes     Allergies as of 05/06/2019      Reactions   Iodinated Diagnostic Agents    It is  noted that pt is allergic to Iodinated contrast media-iv dye,oral contrast      Medication List    STOP taking these medications   budesonide 3 MG 24 hr capsule Commonly known as: ENTOCORT EC   DULoxetine 30 MG capsule Commonly known as: CYMBALTA   esomeprazole 40 MG capsule Commonly known as: NEXIUM   fentaNYL 100 MCG/HR Commonly known as: DURAGESIC   fentaNYL 25 MCG/HR Commonly known as: DURAGESIC   Fluticasone-Salmeterol 500-50 MCG/DOSE Aepb Commonly known as: ADVAIR   oxyCODONE 5 MG immediate release tablet Commonly known as: Oxy IR/ROXICODONE   pregabalin 75 MG capsule Commonly known as: LYRICA   tiotropium 18 MCG inhalation capsule Commonly known as: SPIRIVA     TAKE these medications   albuterol 108 (90 Base) MCG/ACT inhaler Commonly known as: VENTOLIN  HFA Inhale 2 puffs into the lungs every 4 (four) hours as needed for wheezing.   aspirin 81 MG chewable tablet Chew 81 mg by mouth daily.   atorvastatin 80 MG tablet Commonly known as: LIPITOR Take 1 tablet (80 mg total) by mouth daily at 6 PM. Start taking on: May 07, 2019   divalproex 250 MG DR tablet Commonly known as: DEPAKOTE Take 250-500 mg by mouth See admin instructions. Tale 250 mg in the morning and 500 mg in the evening   docusate sodium 100 MG capsule Commonly known as: COLACE Take 100 mg by mouth 2 (two) times daily.   ferrous sulfate 325 (65 FE) MG tablet Take 1 tablet (325 mg total) by mouth daily with breakfast.   gabapentin 600 MG tablet Commonly known as: NEURONTIN Take 600 mg by mouth 3 (three) times daily.   hydrOXYzine 25 MG tablet Commonly known as: ATARAX/VISTARIL Take 25 mg by mouth 3 (three) times daily as needed for anxiety.   ipratropium-albuterol 0.5-2.5 (3) MG/3ML Soln Commonly known as: DUONEB Inhale 3 mLs into the lungs as needed.   losartan 25 MG tablet Commonly known as: COZAAR Take 1 tablet (25 mg total) by mouth every evening. Start taking on: May 07, 2019   metoprolol succinate 50 MG 24 hr tablet Commonly known as: TOPROL-XL Take 1 tablet (50 mg total) by mouth 2 (two) times daily. Take with or immediately following a meal.   montelukast 10 MG tablet Commonly known as: SINGULAIR Take 10 mg by mouth daily.   multivitamin with minerals tablet Take 1 tablet by mouth daily.   nicotine 21 mg/24hr patch Commonly known as: NICODERM CQ - dosed in mg/24 hours Place 1 patch (21 mg total) onto the skin daily. Start taking on: May 07, 2019   Probiotic & Acidophilus Ex St Caps Take 1 capsule by mouth daily.   pyridOXINE 100 MG tablet Commonly known as: VITAMIN B-6 Take 100 mg by mouth daily.   Symbicort 160-4.5 MCG/ACT inhaler Generic drug: budesonide-formoterol Inhale 1 puff into the lungs 2 (two) times daily.   ticagrelor 90 MG Tabs tablet Commonly known as: BRILINTA Take 1 tablet (90 mg total) by mouth 2 (two) times daily.   traZODone 50 MG tablet Commonly known as: DESYREL Take 1 tablet (50 mg total) by mouth at bedtime. What changed:   how much to take  when to take this  reasons to take this   vitamin B-12 1000 MCG tablet Commonly known as: CYANOCOBALAMIN Take 1,000 mcg by mouth daily.      Follow-up Information    Adrian Prows, MD. Call.   Specialty: Cardiology Why: Please call to be seen in 1 week. You were recommended not to leave, but you are going against medical adivce, but I am discharging  you at your own risk. Contact information: [place] [address] [phone]          Adrian Prows, MD 05/06/2019, [place] PM  Pager: [phone] Office: [phone] If no answer: [phone]

## 2019-05-06 NOTE — Progress Notes (Signed)
Pt states his daughter had overdosed in [place] and is leaving AMA to go check on her. RN tried to educate pt on risk. Dr Einar Gip notified and stated he will discharge patient.

## 2019-05-06 NOTE — Discharge Instructions (Signed)
Femoral Site Care This sheet gives you information about how to care for yourself after your procedure. Your health care provider may also give you more specific instructions. If you have problems or questions, contact your health care provider. What can I expect after the procedure? After the procedure, it is common to have:  Bruising that usually fades within 1-2 weeks.  Tenderness at the site. Follow these instructions at home: Wound care  Follow instructions from your health care provider about how to take care of your insertion site. Make sure you: ? Wash your hands with soap and water before you change your bandage (dressing). If soap and water are not available, use hand sanitizer. ? Change your dressing as told by your health care provider. ? Leave stitches (sutures), skin glue, or adhesive strips in place. These skin closures may need to stay in place for 2 weeks or longer. If adhesive strip edges start to loosen and curl up, you may trim the loose edges. Do not remove adhesive strips completely unless your health care provider tells you to do that.  Do not take baths, swim, or use a hot tub until your health care provider approves.  You may shower 24-48 hours after the procedure or as told by your health care provider. ? Gently wash the site with plain soap and water. ? Pat the area dry with a clean towel. ? Do not rub the site. This may cause bleeding.  Do not apply powder or lotion to the site. Keep the site clean and dry.  Check your femoral site every day for signs of infection. Check for: ? Redness, swelling, or pain. ? Fluid or blood. ? Warmth. ? Pus or a bad smell. Activity  For the first 2-3 days after your procedure, or as long as directed: ? Avoid climbing stairs as much as possible. ? Do not squat.  Do not lift anything that is heavier than 10 lb (4.5 kg), or the limit that you are told, until your health care provider says that it is safe.  Rest as  directed. ? Avoid sitting for a long time without moving. Get up to take short walks every 1-2 hours.  Do not drive for 24 hours if you were given a medicine to help you relax (sedative). General instructions  Take over-the-counter and prescription medicines only as told by your health care provider.  Keep all follow-up visits as told by your health care provider. This is important. Contact a health care provider if you have:  A fever or chills.  You have redness, swelling, or pain around your insertion site. Get help right away if:  The catheter insertion area swells very fast.  You pass out.  You suddenly start to sweat or your skin gets clammy.  The catheter insertion area is bleeding, and the bleeding does not stop when you hold steady pressure on the area.  The area near or just beyond the catheter insertion site becomes pale, cool, tingly, or numb. These symptoms may represent a serious problem that is an emergency. Do not wait to see if the symptoms will go away. Get medical help right away. Call your local emergency services (911 in the [place] Do not drive yourself to the hospital. Summary  After the procedure, it is common to have bruising that usually fades within 1-2 weeks.  Check your femoral site every day for signs of infection.  Do not lift anything that is heavier than 10 lb (4.5 kg), or the  limit that you are told, until your health care provider says that it is safe. This information is not intended to replace advice given to you by your health care provider. Make sure you discuss any questions you have with your health care provider. Document Released: 05/17/2014 Document Revised: 09/26/2017 Document Reviewed: 09/26/2017 Elsevier Patient Education  2020 [place] Heart-healthy meal planning includes:  Eating less unhealthy fats.  Eating more healthy fats.  Making other changes in your diet. Talk with your doctor or a  diet specialist (dietitian) to create an eating plan that is right for you. What is my plan? Your doctor may recommend an eating plan that includes:  Total fat: ______% or less of total calories a day.  Saturated fat: ______% or less of total calories a day.  Cholesterol: less than _________mg a day. What are tips for following this plan? Cooking Avoid frying your food. Try to bake, boil, grill, or broil it instead. You can also reduce fat by:  Removing the skin from poultry.  Removing all visible fats from meats.  Steaming vegetables in water or broth. Meal planning   At meals, divide your plate into four equal parts: ? Fill one-half of your plate with vegetables and green salads. ? Fill one-fourth of your plate with whole grains. ? Fill one-fourth of your plate with lean protein foods.  Eat 4-5 servings of vegetables per day. A serving of vegetables is: ? 1 cup of raw or cooked vegetables. ? 2 cups of raw leafy greens.  Eat 4-5 servings of fruit per day. A serving of fruit is: ? 1 medium whole fruit. ?  cup of dried fruit. ?  cup of fresh, frozen, or canned fruit. ?  cup of 100% fruit juice.  Eat more foods that have soluble fiber. These are apples, broccoli, carrots, beans, peas, and barley. Try to get 20-30 g of fiber per day.  Eat 4-5 servings of nuts, legumes, and seeds per week: ? 1 serving of dried beans or legumes equals  cup after being cooked. ? 1 serving of nuts is  cup. ? 1 serving of seeds equals 1 tablespoon. General information  Eat more home-cooked food. Eat less restaurant, buffet, and fast food.  Limit or avoid alcohol.  Limit foods that are high in starch and sugar.  Avoid fried foods.  Lose weight if you are overweight.  Keep track of how much salt (sodium) you eat. This is important if you have high blood pressure. Ask your doctor to tell you more about this.  Try to add vegetarian meals each week. Fats  Choose healthy fats. These  include olive oil and canola oil, flaxseeds, walnuts, almonds, and seeds.  Eat more omega-3 fats. These include salmon, mackerel, sardines, tuna, flaxseed oil, and ground flaxseeds. Try to eat fish at least 2 times each week.  Check food labels. Avoid foods with trans fats or high amounts of saturated fat.  Limit saturated fats. ? These are often found in animal products, such as meats, butter, and cream. ? These are also found in plant foods, such as palm oil, palm kernel oil, and coconut oil.  Avoid foods with partially hydrogenated oils in them. These have trans fats. Examples are stick margarine, some tub margarines, cookies, crackers, and other baked goods. What foods can I eat? Fruits All fresh, canned (in natural juice), or frozen fruits. Vegetables Fresh or frozen vegetables (raw, steamed, roasted, or grilled). Green salads. Grains Most  grains. Choose whole wheat and whole grains most of the time. Rice and pasta, including brown rice and pastas made with whole wheat. Meats and other proteins Lean, well-trimmed beef, veal, pork, and lamb. Chicken and [place] without skin. All fish and shellfish. Wild duck, rabbit, pheasant, and venison. Egg whites or low-cholesterol egg substitutes. Dried beans, peas, lentils, and tofu. Seeds and most nuts. Dairy Low-fat or nonfat cheeses, including ricotta and mozzarella. Skim or 1% milk that is liquid, powdered, or evaporated. Buttermilk that is made with low-fat milk. Nonfat or low-fat yogurt. Fats and oils Non-hydrogenated (trans-free) margarines. Vegetable oils, including soybean, sesame, sunflower, olive, peanut, safflower, corn, canola, and cottonseed. Salad dressings or mayonnaise made with a vegetable oil. Beverages Mineral water. Coffee and tea. Diet carbonated beverages. Sweets and desserts Sherbet, gelatin, and fruit ice. Small amounts of dark chocolate. Limit all sweets and desserts. Seasonings and condiments All seasonings and  condiments. The items listed above may not be a complete list of foods and drinks you can eat. Contact a dietitian for more options. What foods should I avoid? Fruits Canned fruit in heavy syrup. Fruit in cream or butter sauce. Fried fruit. Limit coconut. Vegetables Vegetables cooked in cheese, cream, or butter sauce. Fried vegetables. Grains Breads that are made with saturated or trans fats, oils, or whole milk. Croissants. Sweet rolls. Donuts. High-fat crackers, such as cheese crackers. Meats and other proteins Fatty meats, such as hot dogs, ribs, sausage, bacon, rib-eye roast or steak. High-fat deli meats, such as salami and bologna. Caviar. Domestic duck and goose. Organ meats, such as liver. Dairy Cream, sour cream, cream cheese, and creamed cottage cheese. Whole-milk cheeses. Whole or 2% milk that is liquid, evaporated, or condensed. Whole buttermilk. Cream sauce or high-fat cheese sauce. Yogurt that is made from whole milk. Fats and oils Meat fat, or shortening. Cocoa butter, hydrogenated oils, palm oil, coconut oil, palm kernel oil. Solid fats and shortenings, including bacon fat, salt pork, lard, and butter. Nondairy cream substitutes. Salad dressings with cheese or sour cream. Beverages Regular sodas and juice drinks with added sugar. Sweets and desserts Frosting. Pudding. Cookies. Cakes. Pies. Milk chocolate or white chocolate. Buttered syrups. Full-fat ice cream or ice cream drinks. The items listed above may not be a complete list of foods and drinks to avoid. Contact a dietitian for more information. Summary  Heart-healthy meal planning includes eating less unhealthy fats, eating more healthy fats, and making other changes in your diet.  Eat a balanced diet. This includes fruits and vegetables, low-fat or nonfat dairy, lean protein, nuts and legumes, whole grains, and heart-healthy oils and fats. This information is not intended to replace advice given to you by your health  care provider. Make sure you discuss any questions you have with your health care provider. Document Released: 03/14/2012 Document Revised: 11/17/2017 Document Reviewed: 10/21/2017 Elsevier Patient Education  2020 [employer].

## 2019-05-06 NOTE — Progress Notes (Signed)
Subjective:  No specific complaints, states that he has not had any further chest pain, feels very tired.  Intake/Output from previous day:  I/O last 3 completed shifts: In: 1746.1 [P.O.:840; I.V.:906.1] Out: 1250 [Urine:1250] Total I/O In: -  Out: 300 [Urine:300]  Blood pressure 115/78, pulse 100, temperature 97.7 F (36.5 C), temperature source Oral, resp. rate (!) 22, height 5' 11"  (1.803 m), weight 67.6 kg, SpO2 100 %. Physical Exam  Constitutional: He is oriented to person, place, and time. He appears well-nourished. He appears cachectic. No distress.  Appears older than stated age  HENT:  Head: Atraumatic.  Eyes: Conjunctivae are normal.  Neck: Neck supple. No JVD present. No thyromegaly present.  Cardiovascular: Normal rate, regular rhythm, intact distal pulses and normal pulses. Exam reveals distant heart sounds. Exam reveals no gallop.  No murmur heard. Intact distal pulse. No leg edema. No JVD.  Right femoral arterial access site in venous access site without any complications or hematoma.  Pulmonary/Chest: Effort normal. Tachypnea noted. He has no decreased breath sounds.  Chest barrel shaped  Abdominal: Soft. Bowel sounds are normal.  Musculoskeletal: Normal range of motion.  Neurological: He is alert and oriented to person, place, and time.  Skin: Skin is warm and dry.  Psychiatric: His mood appears anxious.  Flat affect   Lab Results: BMP BNP (last 3 results) No results for input(s): BNP in the last 8760 hours.  ProBNP (last 3 results) No results for input(s): PROBNP in the last 8760 hours. BMP Latest Ref Rng & Units 05/06/2019 05/05/2019 05/05/2019  Glucose 70 - 99 mg/dL 170(H) - 157(H)  BUN 6 - 20 mg/dL 13 - 8  Creatinine 0.61 - 1.24 mg/dL 1.25(H) 1.14 1.10  Sodium 135 - 145 mmol/L 140 - 137  Potassium 3.5 - 5.1 mmol/L 3.0(L) - 2.9(L)  Chloride 98 - 111 mmol/L 101 - 100  CO2 22 - 32 mmol/L 24 - 24  Calcium 8.9 - 10.3 mg/dL 9.4 - 9.0   Hepatic Function  Latest Ref Rng & Units 05/05/2019  Total Protein 6.5 - 8.1 g/dL 7.3  Albumin 3.5 - 5.0 g/dL 3.4(L)  AST 15 - 41 U/L 34  ALT 0 - 44 U/L 27  Alk Phosphatase 38 - 126 U/L 81  Total Bilirubin 0.3 - 1.2 mg/dL 0.8   CBC Latest Ref Rng & Units 05/06/2019 05/05/2019 05/05/2019  WBC 4.0 - 10.5 K/uL 20.6(H) 10.9(H) -  Hemoglobin 13.0 - 17.0 g/dL 16.4 15.6 15.3  Hematocrit 39.0 - 52.0 % 51.2 47.3 45.0  Platelets 150 - 400 K/uL 346 286 -   Lipid Panel     Component Value Date/Time   CHOL 117 05/05/2019 0312   TRIG 141 05/05/2019 0312   HDL 33 (L) 05/05/2019 0312   CHOLHDL 3.5 05/05/2019 0312   VLDL 28 05/05/2019 0312   LDLCALC 56 05/05/2019 0312   Troponin I (High Sensitivity) <18 ng/L >27,000High Panic     HEMOGLOBIN A1C Lab Results  Component Value Date   HGBA1C 5.4 05/05/2019   MPG 108.28 05/05/2019   TSH No results for input(s): TSH in the last 8760 hours. Imaging: Dg Chest Port 1v Same Day  Result Date: 05/05/2019 CLINICAL DATA:  Dyspnea on exertion. EXAM: PORTABLE CHEST 1 VIEW COMPARISON:  April 07, 2018 FINDINGS: Stable radiation changes in the right hilum. Mild atelectasis or scar in the left base. Continued elevation left hemidiaphragm. No other changes. IMPRESSION: Continued radiation change in the right hilum. Scar atelectasis in the left base. No  other acute abnormalities. Electronically Signed   By: Dorise Bullion III M.D   On: 05/05/2019 18:34    Cardiac Studies:  Coronary Angiography 05/05/2019: Prox LAD to Mid LAD lesion is 100% stenosed S/P aspiration thrombectomy followed by overlapping 3.5 x 20 and a 3.0 x 12 mm Synergy DES distally, stenosis reduced from 10 percent to 0%.  D1 has ostial 20 to 30% stenosis. Circumflex: Mild disease in the proximal and, OM 2 is very tiny and severely diseased. RCA is dominant, proximal 50% stenosis. LV: EDP markedly elevated at 27 mmHg.  EF 10 to 15% with anterolateral akinesis.  110 mL contrast utilized, 6.9 minutes of fluoroscopy  time.  Recommendation: Patient will be kept in the intensive care unit, he may need a hospital admission for 48 hours or more in view of severe LV dysfunction unless he does well clinically.  I discussed the findings with his wife on the telephone.   EKG 05/05/2019 at 4:48 AM: Sinus tachycardia heart rate of 102 bpm, right atrial enlargement, normal axis.  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  Anteroseptal infarct acute.  No significant change since prior EKG at 2 AM.  Echocardiogram 05/17/2019: 1. The left ventricle has severely reduced systolic function, with an ejection fraction of 25-30%. The cavity size was normal. Left ventricular diastolic function could not be evaluated. Left ventricular diffuse hypokinesis.  2. The right ventricle has normal systolic function. The cavity was normal. There is no increase in right ventricular wall thickness.  3. Trivial pericardial effusion is present.  4. The aorta is normal in size and structure.  5. The aortic root is normal in size and structure.  Scheduled Meds: . aspirin  81 mg Oral Daily  . atorvastatin  80 mg Oral q1800  . Chlorhexidine Gluconate Cloth  6 each Topical Daily  . heparin  5,000 Units Subcutaneous Q8H  . losartan  25 mg Oral QPM  . metoprolol succinate  50 mg Oral Daily  . nicotine  21 mg Transdermal Daily  . sodium chloride flush  3 mL Intravenous Q12H  . ticagrelor  90 mg Oral BID   Continuous Infusions: . sodium chloride 10 mL/hr at 05/05/19 1515  . sodium chloride     PRN Meds:.sodium chloride, acetaminophen, diazepam, ondansetron (ZOFRAN) IV, sodium chloride flush  Assessment/Plan:  1.  Acute anterolateral wall STEMI S/P primary PCI of culprit LAD with 2 overlapping stents 2.  Hyperlipidemia 3.  Tobacco use disorder 4.  VF arrest S/P defibrillation in the cath lab with no recurrence of arrhythmias on telemetry. 5. Essential hypertension. 6.  Anxiety and depression 7.NSVT on Tele last night  Recommendation: Patient still  tachycardic, BP is now well controlled, increase Metoprolol succinate to 50 mg BID. CXR reviewed. On appropriate therapy and less anxious on Nicotine patch. Transfer to tele. Will get Lifevest ordered.  Hope to discharge home tomorrow or Tuesday.  Adrian Prows, M.D. 05/06/2019, 10:20 AM [facility] Pager: [phone] Office: [phone] If no answer: [phone]

## 2019-05-07 ENCOUNTER — Encounter (HOSPITAL_COMMUNITY): Payer: Self-pay | Admitting: Cardiology

## 2019-05-07 ENCOUNTER — Telehealth: Payer: Self-pay | Admitting: *Deleted

## 2019-05-07 DIAGNOSIS — I213 ST elevation (STEMI) myocardial infarction of unspecified site: Secondary | ICD-10-CM | POA: Diagnosis not present

## 2019-05-07 MED FILL — Heparin Sod (Porcine)-NaCl IV Soln 1000 Unit/500ML-0.9%: INTRAVENOUS | Qty: 500 | Status: AC

## 2019-05-07 NOTE — Care Management (Signed)
CM received call from [facility] liaison - liaison received referral during weekend for pt that discharged home on 05/06/19.  Liason to contact clinic that submitted referral.

## 2019-05-08 ENCOUNTER — Telehealth: Payer: Self-pay

## 2019-05-08 NOTE — Telephone Encounter (Signed)
Location of hospitalization: [place] Reason for hospitalization: STEMI Date of discharge: 05/06/19 Date of first communication with patient: today Person contacting patient: Me Current symptoms: NA Do you understand why you were in the Hospital: Yes Questions regarding discharge instructions: None Where were you discharged to: Home Medications reviewed: Yes Allergies reviewed: Yes Dietary changes reviewed: Yes. Discussed low fat and low salt diet.  Referals reviewed: NA Activities of Daily Living: Able to with mild limitations Any transportation issues/concerns: None Any patient concerns: None Confirmed importance & date/time of Follow up appt: Yes Confirmed with patient if condition begins to worsen call. Pt was given the office number and encouraged to call back with questions or concerns: Yes

## 2019-05-10 ENCOUNTER — Encounter: Payer: Self-pay | Admitting: Cardiology

## 2019-05-11 ENCOUNTER — Other Ambulatory Visit: Payer: Self-pay

## 2019-05-11 ENCOUNTER — Encounter: Payer: Self-pay | Admitting: Cardiology

## 2019-05-11 ENCOUNTER — Ambulatory Visit (INDEPENDENT_AMBULATORY_CARE_PROVIDER_SITE_OTHER): Payer: Medicare Other | Admitting: Cardiology

## 2019-05-11 VITALS — BP 105/73 | HR 112 | Ht 71.5 in | Wt 155.9 lb

## 2019-05-11 DIAGNOSIS — J410 Simple chronic bronchitis: Secondary | ICD-10-CM

## 2019-05-11 DIAGNOSIS — I255 Ischemic cardiomyopathy: Secondary | ICD-10-CM | POA: Diagnosis not present

## 2019-05-11 DIAGNOSIS — I10 Essential (primary) hypertension: Secondary | ICD-10-CM | POA: Diagnosis not present

## 2019-05-11 DIAGNOSIS — I2510 Atherosclerotic heart disease of native coronary artery without angina pectoris: Secondary | ICD-10-CM | POA: Diagnosis not present

## 2019-05-11 DIAGNOSIS — I5043 Acute on chronic combined systolic (congestive) and diastolic (congestive) heart failure: Secondary | ICD-10-CM | POA: Diagnosis not present

## 2019-05-11 DIAGNOSIS — I252 Old myocardial infarction: Secondary | ICD-10-CM | POA: Diagnosis not present

## 2019-05-11 MED ORDER — TICAGRELOR 90 MG PO TABS: 90.0000 mg | ORAL_TABLET | Freq: Two times a day (BID) | ORAL | 6 refills | Status: DC

## 2019-05-11 MED ORDER — METOPROLOL SUCCINATE ER 100 MG PO TB24: 100.0000 mg | ORAL_TABLET | Freq: Two times a day (BID) | ORAL | 1 refills | Status: DC

## 2019-05-11 NOTE — Progress Notes (Signed)
Primary Physician/Referring:  Maryella Shivers, MD  Patient ID: Jimmy Mort., male    DOB: 21-May-1966, 53 y.o.   MRN: [id number]  Chief Complaint  Patient presents with  . New Patient (Initial Visit)  . Hospitalization Follow-up    LHC   HPI:    HPI: Jimmy Nelson Sr.  is a 53 y.o. Caucasian male with lung cancer in remission, ongoing tobacco use disorder, COPD, hypertension, admitted with acute pulmonary and underwent successful thrombectomy followed by stenting to proximal and mid LAD on 05/05/2019.  He has severe LV systolic dysfunction with EF 25 to 30% and had NSVT on telemetry.  He was discharged 2 days later walking out AMA due to his daughter having had overdosed on narcotics, at that point I had discontinued his AMA and discharged him voluntarily and advised him to follow-up with me in the outpatient basis within 1 week to 10 days after hospitalization.  He now presents with follow-up of the same.  States that he has been compliant with taking his medications, has not had any further chest pain except sharp pain lasting a few seconds a couple times different from angina and has not used sublingual nitroglycerin. Wife present on telephone during entire length of his visit. No leg edema, wearing LifeVest, no change in cough or dyspnea. He is wondering if he can get orders for small O2 tank or O2 concentrator.   Past Medical History:  Diagnosis Date  . Amnesia   . Anemia   . COPD (chronic obstructive pulmonary disease) ([place])   . Crohn's disease ([place]) Deteriorating disk  . Depression   . History of MI (myocardial infarction) 05/05/2019  . History of stroke    2  . Hyperlipidemia   . Hypertension   . Lung cancer ([place])   . Mental disorder   . Sleep apnea   . Stroke [facility])    Past Surgical History:  Procedure Laterality Date  . APPENDECTOMY    . CORONARY/GRAFT ACUTE MI REVASCULARIZATION N/A 05/05/2019   Procedure: Coronary/Graft Acute MI Revascularization;   Surgeon: Adrian Prows, MD;  Location: [place] [facility];  Service: Cardiovascular;  Laterality: N/A;  . EYE SURGERY    . intestestine for chrohns disease    . LEFT HEART CATH AND CORONARY ANGIOGRAPHY N/A 05/05/2019   Procedure: LEFT HEART CATH AND CORONARY ANGIOGRAPHY;  Surgeon: Adrian Prows, MD;  Location: [place] [facility];  Service: Cardiovascular;  Laterality: N/A;  . PORT-A-CATH REMOVAL     Social History   Socioeconomic History  . Marital status: Married    Spouse name: Not on file  . Number of children: 8  . Years of education: Not on file  . Highest education level: Not on file  Occupational History  . Not on file  Social Needs  . Financial resource strain: Not on file  . Food insecurity    Worry: Not on file    Inability: Not on file  . Transportation needs    Medical: Not on file    Non-medical: Not on file  Tobacco Use  . Smoking status: Current Every Day Smoker    Packs/day: 0.25    Types: Cigarettes  . Smokeless tobacco: Current User    Types: Snuff  Substance and Sexual Activity  . Alcohol use: Not Currently  . Drug use: No  . Sexual activity: Yes  Lifestyle  . Physical activity    Days per week: Not on file    Minutes per session:  Not on file  . Stress: Not on file  Relationships  . Social Herbalist on phone: Not on file    Gets together: Not on file    Attends religious service: Not on file    Active member of club or organization: Not on file    Attends meetings of clubs or organizations: Not on file    Relationship status: Not on file  . Intimate partner violence    Fear of current or ex partner: Not on file    Emotionally abused: Not on file    Physically abused: Not on file    Forced sexual activity: Not on file  Other Topics Concern  . Not on file  Social History Narrative  . Not on file   ROS  Review of Systems  Constitution: Negative for chills, decreased appetite, malaise/fatigue and weight gain.  Cardiovascular: Negative for  chest pain, dyspnea on exertion, leg swelling and syncope.  Respiratory: Positive for shortness of breath.   Endocrine: Negative for cold intolerance.  Hematologic/Lymphatic: Does not bruise/bleed easily.  Musculoskeletal: Negative for joint swelling.  Gastrointestinal: Negative for abdominal pain, anorexia, change in bowel habit, hematochezia and melena.  Neurological: Negative for headaches and light-headedness.  Psychiatric/Behavioral: Positive for depression. Negative for substance abuse. The patient is nervous/anxious.   All other systems reviewed and are negative.  Objective  Blood pressure 105/73, pulse (!) 112, height 5' 11.5" (1.816 m), weight 155 lb 14.4 oz (70.7 kg), SpO2 98 %. Body mass index is 21.44 kg/m.   Physical Exam  Constitutional: He is oriented to person, place, and time. He appears well-nourished. He appears cachectic. No distress.  Appears older than stated age  HENT:  Head: Atraumatic.  Eyes: Conjunctivae are normal.  Neck: Neck supple. No JVD present. No thyromegaly present.  Cardiovascular: Normal rate, regular rhythm, intact distal pulses and normal pulses. Exam reveals distant heart sounds. Exam reveals no gallop.  No murmur heard. Intact distal pulse. No leg edema. No JVD.  Right femoral arterial access site in venous access site without any complications or hematoma.  Pulmonary/Chest: Effort normal. Tachypnea noted. He has no decreased breath sounds.  Chest barrel shaped  Abdominal: Soft. Bowel sounds are normal.  Musculoskeletal: Normal range of motion.  Neurological: He is alert and oriented to person, place, and time.  Skin: Skin is warm and dry.  Psychiatric: His mood appears anxious.  Flat affect   Radiology: No results found.  Laboratory examination:   Recent Labs    05/05/19 0311 05/05/19 0312 05/05/19 0541 05/06/19 0246  NA 138 137  --  140  K 3.0* 2.9*  --  3.0*  CL 99 100  --  101  CO2  --  24  --  24  GLUCOSE 162* 157*  --   170*  BUN 9 8  --  13  CREATININE 1.00 1.10 1.14 1.25*  CALCIUM  --  9.0  --  9.4  GFRNONAA  --  >60 >60 >60  GFRAA  --  >60 >60 >60   CMP Latest Ref Rng & Units 05/06/2019 05/05/2019 05/05/2019  Glucose 70 - 99 mg/dL 170(H) - 157(H)  BUN 6 - 20 mg/dL 13 - 8  Creatinine 0.61 - 1.24 mg/dL 1.25(H) 1.14 1.10  Sodium 135 - 145 mmol/L 140 - 137  Potassium 3.5 - 5.1 mmol/L 3.0(L) - 2.9(L)  Chloride 98 - 111 mmol/L 101 - 100  CO2 22 - 32 mmol/L 24 - 24  Calcium  8.9 - 10.3 mg/dL 9.4 - 9.0  Total Protein 6.5 - 8.1 g/dL - - 7.3  Total Bilirubin 0.3 - 1.2 mg/dL - - 0.8  Alkaline Phos 38 - 126 U/L - - 81  AST 15 - 41 U/L - - 34  ALT 0 - 44 U/L - - 27   CBC Latest Ref Rng & Units 05/06/2019 05/05/2019 05/05/2019  WBC 4.0 - 10.5 K/uL 20.6(H) 10.9(H) -  Hemoglobin 13.0 - 17.0 g/dL 16.4 15.6 15.3  Hematocrit 39.0 - 52.0 % 51.2 47.3 45.0  Platelets 150 - 400 K/uL 346 286 -   Lipid Panel     Component Value Date/Time   CHOL 117 05/05/2019 0312   TRIG 141 05/05/2019 0312   HDL 33 (L) 05/05/2019 0312   CHOLHDL 3.5 05/05/2019 0312   VLDL 28 05/05/2019 0312   LDLCALC 56 05/05/2019 0312   HEMOGLOBIN A1C Lab Results  Component Value Date   HGBA1C 5.4 05/05/2019   MPG 108.28 05/05/2019   TSH No results for input(s): TSH in the last 8760 hours. Medications   Current Outpatient Medications  Medication Instructions  . albuterol (PROVENTIL HFA;VENTOLIN HFA) 108 (90 BASE) MCG/ACT inhaler 2 puffs, Inhalation, Every 4 hours PRN  . aspirin 81 mg, Oral, Daily  . atorvastatin (LIPITOR) 80 mg, Oral, Daily-1800  . divalproex (DEPAKOTE) 250-500 mg, Oral, See admin instructions, Tale 250 mg in the morning and 500 mg in the evening  . docusate sodium (COLACE) 100 mg, Oral, 2 times daily  . ferrous sulfate 325 mg, Oral, Daily with breakfast  . gabapentin (NEURONTIN) 600 mg, Oral, 3 times daily  . ipratropium-albuterol (DUONEB) 0.5-2.5 (3) MG/3ML SOLN 3 mLs, Inhalation, As needed  . losartan (COZAAR) 25 mg,  Oral, Every evening  . metoprolol succinate (TOPROL-XL) 100 mg, Oral, 2 times daily, Take with or immediately following a meal.  . montelukast (SINGULAIR) 10 mg, Oral, Daily  . Multiple Vitamins-Minerals (MULTIVITAMIN WITH MINERALS) tablet 1 tablet, Oral, Daily  . nicotine (NICODERM CQ - DOSED IN MG/24 HOURS) 21 mg, Transdermal, Daily  . pyridOXINE (VITAMIN B-6) 100 mg, Oral, Daily  . SYMBICORT 160-4.5 MCG/ACT inhaler 1 puff, Inhalation, 2 times daily  . ticagrelor (BRILINTA) 90 mg, Oral, 2 times daily  . traZODone (DESYREL) 50 mg, Oral, Daily at bedtime  . vitamin B-12 (CYANOCOBALAMIN) 1,000 mcg, Oral, Daily    Cardiac Studies:   Coronary Angiography 05/05/2019:Prox LAD to Mid LAD lesion is 100% stenosed S/P aspiration thrombectomy followed by overlapping 3.5 x 20 and a 3.0 x 12 mm Synergy DES distally, stenosis reduced from 10 percent to 0%. D1 has ostial 20 to 30% stenosis. Circumflex: Mild disease in the proximal and, OM 2 is very tiny and severely diseased. RCA is dominant, proximal 50% stenosis. LV: EDP markedly elevated at 27 mmHg. EF 10 to 15% with anterolateral akinesis. 110 mL contrast utilized, 6.9 minutes of fluoroscopy time. Recommendation: Patient will be kept in the intensive care unit, he may need a hospital admission for 48 hours or more in view of severe LV dysfunction unless he does well clinically. I discussed the findings with his wife on the telephone.  Echocardiogram 05/17/2019: 1. The left ventricle has severely reduced systolic function, with an ejection fraction of 25-30%. The cavity size was normal. Left ventricular diastolic function could not be evaluated. Left ventricular diffuse hypokinesis. 2. The right ventricle has normal systolic function. The cavity was normal. There is no increase in right ventricular wall thickness. 3. Trivial pericardial effusion is  present. 4. The aorta is normal in size and structure. 5. The aortic root is normal in size  and structure.  Assessment     ICD-10-CM   1. Coronary artery disease involving native coronary artery of native heart without angina pectoris  I25.10 EKG 12-Lead    metoprolol succinate (TOPROL-XL) 100 MG 24 hr tablet   Proximal to mid LAD thrombectomy and 3.5 x 20, 3.0 x 12 mm on 05/05/2019.  2. Ischemic cardiomyopathy  I25.5 EKG 12-Lead    metoprolol succinate (TOPROL-XL) 100 MG 24 hr tablet  3. Acute on chronic combined systolic and diastolic CHF (congestive heart failure) (HCC)  I50.43 EKG 12-Lead  4. H/O acute anterior myocardial infarction 05/05/19  I25.2   5. Essential hypertension  I10   6. Simple chronic bronchitis (HCC)  J41.0     EKG 05/11/2019: Sinus tachycardia at rate of 105 bpm, normal axis, anteroseptal infarct old.  Persistent ST elevation in V1 to V4, may suggest aneurysm formation.  Normal QT interval.   EKG 05/05/2019 at 4:48 AM: Sinus tachycardia heart rate of 102 bpm, right atrial enlargement, normal axis. 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 Anteroseptal infarct acute. No significant change since prior EKG at 2 AM.  Recommendations:   Patient one week TOC visit for acute myocardial infarction.  He is presently doing well and does changed his lifestyle and finally appears to be motivated for smoking cessation. He has now reduced to 1/2 packs a day and is wearing nicotine patch.  He still continues to remain tachycardic, blood pressure is borderline soft, I will increase metoprolol succinate from 50 mg b.i.d. to 100 mg p.o. b.i.d.  I would like to optimize his medical therapy, he may be a good candidate for Entresto but his blood pressure being soft and also due to NSVT, I would like to titrate his beta blocker 1st.  He is presently wearing LifeVest and advised him to continue to the same.  He will eventually need recheck on his echo in 2-3 months after optimizing his medical therapy.  I may consider addition of Corlanor if his heart rate continues to be elevated.  With regard to  COPD this is being managed by his PCP.  I'll see him back in 4 weeks.  Fortunately he has remained compliant with all the medications.  I also spoke to his wife over the telephone.  Advised him not to drive until cleared by me as he had 2 episodes of VF arrest during the cardiac catheterization and also had NSVT during telemetry monitoring. He is wondering if he can get orders for small O2 tank or O2 concentrator, advised him to discuss with his PCP.   Adrian Prows, MD, [facility] 05/11/2019, 3:43 PM [facility] [place] Pager: [phone] Office: [phone] If no answer Cell [phone]

## 2019-05-12 ENCOUNTER — Encounter: Payer: Self-pay | Admitting: Cardiology

## 2019-05-23 ENCOUNTER — Other Ambulatory Visit: Payer: Self-pay

## 2019-05-23 ENCOUNTER — Encounter (HOSPITAL_COMMUNITY): Payer: Self-pay | Admitting: *Deleted

## 2019-05-23 ENCOUNTER — Emergency Department (HOSPITAL_COMMUNITY)
Admission: EM | Admit: 2019-05-23 | Discharge: 2019-05-23 | Discharge status: Against Medical Advice | Payer: Medicare Other | Attending: Emergency Medicine | Admitting: Emergency Medicine

## 2019-05-23 DIAGNOSIS — R079 Chest pain, unspecified: Secondary | ICD-10-CM | POA: Diagnosis not present

## 2019-05-23 DIAGNOSIS — Z5321 Procedure and treatment not carried out due to patient leaving prior to being seen by health care provider: Secondary | ICD-10-CM | POA: Insufficient documentation

## 2019-05-23 DIAGNOSIS — R55 Syncope and collapse: Secondary | ICD-10-CM | POA: Diagnosis not present

## 2019-05-23 LAB — TROPONIN I (HIGH SENSITIVITY): Troponin I (High Sensitivity): 97 ng/L — ABNORMAL HIGH (ref <18)

## 2019-05-23 NOTE — ED Triage Notes (Addendum)
Pt was getting worked up because his wife and his girlfriend were arguing when he possibly lost consciousness.  His wife did compressions, but he remembers them.  VS stable.  Pt took 324 asa before ems arrival.  Pt states he has a defibrillator/life vest on and it did not go off.

## 2019-05-23 NOTE — ED Notes (Addendum)
PT refused to stay after coming in by EMS and states he is only here because he got upset with his wife and girlfriend.

## 2019-05-24 ENCOUNTER — Ambulatory Visit: Payer: Medicare Other | Admitting: Cardiology

## 2019-05-25 ENCOUNTER — Emergency Department (HOSPITAL_COMMUNITY): Payer: Medicare Other

## 2019-05-25 ENCOUNTER — Emergency Department (HOSPITAL_COMMUNITY)
Admission: EM | Admit: 2019-05-25 | Discharge: 2019-05-25 | Disposition: A | Discharge status: Home / Self Care | Payer: Medicare Other | Attending: Emergency Medicine | Admitting: Emergency Medicine

## 2019-05-25 ENCOUNTER — Other Ambulatory Visit: Payer: Self-pay

## 2019-05-25 ENCOUNTER — Encounter (HOSPITAL_COMMUNITY): Payer: Self-pay | Admitting: Emergency Medicine

## 2019-05-25 DIAGNOSIS — I252 Old myocardial infarction: Secondary | ICD-10-CM | POA: Diagnosis not present

## 2019-05-25 DIAGNOSIS — Z79899 Other long term (current) drug therapy: Secondary | ICD-10-CM | POA: Diagnosis not present

## 2019-05-25 DIAGNOSIS — Z91041 Radiographic dye allergy status: Secondary | ICD-10-CM | POA: Diagnosis not present

## 2019-05-25 DIAGNOSIS — R0602 Shortness of breath: Secondary | ICD-10-CM | POA: Diagnosis not present

## 2019-05-25 DIAGNOSIS — F17210 Nicotine dependence, cigarettes, uncomplicated: Secondary | ICD-10-CM | POA: Diagnosis not present

## 2019-05-25 DIAGNOSIS — J449 Chronic obstructive pulmonary disease, unspecified: Secondary | ICD-10-CM | POA: Insufficient documentation

## 2019-05-25 DIAGNOSIS — I10 Essential (primary) hypertension: Secondary | ICD-10-CM | POA: Diagnosis not present

## 2019-05-25 DIAGNOSIS — E785 Hyperlipidemia, unspecified: Secondary | ICD-10-CM | POA: Diagnosis not present

## 2019-05-25 DIAGNOSIS — R079 Chest pain, unspecified: Secondary | ICD-10-CM | POA: Diagnosis not present

## 2019-05-25 DIAGNOSIS — Z7982 Long term (current) use of aspirin: Secondary | ICD-10-CM | POA: Insufficient documentation

## 2019-05-25 DIAGNOSIS — I2510 Atherosclerotic heart disease of native coronary artery without angina pectoris: Secondary | ICD-10-CM | POA: Diagnosis not present

## 2019-05-25 DIAGNOSIS — I959 Hypotension, unspecified: Secondary | ICD-10-CM | POA: Diagnosis not present

## 2019-05-25 LAB — CBC
HCT: 42.8 % (ref 39.0–52.0)
Hemoglobin: 13.5 g/dL (ref 13.0–17.0)
MCH: 28.2 pg (ref 26.0–34.0)
MCHC: 31.5 g/dL (ref 30.0–36.0)
MCV: 89.4 fL (ref 80.0–100.0)
Platelets: 276 10*3/uL (ref 150–400)
RBC: 4.79 MIL/uL (ref 4.22–5.81)
RDW: 13.8 % (ref 11.5–15.5)
WBC: 12.3 10*3/uL — ABNORMAL HIGH (ref 4.0–10.5)
nRBC: 0 % (ref 0.0–0.2)

## 2019-05-25 LAB — COMPREHENSIVE METABOLIC PANEL
ALT: 24 U/L (ref 0–44)
AST: 21 U/L (ref 15–41)
Albumin: 2.6 g/dL — ABNORMAL LOW (ref 3.5–5.0)
Alkaline Phosphatase: 61 U/L (ref 38–126)
Anion gap: 8 (ref 5–15)
BUN: 10 mg/dL (ref 6–20)
CO2: 25 mmol/L (ref 22–32)
Calcium: 8.5 mg/dL — ABNORMAL LOW (ref 8.9–10.3)
Chloride: 106 mmol/L (ref 98–111)
Creatinine, Ser: 1.58 mg/dL — ABNORMAL HIGH (ref 0.61–1.24)
GFR calc Af Amer: 57 mL/min — ABNORMAL LOW (ref >60)
GFR calc non Af Amer: 49 mL/min — ABNORMAL LOW (ref >60)
Glucose, Bld: 93 mg/dL (ref 70–99)
Potassium: 3.1 mmol/L — ABNORMAL LOW (ref 3.5–5.1)
Sodium: 139 mmol/L (ref 135–145)
Total Bilirubin: 0.5 mg/dL (ref 0.3–1.2)
Total Protein: 6.2 g/dL — ABNORMAL LOW (ref 6.5–8.1)

## 2019-05-25 LAB — TROPONIN I (HIGH SENSITIVITY)
Troponin I (High Sensitivity): 102 ng/L (ref <18)
Troponin I (High Sensitivity): 90 ng/L — ABNORMAL HIGH (ref <18)

## 2019-05-25 LAB — BRAIN NATRIURETIC PEPTIDE: B Natriuretic Peptide: 842.3 pg/mL — ABNORMAL HIGH (ref 0.0–100.0)

## 2019-05-25 MED ORDER — PREDNISONE 20 MG PO TABS: 20.0000 mg | ORAL_TABLET | Freq: Every day | ORAL | 0 refills | Status: DC

## 2019-05-25 MED ORDER — HYDROXYZINE HCL 25 MG PO TABS: 25.0000 mg | ORAL_TABLET | Freq: Three times a day (TID) | ORAL | 0 refills | Status: DC | PRN

## 2019-05-25 NOTE — ED Notes (Signed)
Pt ambulated well in hallway. Pt O2 sats stayed 92% on home O2. Pt stated he did not feel SOB

## 2019-05-25 NOTE — ED Provider Notes (Signed)
[place] EMERGENCY DEPARTMENT Provider Note   CSN: [id number] Arrival date & time: 05/25/19  4431     History   Chief Complaint Chief Complaint  Patient presents with  . Shortness of Breath    HPI Jimmy Ligman Sr. is a 53 y.o. male.     HPI Patient presents with shortness of breath.  States has had some last few days but worse around 9:00 this morning.Worse with laying flat.  States that EMS told him that if he sat up it would be better.  History of COPD and coronary artery disease.  Is wearing a LifeVest due to a decreased ejection fraction.  No swelling in his legs.  No real cough.  Is on chronic oxygen at home.  States he is feeling somewhat better now.  No chest pain. Past Medical History:  Diagnosis Date  . Amnesia   . Anemia   . COPD (chronic obstructive pulmonary disease) ([place])   . Crohn's disease ([place]) Deteriorating disk  . Depression   . History of MI (myocardial infarction) 05/05/2019  . History of stroke    2  . Hyperlipidemia   . Hypertension   . Lung cancer ([place])   . Mental disorder   . Sleep apnea   . Stroke [facility])     Patient Active Problem List   Diagnosis Date Noted  . Acute MI, anterolateral wall, initial episode of care ([place]) 05/05/2019  . Tobacco use disorder, continuous 05/05/2019  . Acute anterolateral myocardial infarction ([place]) 05/05/2019  . COPD (chronic obstructive pulmonary disease) ([place])   . Esophageal reflux 01/20/2013  . Alcohol abuse 12/30/2012  . Depressive disorder, not elsewhere classified 12/30/2012  . Crohn's disease ([place]) 08/11/2008    Past Surgical History:  Procedure Laterality Date  . APPENDECTOMY    . CORONARY/GRAFT ACUTE MI REVASCULARIZATION N/A 05/05/2019   Procedure: Coronary/Graft Acute MI Revascularization;  Surgeon: Adrian Prows, MD;  Location: [place] [facility];  Service: Cardiovascular;  Laterality: N/A;  . EYE SURGERY    . intestestine for chrohns disease    . LEFT HEART CATH AND  CORONARY ANGIOGRAPHY N/A 05/05/2019   Procedure: LEFT HEART CATH AND CORONARY ANGIOGRAPHY;  Surgeon: Adrian Prows, MD;  Location: [place] [facility];  Service: Cardiovascular;  Laterality: N/A;  . PORT-A-CATH REMOVAL          Home Medications    Prior to Admission medications   Medication Sig Start Date End Date Taking? Authorizing Provider  albuterol (PROVENTIL HFA;VENTOLIN HFA) 108 (90 BASE) MCG/ACT inhaler Inhale 2 puffs into the lungs every 4 (four) hours as needed for wheezing.    [provider]  aspirin 81 MG chewable tablet Chew 81 mg by mouth daily.    [provider]  atorvastatin (LIPITOR) 80 MG tablet Take 1 tablet (80 mg total) by mouth daily at 6 PM. 05/07/19   Adrian Prows, MD  divalproex (DEPAKOTE) 250 MG DR tablet Take 250-500 mg by mouth See admin instructions. Tale 250 mg in the morning and 500 mg in the evening 05/02/19   [provider]  docusate sodium (COLACE) 100 MG capsule Take 100 mg by mouth 2 (two) times daily.    [provider]  ferrous sulfate 325 (65 FE) MG tablet Take 1 tablet (325 mg total) by mouth daily with breakfast. 01/01/13   Patrecia Pour, NP  gabapentin (NEURONTIN) 600 MG tablet Take 600 mg by mouth 3 (three) times daily. 05/02/19   [provider]  hydrOXYzine (ATARAX/VISTARIL) 25 MG tablet Take 1 tablet (25 mg total) by mouth every 8 (eight) hours as needed for anxiety. 05/25/19   Davonna Belling, MD  ipratropium-albuterol (DUONEB) 0.5-2.5 (3) MG/3ML SOLN Inhale 3 mLs into the lungs as needed. 05/02/19   [provider]  losartan (COZAAR) 25 MG tablet Take 1 tablet (25 mg total) by mouth every evening. 05/07/19   Adrian Prows, MD  metoprolol succinate (TOPROL-XL) 100 MG 24 hr tablet Take 1 tablet (100 mg total) by mouth 2 (two) times daily. Take with or immediately following a meal. 05/11/19   Adrian Prows, MD  montelukast (SINGULAIR) 10 MG tablet Take 10 mg by mouth daily. 05/02/19   [provider]   Multiple Vitamins-Minerals (MULTIVITAMIN WITH MINERALS) tablet Take 1 tablet by mouth daily. 01/01/13   Patrecia Pour, NP  nicotine (NICODERM CQ - DOSED IN MG/24 HOURS) 21 mg/24hr patch Place 1 patch (21 mg total) onto the skin daily. 05/07/19   Adrian Prows, MD  predniSONE (DELTASONE) 20 MG tablet Take 1 tablet (20 mg total) by mouth daily. 05/25/19   Davonna Belling, MD  pyridOXINE (VITAMIN B-6) 100 MG tablet Take 100 mg by mouth daily.    [provider]  SYMBICORT 160-4.5 MCG/ACT inhaler Inhale 1 puff into the lungs 2 (two) times daily. 05/02/19   [provider]  ticagrelor (BRILINTA) 90 MG TABS tablet Take 1 tablet (90 mg total) by mouth 2 (two) times daily. 05/11/19   Adrian Prows, MD  traZODone (DESYREL) 50 MG tablet Take 1 tablet (50 mg total) by mouth at bedtime. Patient taking differently: Take 100 mg by mouth at bedtime as needed for sleep.  01/01/13   Patrecia Pour, NP  vitamin B-12 (CYANOCOBALAMIN) 1000 MCG tablet Take 1,000 mcg by mouth daily.    [provider]    Family History No family history on file.  Social History Social History   Tobacco Use  . Smoking status: Current Every Day Smoker    Packs/day: 0.25    Types: Cigarettes  . Smokeless tobacco: Current User    Types: Snuff  Substance Use Topics  . Alcohol use: Not Currently  . Drug use: No     Allergies   Iodinated diagnostic agents   Review of Systems Review of Systems  Constitutional: Negative for appetite change.  HENT: Negative for congestion.   Respiratory: Positive for shortness of breath. Negative for cough.   Cardiovascular: Negative for chest pain.  Gastrointestinal: Negative for abdominal pain.  Genitourinary: Negative for flank pain.  Musculoskeletal: Negative for back pain.  Skin: Negative for rash.  Neurological: Negative for weakness.  Psychiatric/Behavioral: Negative for confusion.     Physical Exam Updated Vital Signs BP 97/82   Pulse 92   Temp 97.8 F  (36.6 C) (Oral)   Resp 19   Ht 5' 11"  (1.803 m)   Wt 70.7 kg   SpO2 96%   BMI 21.74 kg/m   Physical Exam Vitals signs and nursing note reviewed.  HENT:     Head: Normocephalic.  Neck:     Musculoskeletal: Neck supple.     Vascular: No JVD.  Cardiovascular:     Rate and Rhythm: Normal rate and regular rhythm.  Pulmonary:     Comments: Mildly harsh breath sounds.  No frank rales.  No frank wheezes.  Patient is wearing a LifeVest Chest:     Chest wall: No tenderness.  Musculoskeletal:     Right lower leg: No edema.  Left lower leg: No edema.  Skin:    General: Skin is warm.     Capillary Refill: Capillary refill takes less than 2 seconds.  Neurological:     Mental Status: He is alert.      ED Treatments / Results  Labs (all labs ordered are listed, but only abnormal results are displayed) Labs Reviewed  BRAIN NATRIURETIC PEPTIDE - Abnormal; Notable for the following components:      Result Value   B Natriuretic Peptide 842.3 (*)    All other components within normal limits  COMPREHENSIVE METABOLIC PANEL - Abnormal; Notable for the following components:   Potassium 3.1 (*)    Creatinine, Ser 1.58 (*)    Calcium 8.5 (*)    Total Protein 6.2 (*)    Albumin 2.6 (*)    GFR calc non Af Amer 49 (*)    GFR calc Af Amer 57 (*)    All other components within normal limits  CBC - Abnormal; Notable for the following components:   WBC 12.3 (*)    All other components within normal limits  TROPONIN I (HIGH SENSITIVITY) - Abnormal; Notable for the following components:   Troponin I (High Sensitivity) 102 (*)    All other components within normal limits  TROPONIN I (HIGH SENSITIVITY) - Abnormal; Notable for the following components:   Troponin I (High Sensitivity) 90 (*)    All other components within normal limits    EKG EKG Interpretation  Date/Time:  Friday May 25 2019 09:44:56 EDT Ventricular Rate:  84 PR Interval:    QRS Duration: 93 QT Interval:  358 QTC  Calculation: 424 R Axis:   42 Text Interpretation:  Sinus rhythm Anterior infarct, age indeterminate Confirmed by Davonna Belling [phone]) on 05/25/2019 9:48:24 AM   Radiology Dg Chest Portable 1 View  Result Date: 05/25/2019 CLINICAL DATA:  Shortness of breath. EXAM: PORTABLE CHEST 1 VIEW COMPARISON:  05/05/2019 FINDINGS: The heart size and pulmonary vascularity are normal. Chronic elevation of the left hemidiaphragm. Scarring in the right midzone. No discrete infiltrates or effusions. No acute bone abnormality. IMPRESSION: No acute cardiopulmonary disease. Chronic elevation of the left hemidiaphragm. Electronically Signed   By: Lorriane Shire M.D.   On: 05/25/2019 11:00    Procedures Procedures (including critical care time)  Medications Ordered in ED Medications - No data to display   Initial Impression / Assessment and Plan / ED Course  I have reviewed the triage vital signs and the nursing notes.  Pertinent labs & imaging results that were available during my care of the patient were reviewed by me and considered in my medical decision making (see chart for details).        Patient with shortness of breath.  Recent cardiac disease but known COPD also.  Feels better after being in the ER.  Reported was much more dyspneic at home.  Has oxygen at home.  X-ray reassuring.  BNP is somewhat elevated but does not have JVD fluid on his legs or pneumonia/CHF on x-ray.  Will treat with short course of prednisone however will use a low dose due to CHF history.  Patient feels much better and is eager to go home and I think it is reasonable for discharge.  Creatinine mildly increased from baseline.  Troponin appears to be at baseline now.  Follow-up as an outpatient with PCP and cardiology.  I think he is at some risk of returning to the ER, although I do not know if he  would benefit from admission to the hospital at this time  Final Clinical Impressions(s) / ED Diagnoses   Final diagnoses:   SOB (shortness of breath)    ED Discharge Orders         Ordered    predniSONE (DELTASONE) 20 MG tablet  Daily     05/25/19 1347    hydrOXYzine (ATARAX/VISTARIL) 25 MG tablet  Every 8 hours PRN     05/25/19 1347           Davonna Belling, MD 05/25/19 [phone]

## 2019-05-25 NOTE — ED Notes (Signed)
Pt verbalized understanding of discharge paperwork and follow-up care.  °

## 2019-05-25 NOTE — ED Triage Notes (Signed)
Pt arrives via EMS from home with reports of SOB. SOB worse when laying flat. Denies CP. Pt has life vest from cardiac arrest in August. 2.5 L O2 baseline.

## 2019-05-25 NOTE — Discharge Instructions (Signed)
You had been given a prescription for 3 days of a low-dose steroids to help with your breathing if this is related to your COPD.  Your BNP is elevated but did not show other signs of volume overload.  You have also been given a prescription to help if you get episodes of anxiety.  Follow-up with your doctors.

## 2019-05-28 DIAGNOSIS — E1151 Type 2 diabetes mellitus with diabetic peripheral angiopathy without gangrene: Secondary | ICD-10-CM | POA: Diagnosis not present

## 2019-05-28 DIAGNOSIS — N183 Chronic kidney disease, stage 3 (moderate): Secondary | ICD-10-CM | POA: Diagnosis not present

## 2019-05-28 DIAGNOSIS — E1129 Type 2 diabetes mellitus with other diabetic kidney complication: Secondary | ICD-10-CM | POA: Diagnosis not present

## 2019-05-28 DIAGNOSIS — J449 Chronic obstructive pulmonary disease, unspecified: Secondary | ICD-10-CM | POA: Diagnosis not present

## 2019-05-29 ENCOUNTER — Other Ambulatory Visit: Payer: Self-pay | Admitting: Cardiology

## 2019-05-29 DIAGNOSIS — J449 Chronic obstructive pulmonary disease, unspecified: Secondary | ICD-10-CM | POA: Diagnosis not present

## 2019-05-29 DIAGNOSIS — I252 Old myocardial infarction: Secondary | ICD-10-CM

## 2019-05-29 DIAGNOSIS — I2510 Atherosclerotic heart disease of native coronary artery without angina pectoris: Secondary | ICD-10-CM

## 2019-05-29 DIAGNOSIS — C3490 Malignant neoplasm of unspecified part of unspecified bronchus or lung: Secondary | ICD-10-CM | POA: Diagnosis not present

## 2019-05-30 ENCOUNTER — Other Ambulatory Visit: Payer: Self-pay

## 2019-05-30 DIAGNOSIS — R931 Abnormal findings on diagnostic imaging of heart and coronary circulation: Secondary | ICD-10-CM | POA: Diagnosis not present

## 2019-05-30 DIAGNOSIS — I252 Old myocardial infarction: Secondary | ICD-10-CM | POA: Diagnosis not present

## 2019-05-30 DIAGNOSIS — Z7689 Persons encountering health services in other specified circumstances: Secondary | ICD-10-CM | POA: Diagnosis not present

## 2019-05-30 NOTE — Patient Outreach (Signed)
New referral: [place]  Reason:  Patient unable to afford medications/ limited income  Placed call to patient who identified himself.Explained [facility] program as a benefit of his insurance. Patient reports no chest pain recently. Reports he continues to wear his life vest.  Reports office visit with primary MD today. Patient reports that he has a very limited income and he does not have money to afford medications.  Reports most prescriptions are 3.00 but he does not have the money. Reports struggles with being able to afford food.  Patient reports that uses 2 Pharmacy's.  ( [facility] and [facility])   Patient reports that he rides his scooter to appointments and can not take his oxygen on the scooter with the life vest.  Reports he is interested in a small tank for oxygen or a portable concentrator.  Patient states that he has a "caregiver" who comes 7 times per week. Reports this is a man in his 54's.  Patient reports caregiver is in bad shape too. Reports care giver if from [facility].   [phone].   Patient reports that his power bill is past due 400.00.    Patient denies any nursing needs at this time but interested in social worker assistance and pharmacy assistance.   PLAN: referral placed for pharmacy and social worker. Encouraged patient to discuss oxygen needs with MD today during office visit.  Will close to nursing as patient denies needs.   Tomasa Rand, RN, BSN, CEN [facility] [employer] [phone]

## 2019-05-31 ENCOUNTER — Other Ambulatory Visit: Payer: Self-pay

## 2019-05-31 ENCOUNTER — Other Ambulatory Visit: Payer: Self-pay | Admitting: Pharmacist
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
## 2019-05-31 NOTE — Patient Outreach (Signed)
[facility]) Care Management  [place]  05/31/2019  Jimmy Splawn Sr. Jul 22, 1966 [id number]   Reason for referral: Medication Assistance  Referral source: [place] Utilization Management Department Current insurance: [facility]  Per notes, patient's co-pays are ~$3.00 but patient states he cannot afford.    Outreach:  Unsuccessful telephone call attempt #1 to patient.   HIPAA compliant voicemail left requesting a return call  Plan:  -I will mail patient an unsuccessful outreach letter.  -I will make another outreach attempt to patient within 3-4 business days.    Jimmy Nelson, PharmD, [place] [phone]

## 2019-06-06 ENCOUNTER — Ambulatory Visit: Payer: Self-pay

## 2019-06-07 ENCOUNTER — Other Ambulatory Visit: Payer: Self-pay | Admitting: Pharmacist

## 2019-06-07 ENCOUNTER — Ambulatory Visit: Payer: Self-pay | Admitting: Pharmacist

## 2019-06-07 DIAGNOSIS — I213 ST elevation (STEMI) myocardial infarction of unspecified site: Secondary | ICD-10-CM | POA: Diagnosis not present

## 2019-06-07 NOTE — Patient Outreach (Signed)
[facility]) Care Management  [place]   06/07/2019  Jimmy Knack Sr. Aug 06, 1966 [id number]  Reason for referral: Medication Assistance  Referral source: [place] Utilization Management Department Current insurance: [facility] / [place]  Per notes, patient's co-pays are ~$3.00 but patient states he cannot afford.    PMHx:  Lung cancer in remission, ongoing tobacco abuse, amnesia, Crohn's disease, depressionCOPD, HTN, HLD, severe LV systolic dysfunction with EF 25-30%, recent admission for acute MI s/p thrombectomy following by stenting to proximal and mid LAD on 05/05/2019.    Outreach:  Successful telephone call with Mr. Brosch and his fiance.  HIPAA identifiers verified.   Subjective:  Patient reports his fiance organizes his medications weekly into a pill box.  Fiance is also present during conversation.  Patient and spouse state they plan to have prescriptions filled through [facility], mail order pharmacy form [facility] Advantage plan.  They report patient has Medicaid and Medicare and co-pays are usually $1.25 or $3.00.  [facility] may reduce co-pays for Tier 1 medications as well and will deliver medications to home.  Reviewed with patient and fiancee that they will need to request new prescriptions be sent to [place] from PCP and cardiologist.  Patient also reports he is trying to reduce smoking and is wearing daily nicotine patch.    Objective: The ASCVD Risk score Mikey Bussing DC Jr., et al., 2013) failed to calculate for the following reasons:   The patient has a prior MI or stroke diagnosis  Lab Results  Component Value Date   CREATININE 1.58 (H) 05/25/2019   CREATININE 1.25 (H) 05/06/2019   CREATININE 1.14 05/05/2019    Lab Results  Component Value Date   HGBA1C 5.4 05/05/2019    Lipid Panel     Component Value Date/Time   CHOL 117 05/05/2019 0312   TRIG 141 05/05/2019 0312   HDL 33 (L) 05/05/2019  0312   CHOLHDL 3.5 05/05/2019 0312   VLDL 28 05/05/2019 0312   LDLCALC 56 05/05/2019 0312    BP Readings from Last 3 Encounters:  05/25/19 97/82  05/23/19 111/65  05/11/19 105/73    Allergies  Allergen Reactions  . Iodinated Diagnostic Agents     It is noted that pt is allergic to Iodinated contrast media-iv dye,oral contrast    Medications Reviewed Today    Reviewed by Emeline General, Student-PharmD (Student-PharmD) on 05/25/19 at 1109  Med List Status: Follow Up Visit  Medication Order Taking? Sig Documenting Provider Last Dose Status Informant  albuterol (PROVENTIL HFA;VENTOLIN HFA) 108 (90 BASE) MCG/ACT inhaler [id number]  Inhale 2 puffs into the lungs every 4 (four) hours as needed for wheezing. [provider]  Active Spouse/Significant Other  aspirin 81 MG chewable tablet [id number]  Chew 81 mg by mouth daily. [provider]  Active Spouse/Significant Other  atorvastatin (LIPITOR) 80 MG tablet [id number]  Take 1 tablet (80 mg total) by mouth daily at 6 PM. Adrian Prows, MD  Active   divalproex (DEPAKOTE) 250 MG DR tablet [id number]  Take 250-500 mg by mouth See admin instructions. Tale 250 mg in the morning and 500 mg in the evening [provider]  Active Spouse/Significant Other  docusate sodium (COLACE) 100 MG capsule [id number]  Take 100 mg by mouth 2 (two) times daily. [provider]  Active Spouse/Significant Other  ferrous sulfate 325 (65 FE) MG tablet [id number]  Take 1 tablet (325 mg total) by mouth daily with breakfast.  Patrecia Pour, NP  Active Spouse/Significant Other  gabapentin (NEURONTIN) 600 MG tablet [id number]  Take 600 mg by mouth 3 (three) times daily. [provider]  Active Spouse/Significant Other  ipratropium-albuterol (DUONEB) 0.5-2.5 (3) MG/3ML SOLN [id number]  Inhale 3 mLs into the lungs as needed. [provider]  Active Spouse/Significant Other  losartan (COZAAR) 25 MG tablet [id number]  Take 1 tablet (25  mg total) by mouth every evening. Adrian Prows, MD  Active   metoprolol succinate (TOPROL-XL) 100 MG 24 hr tablet [id number]  Take 1 tablet (100 mg total) by mouth 2 (two) times daily. Take with or immediately following a meal. Adrian Prows, MD  Active   montelukast (SINGULAIR) 10 MG tablet [id number]  Take 10 mg by mouth daily. [provider]  Active Spouse/Significant Other  Multiple Vitamins-Minerals (MULTIVITAMIN WITH MINERALS) tablet [id number]  Take 1 tablet by mouth daily. Patrecia Pour, NP  Active Spouse/Significant Other  nicotine (NICODERM CQ - DOSED IN MG/24 HOURS) 21 mg/24hr patch [id number]  Place 1 patch (21 mg total) onto the skin daily. Adrian Prows, MD  Active   pyridOXINE (VITAMIN B-6) 100 MG tablet [id number]  Take 100 mg by mouth daily. [provider]  Active Spouse/Significant Other  SYMBICORT 160-4.5 MCG/ACT inhaler [id number]  Inhale 1 puff into the lungs 2 (two) times daily. [provider]  Active Spouse/Significant Other  ticagrelor (BRILINTA) 90 MG TABS tablet [id number]  Take 1 tablet (90 mg total) by mouth 2 (two) times daily. Adrian Prows, MD  Active   traZODone (DESYREL) 50 MG tablet [id number]  Take 1 tablet (50 mg total) by mouth at bedtime.  Patient taking differently: Take 100 mg by mouth at bedtime as needed for sleep.    Patrecia Pour, NP  Active Spouse/Significant Other  vitamin B-12 (CYANOCOBALAMIN) 1000 MCG tablet [id number]  Take 1,000 mcg by mouth daily. [provider]  Active Spouse/Significant Other          Assessment: Drugs sorted by system:  Neurologic/Psychologic:divalproex, gabapentin, hydroxyzine, trazodone  Hematologic: aspirin 34m, Brilinta, ferrous sulfate   Cardiovascular: atorvastatin, metoprolol succinate, losartan  Pulmonary/Allergy: albuterol inhaler, Symbicort inhaler, Duonebs, montelukast  Topical: nicotine patch  Vitamins/Minerals/Supplements: MVI, vitamin B6, B12  Medication Review Findings:   . Wife reports no SL NTG RX . Patient reports compliance with all medications, no questions or concerns   Medication Assistance Findings:  No medication assistance needs identified  Plan: . Will reach out to cardiologist re: SL NTG . [facility]. Am happy to assist in the future again as needed.   CRalene Bathe PharmD, [phone]

## 2019-06-08 ENCOUNTER — Ambulatory Visit: Payer: Self-pay

## 2019-06-08 ENCOUNTER — Other Ambulatory Visit: Payer: Self-pay

## 2019-06-08 NOTE — Patient Outreach (Signed)
[facility]) Care Management  06/08/2019  Jimmy Drewes Sr. [birth year] [id number]   Unsuccessful attempt to follow up with patient today regarding multiple resources provided last week.  Left voicemail message.  Will attempt to reach again within four business days.  Ronn Melena, BSW Social Worker [phone]

## 2019-06-11 ENCOUNTER — Other Ambulatory Visit: Payer: Self-pay

## 2019-06-11 DIAGNOSIS — I2510 Atherosclerotic heart disease of native coronary artery without angina pectoris: Secondary | ICD-10-CM

## 2019-06-11 DIAGNOSIS — E7800 Pure hypercholesterolemia, unspecified: Secondary | ICD-10-CM

## 2019-06-11 DIAGNOSIS — I10 Essential (primary) hypertension: Secondary | ICD-10-CM

## 2019-06-11 DIAGNOSIS — I255 Ischemic cardiomyopathy: Secondary | ICD-10-CM

## 2019-06-11 MED ORDER — METOPROLOL SUCCINATE ER 100 MG PO TB24: 100.0000 mg | ORAL_TABLET | Freq: Two times a day (BID) | ORAL | 1 refills | Status: DC

## 2019-06-11 MED ORDER — ATORVASTATIN CALCIUM 80 MG PO TABS: 80.0000 mg | ORAL_TABLET | Freq: Every day | ORAL | 2 refills | Status: DC

## 2019-06-11 MED ORDER — LOSARTAN POTASSIUM 25 MG PO TABS: 25.0000 mg | ORAL_TABLET | Freq: Every evening | ORAL | 1 refills | Status: DC

## 2019-06-12 ENCOUNTER — Other Ambulatory Visit: Payer: Self-pay

## 2019-06-12 NOTE — Patient Outreach (Signed)
[facility]) Care Management  06/12/2019  Jimmy Corrales Sr. 1965/11/17 [id number]   Contacted patient today to follow up on financial resources provided during last outreach and to check status of renewal of personal care services.  Spoke to patient briefly but he requested call back later in the week due to dealing with some family situations.      Ronn Melena, BSW Social Worker [phone]

## 2019-06-13 ENCOUNTER — Other Ambulatory Visit: Payer: Self-pay

## 2019-06-13 ENCOUNTER — Encounter: Payer: Self-pay | Admitting: Cardiology

## 2019-06-13 ENCOUNTER — Ambulatory Visit (INDEPENDENT_AMBULATORY_CARE_PROVIDER_SITE_OTHER): Payer: Medicare Other | Admitting: Cardiology

## 2019-06-13 VITALS — BP 103/66 | HR 68 | Temp 95.8°F | Ht 71.0 in | Wt 154.0 lb

## 2019-06-13 DIAGNOSIS — I2510 Atherosclerotic heart disease of native coronary artery without angina pectoris: Secondary | ICD-10-CM

## 2019-06-13 DIAGNOSIS — I255 Ischemic cardiomyopathy: Secondary | ICD-10-CM

## 2019-06-13 DIAGNOSIS — I10 Essential (primary) hypertension: Secondary | ICD-10-CM | POA: Diagnosis not present

## 2019-06-13 DIAGNOSIS — I5042 Chronic combined systolic (congestive) and diastolic (congestive) heart failure: Secondary | ICD-10-CM

## 2019-06-13 DIAGNOSIS — F17209 Nicotine dependence, unspecified, with unspecified nicotine-induced disorders: Secondary | ICD-10-CM

## 2019-06-13 MED ORDER — ENTRESTO 24-26 MG PO TABS: 1.0000 | ORAL_TABLET | Freq: Two times a day (BID) | ORAL | 1 refills | Status: DC

## 2019-06-13 MED ORDER — NICOTINE 14 MG/24HR TD PT24: 14.0000 mg | MEDICATED_PATCH | Freq: Every day | TRANSDERMAL | 1 refills | Status: DC

## 2019-06-13 MED ORDER — NITROGLYCERIN 0.4 MG SL SUBL: 0.4000 mg | SUBLINGUAL_TABLET | SUBLINGUAL | 3 refills | Status: DC | PRN

## 2019-06-13 NOTE — Progress Notes (Signed)
Primary Physician/Referring:  Maryella Shivers, MD  Patient ID: Jimmy Mort., male    DOB: 10-19-1965, 53 y.o.   MRN: [id number]  Chief Complaint  Patient presents with  . Follow-up    4 week  . Cardiomyopathy  . Coronary Artery Disease   HPI:    HPI: Jimmy Tavano Sr.  is a 53 y.o. Caucasian male with lung cancer in remission, ongoing tobacco use disorder, COPD, hypertension, admitted with acute pulmonary and underwent successful thrombectomy followed by stenting to proximal and mid LAD on 05/05/2019.  LVEF 25 to 30% and had NSVT on telemetry.    States that he has been compliant with taking his medications, has not had any further chest pain except sharp pain lasting a few seconds a couple times different from angina and has not used sublingual nitroglycerin. No leg edema, wearing LifeVest, had called me a week ago stating it was not working and hence had the device changed by the company.    Due to tachycardia, I had  increased the dose of metoprolol succinate from 50-100 mg b.i.d. and wanted to restart him on Entresto but his blood pressure was low on his last office visit.  States that he wants to live and states that he has made a lot of lifestyle changes including diet changes.  Has not had any chest pain, dyspnea is remained stable and in fact states that it is improving.  No leg edema, dizziness or syncope.  Past Medical History:  Diagnosis Date  . Amnesia   . Anemia   . COPD (chronic obstructive pulmonary disease) ([place])   . Crohn's disease ([place]) Deteriorating disk  . Depression   . History of MI (myocardial infarction) 05/05/2019  . History of stroke    2  . Hyperlipidemia   . Hypertension   . Lung cancer ([place])   . Mental disorder   . Sleep apnea   . Stroke [facility])    Past Surgical History:  Procedure Laterality Date  . APPENDECTOMY    . CORONARY/GRAFT ACUTE MI REVASCULARIZATION N/A 05/05/2019   Procedure: Coronary/Graft Acute MI  Revascularization;  Surgeon: Adrian Prows, MD;  Location: [facility];  Service: Cardiovascular;  Laterality: N/A;  . EYE SURGERY    . intestestine for chrohns disease    . LEFT HEART CATH AND CORONARY ANGIOGRAPHY N/A 05/05/2019   Procedure: LEFT HEART CATH AND CORONARY ANGIOGRAPHY;  Surgeon: Adrian Prows, MD;  Location: [facility];  Service: Cardiovascular;  Laterality: N/A;  . PORT-A-CATH REMOVAL     Social History   Socioeconomic History  . Marital status: Significant Other    Spouse name: Not on file  . Number of children: 8  . Years of education: Not on file  . Highest education level: Not on file  Occupational History  . Not on file  Social Needs  . Financial resource strain: Hard  . Food insecurity    Worry: Sometimes true    Inability: Sometimes true  . Transportation needs    Medical: No    Non-medical: Yes  Tobacco Use  . Smoking status: Current Every Day Smoker    Packs/day: 0.25    Types: Cigarettes  . Smokeless tobacco: Current User    Types: Snuff  Substance and Sexual Activity  . Alcohol use: Not Currently  . Drug use: No  . Sexual activity: Yes  Lifestyle  . Physical activity    Days per week: Not on file  Minutes per session: Not on file  . Stress: Not on file  Relationships  . Social Herbalist on phone: Not on file    Gets together: Not on file    Attends religious service: Not on file    Active member of club or organization: Not on file    Attends meetings of clubs or organizations: Not on file    Relationship status: Not on file  . Intimate partner violence    Fear of current or ex partner: Not on file    Emotionally abused: Not on file    Physically abused: Not on file    Forced sexual activity: Not on file  Other Topics Concern  . Not on file  Social History Narrative  . Not on file   ROS  Review of Systems  Constitution: Negative for chills, decreased appetite, malaise/fatigue and weight gain.  Cardiovascular:  Negative for chest pain, dyspnea on exertion, leg swelling and syncope.  Respiratory: Positive for shortness of breath.   Endocrine: Negative for cold intolerance.  Hematologic/Lymphatic: Does not bruise/bleed easily.  Musculoskeletal: Negative for joint swelling.  Gastrointestinal: Negative for abdominal pain, anorexia, change in bowel habit, hematochezia and melena.  Neurological: Negative for headaches and light-headedness.  Psychiatric/Behavioral: Positive for depression. Negative for substance abuse. The patient is nervous/anxious.   All other systems reviewed and are negative.  Objective  Blood pressure 103/66, pulse 68, temperature (!) 95.8 F (35.4 C), height 5' 11"  (1.803 m), weight 154 lb (69.9 kg), SpO2 96 %. Body mass index is 21.48 kg/m.   Physical Exam  Constitutional: He is oriented to person, place, and time. He appears well-nourished. He appears cachectic. No distress.  Appears older than stated age  HENT:  Head: Atraumatic.  Eyes: Conjunctivae are normal.  Neck: Neck supple. No JVD present. No thyromegaly present.  Cardiovascular: Normal rate, regular rhythm, intact distal pulses and normal pulses. Exam reveals distant heart sounds. Exam reveals no gallop.  No murmur heard. Intact distal pulse. No leg edema. No JVD.  Right femoral arterial access site in venous access site without any complications or hematoma.  Pulmonary/Chest: Effort normal. Tachypnea noted. He has no decreased breath sounds.  Chest barrel shaped. Prolonged expiration.  Abdominal: Soft. Bowel sounds are normal.  Musculoskeletal: Normal range of motion.  Neurological: He is alert and oriented to person, place, and time.  Skin: Skin is warm and dry.  Psychiatric: His mood appears anxious.   Radiology: No results found.  Laboratory examination:   Recent Labs    05/05/19 0312 05/05/19 0541 05/06/19 0246 05/25/19 1049  NA 137  --  140 139  K 2.9*  --  3.0* 3.1*  CL 100  --  101 106  CO2  24  --  24 25  GLUCOSE 157*  --  170* 93  BUN 8  --  13 10  CREATININE 1.10 1.14 1.25* 1.58*  CALCIUM 9.0  --  9.4 8.5*  GFRNONAA >60 >60 >60 49*  GFRAA >60 >60 >60 57*   CMP Latest Ref Rng & Units 05/25/2019 05/06/2019 05/05/2019  Glucose 70 - 99 mg/dL 93 170(H) -  BUN 6 - 20 mg/dL 10 13 -  Creatinine 0.61 - 1.24 mg/dL 1.58(H) 1.25(H) 1.14  Sodium 135 - 145 mmol/L 139 140 -  Potassium 3.5 - 5.1 mmol/L 3.1(L) 3.0(L) -  Chloride 98 - 111 mmol/L 106 101 -  CO2 22 - 32 mmol/L 25 24 -  Calcium 8.9 - 10.3  mg/dL 8.5(L) 9.4 -  Total Protein 6.5 - 8.1 g/dL 6.2(L) - -  Total Bilirubin 0.3 - 1.2 mg/dL 0.5 - -  Alkaline Phos 38 - 126 U/L 61 - -  AST 15 - 41 U/L 21 - -  ALT 0 - 44 U/L 24 - -   CBC Latest Ref Rng & Units 05/25/2019 05/06/2019 05/05/2019  WBC 4.0 - 10.5 K/uL 12.3(H) 20.6(H) 10.9(H)  Hemoglobin 13.0 - 17.0 g/dL 13.5 16.4 15.6  Hematocrit 39.0 - 52.0 % 42.8 51.2 47.3  Platelets 150 - 400 K/uL 276 346 286   Lipid Panel     Component Value Date/Time   CHOL 117 05/05/2019 0312   TRIG 141 05/05/2019 0312   HDL 33 (L) 05/05/2019 0312   CHOLHDL 3.5 05/05/2019 0312   VLDL 28 05/05/2019 0312   LDLCALC 56 05/05/2019 0312   HEMOGLOBIN A1C Lab Results  Component Value Date   HGBA1C 5.4 05/05/2019   MPG 108.28 05/05/2019   TSH No results for input(s): TSH in the last 8760 hours. Medications   Current Outpatient Medications  Medication Instructions  . albuterol (PROVENTIL HFA;VENTOLIN HFA) 108 (90 BASE) MCG/ACT inhaler 2 puffs, Inhalation, Every 4 hours PRN  . aspirin 81 mg, Oral, Daily  . atorvastatin (LIPITOR) 80 mg, Oral, Daily-1800  . BRILINTA 90 MG TABS tablet TAKE 1 TABLET (90 MG TOTAL) BY MOUTH 2 (TWO) TIMES DAILY.  . divalproex (DEPAKOTE) 250-500 mg, Oral, See admin instructions, Tale 250 mg in the morning and 500 mg in the evening  . docusate sodium (COLACE) 100 mg, Oral, 2 times daily  . ferrous sulfate 325 mg, Oral, Daily with breakfast  . gabapentin (NEURONTIN) 600 mg,  Oral, 3 times daily  . hydrOXYzine (ATARAX/VISTARIL) 25 mg, Oral, Every 8 hours PRN  . ipratropium-albuterol (DUONEB) 0.5-2.5 (3) MG/3ML SOLN 3 mLs, Inhalation, As needed  . metoprolol succinate (TOPROL-XL) 100 mg, Oral, 2 times daily, Take with or immediately following a meal.  . montelukast (SINGULAIR) 10 mg, Oral, Daily  . Multiple Vitamins-Minerals (MULTIVITAMIN WITH MINERALS) tablet 1 tablet, Oral, Daily  . nicotine (NICODERM CQ - DOSED IN MG/24 HOURS) 14 mg, Transdermal, Daily  . nitroGLYCERIN (NITROSTAT) 0.4 mg, Sublingual, Every 5 min PRN  . pyridOXINE (VITAMIN B-6) 100 mg, Oral, Daily  . sacubitril-valsartan (ENTRESTO) 24-26 MG 1 tablet, Oral, 2 times daily  . SYMBICORT 160-4.5 MCG/ACT inhaler 1 puff, Inhalation, 2 times daily  . traZODone (DESYREL) 100 mg, Oral, At bedtime PRN  . vitamin B-12 (CYANOCOBALAMIN) 1,000 mcg, Oral, Daily    Cardiac Studies:   Coronary Angiography 05/05/2019:Prox LAD to Mid LAD lesion is 100% stenosed S/P aspiration thrombectomy followed by overlapping 3.5 x 20 and a 3.0 x 12 mm Synergy DES distally, stenosis reduced from 10 percent to 0%. D1 has ostial 20 to 30% stenosis. Circumflex: Mild disease in the proximal and, OM 2 is very tiny and severely diseased. RCA is dominant, proximal 50% stenosis. LVEDP markedly elevated at 27 mmHg. EF 10 to 15% with anterolateral akinesis. 110 mL contrast utilized, 6.9 minutes of fluoroscopy time. Recommendation: Patient will be kept in the intensive care unit, he may need a hospital admission for 48 hours or more in view of severe LV dysfunction unless he does well clinically. I discussed the findings with his wife on the telephone.  Echocardiogram 05/17/2019: 1. The left ventricle has severely reduced systolic function, with an ejection fraction of 25-30%. The cavity size was normal. Left ventricular diastolic function could not be evaluated.  Left ventricular diffuse hypokinesis. 2. The right ventricle has  normal systolic function. The cavity was normal. There is no increase in right ventricular wall thickness. 3. Trivial pericardial effusion is present. 4. The aorta is normal in size and structure. 5. The aortic root is normal in size and structure.  Assessment     ICD-10-CM   1. Coronary artery disease involving native coronary artery of native heart without angina pectoris  I25.10 nitroGLYCERIN (NITROSTAT) 0.4 MG SL tablet    CBC    CMP14+EGFR    Lipid Panel With LDL/HDL Ratio  2. Ischemic cardiomyopathy  I25.5   3. Chronic combined systolic and diastolic heart failure (HCC)  I50.42 sacubitril-valsartan (ENTRESTO) 24-26 MG  4. Essential hypertension  I10 nicotine (NICODERM CQ - DOSED IN MG/24 HOURS) 14 mg/24hr patch  5. Tobacco use disorder, continuous  F17.209     Discontinued Losartan, change to Entresto  EKG 05/11/2019: Sinus tachycardia at rate of 105 bpm, normal axis, anteroseptal infarct old.  Persistent ST elevation in V1 to V4, may suggest aneurysm formation.  Normal QT interval.   EKG 05/05/2019 at 4:48 AM: Sinus tachycardia heart rate of 102 bpm, right atrial enlargement, normal axis. 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 Anteroseptal infarct acute. No significant change since prior EKG at 2 AM.  Recommendations:   This is a 30 day follow-up visit after his recent myocardial infarction. Presently wearing LifeVest.  Patient is presently doing well and is extremely motivated completely quitting smoking, he is now smoking about 2-3 cigarettes a day.  I have refilled his nicotine patches.  Discontinued losartan, change to Entresto 24/26 milligrams daily, obtain CBC, CMP and lipid profile testing in 2 weeks from now, I'll like to see him back in 4 weeks.  Heart rate is much improved since being on high dose of beta blocker therapy.  He is tolerating high-dose statins as well without side effects.  Adrian Prows, MD, [facility] 06/13/2019, 4:45 PM [facility]. [place] Pager: [phone]  Office: [phone] If no answer Cell [phone]

## 2019-06-15 ENCOUNTER — Ambulatory Visit: Payer: Self-pay

## 2019-06-15 ENCOUNTER — Other Ambulatory Visit: Payer: Self-pay

## 2019-06-15 NOTE — Patient Outreach (Signed)
[facility]) Care Management  06/15/2019  Jimmy Dinovo Sr. 1966/08/18 [id number]   Attempted to contact patient today to follow up on financial resources provided and status of renewal of personal care services.  Left message.  Will attempt to reach again within four business days  Ronn Melena, [place] Social Worker [phone]

## 2019-06-18 ENCOUNTER — Ambulatory Visit: Payer: Self-pay

## 2019-06-18 ENCOUNTER — Other Ambulatory Visit: Payer: Self-pay

## 2019-06-18 NOTE — Patient Outreach (Signed)
[facility]) Care Management  06/18/2019  Jimmy Aldrete Sr. 1966-09-25 [id number]   Attempted to contact patient today to follow up on financial resources provided and status of renewal of personal care services.  Left message.  Will close case if no return call by 06/25/19.  Ronn Melena, BSW Social Worker [phone]

## 2019-06-25 ENCOUNTER — Other Ambulatory Visit: Payer: Self-pay

## 2019-06-25 NOTE — Patient Outreach (Signed)
[facility]) Care Management  06/25/2019  Ehtan Delfavero Sr. 12-Oct-1965 [id number]   [facility] Social Work case closure due to inability to maintain contact.  Ronn Melena, BSW Social Worker [phone]

## 2019-06-27 DIAGNOSIS — E1129 Type 2 diabetes mellitus with other diabetic kidney complication: Secondary | ICD-10-CM | POA: Diagnosis not present

## 2019-06-27 DIAGNOSIS — J449 Chronic obstructive pulmonary disease, unspecified: Secondary | ICD-10-CM | POA: Diagnosis not present

## 2019-06-27 DIAGNOSIS — E1151 Type 2 diabetes mellitus with diabetic peripheral angiopathy without gangrene: Secondary | ICD-10-CM | POA: Diagnosis not present

## 2019-06-27 DIAGNOSIS — N183 Chronic kidney disease, stage 3 (moderate): Secondary | ICD-10-CM | POA: Diagnosis not present

## 2019-06-28 DIAGNOSIS — J449 Chronic obstructive pulmonary disease, unspecified: Secondary | ICD-10-CM | POA: Diagnosis not present

## 2019-06-28 DIAGNOSIS — C3490 Malignant neoplasm of unspecified part of unspecified bronchus or lung: Secondary | ICD-10-CM | POA: Diagnosis not present

## 2019-06-30 DIAGNOSIS — R079 Chest pain, unspecified: Secondary | ICD-10-CM | POA: Diagnosis not present

## 2019-06-30 DIAGNOSIS — I10 Essential (primary) hypertension: Secondary | ICD-10-CM | POA: Diagnosis not present

## 2019-06-30 DIAGNOSIS — R0989 Other specified symptoms and signs involving the circulatory and respiratory systems: Secondary | ICD-10-CM | POA: Diagnosis not present

## 2019-06-30 DIAGNOSIS — R001 Bradycardia, unspecified: Secondary | ICD-10-CM | POA: Diagnosis not present

## 2019-06-30 DIAGNOSIS — I429 Cardiomyopathy, unspecified: Secondary | ICD-10-CM | POA: Diagnosis not present

## 2019-06-30 DIAGNOSIS — Z95810 Presence of automatic (implantable) cardiac defibrillator: Secondary | ICD-10-CM | POA: Diagnosis not present

## 2019-06-30 DIAGNOSIS — J449 Chronic obstructive pulmonary disease, unspecified: Secondary | ICD-10-CM | POA: Diagnosis not present

## 2019-06-30 DIAGNOSIS — Z85810 Personal history of malignant neoplasm of tongue: Secondary | ICD-10-CM | POA: Diagnosis not present

## 2019-06-30 DIAGNOSIS — J984 Other disorders of lung: Secondary | ICD-10-CM | POA: Diagnosis not present

## 2019-07-06 ENCOUNTER — Encounter: Payer: Self-pay | Admitting: Cardiology

## 2019-07-06 ENCOUNTER — Ambulatory Visit (INDEPENDENT_AMBULATORY_CARE_PROVIDER_SITE_OTHER): Payer: Medicare Other | Admitting: Cardiology

## 2019-07-06 ENCOUNTER — Other Ambulatory Visit: Payer: Self-pay

## 2019-07-06 VITALS — BP 126/77 | HR 90 | Temp 98.0°F | Ht 71.0 in | Wt 158.3 lb

## 2019-07-06 DIAGNOSIS — I255 Ischemic cardiomyopathy: Secondary | ICD-10-CM

## 2019-07-06 DIAGNOSIS — I5042 Chronic combined systolic (congestive) and diastolic (congestive) heart failure: Secondary | ICD-10-CM | POA: Diagnosis not present

## 2019-07-06 DIAGNOSIS — I2510 Atherosclerotic heart disease of native coronary artery without angina pectoris: Secondary | ICD-10-CM

## 2019-07-06 MED ORDER — ENTRESTO 49-51 MG PO TABS: 1.0000 | ORAL_TABLET | Freq: Two times a day (BID) | ORAL | 2 refills | Status: DC

## 2019-07-06 NOTE — Patient Instructions (Addendum)
Have increased the dose of Entresto from 24/25 mg to 49/51 mg to be taken twice daily.  STOP LOSARTAN  Please get labs done they have been ordered, he can go to [place], do that in 10 days from now after use start higher dose of Entresto.  Please bring all her medications with to on the next office visit.  I will be sending a referral to cardiac rehabilitation in [place] and they'll be contacting you to enroll in cardiac rehabilitation and exercise.  I'll see her back here in our office in 6 weeks, I will be repeating a echocardiogram to see if her heart function is improved.

## 2019-07-06 NOTE — Progress Notes (Signed)
Primary Physician/Referring:  Maryella Shivers, MD  Patient ID: Jimmy Mort., male    DOB: [birth year], 53 y.o.   MRN: [id number]  Chief Complaint  Patient presents with  . Congestive Heart Failure  . Coronary Artery Disease  . Follow-up    Entresto Titration   HPI:    HPI: Jimmy Boxx Sr.  is a 53 y.o. Caucasian male with lung cancer in remission, ongoing tobacco use disorder, COPD, hypertension, admitted with acute pulmonary and underwent successful thrombectomy followed by stenting to proximal and mid LAD on 05/05/2019.  LVEF 25 to 30% and had NSVT on telemetry.    States that he has been compliant with taking his medications, has not had any further chest pain. He is tolerating Entresto and high dose BB without dizziness or worsening dyspnea.   States that he wants to live and states that he has made a lot of lifestyle changes including diet changes. Now smoking only 3 cigarettes a day and having difficulty in quitting smoking.  Has not had any chest pain, dyspnea is remained stable and in fact states that it is improving.  No leg edema, dizziness or syncope.  Past Medical History:  Diagnosis Date  . Amnesia   . Anemia   . COPD (chronic obstructive pulmonary disease) ([place])   . Crohn's disease ([place]) Deteriorating disk  . Depression   . History of MI (myocardial infarction) 05/05/2019  . History of stroke    2  . Hyperlipidemia   . Hypertension   . Lung cancer ([place])   . Mental disorder   . Sleep apnea   . Stroke [facility]    Past Surgical History:  Procedure Laterality Date  . APPENDECTOMY    . CORONARY/GRAFT ACUTE MI REVASCULARIZATION N/A 05/05/2019   Procedure: Coronary/Graft Acute MI Revascularization;  Surgeon: Adrian Prows, MD;  Location: [place] [facility];  Service: Cardiovascular;  Laterality: N/A;  . EYE SURGERY    . intestestine for chrohns disease    . LEFT HEART CATH AND CORONARY ANGIOGRAPHY N/A 05/05/2019   Procedure: LEFT HEART CATH AND  CORONARY ANGIOGRAPHY;  Surgeon: Adrian Prows, MD;  Location: [place] [facility];  Service: Cardiovascular;  Laterality: N/A;  . PORT-A-CATH REMOVAL     Social History   Socioeconomic History  . Marital status: Significant Other    Spouse name: Not on file  . Number of children: 8  . Years of education: Not on file  . Highest education level: Not on file  Occupational History  . Not on file  Social Needs  . Financial resource strain: Hard  . Food insecurity    Worry: Sometimes true    Inability: Sometimes true  . Transportation needs    Medical: No    Non-medical: Yes  Tobacco Use  . Smoking status: Current Every Day Smoker    Packs/day: 0.25    Types: Cigarettes  . Smokeless tobacco: Current User    Types: Snuff  Substance and Sexual Activity  . Alcohol use: Not Currently  . Drug use: No  . Sexual activity: Yes  Lifestyle  . Physical activity    Days per week: Not on file    Minutes per session: Not on file  . Stress: Not on file  Relationships  . Social Herbalist on phone: Not on file    Gets together: Not on file    Attends religious service: Not on file    Active member of club or  organization: Not on file    Attends meetings of clubs or organizations: Not on file    Relationship status: Not on file  . Intimate partner violence    Fear of current or ex partner: Not on file    Emotionally abused: Not on file    Physically abused: Not on file    Forced sexual activity: Not on file  Other Topics Concern  . Not on file  Social History Narrative  . Not on file   ROS  Review of Systems  Constitution: Negative for chills, decreased appetite, malaise/fatigue and weight gain.  Cardiovascular: Negative for chest pain, dyspnea on exertion, leg swelling and syncope.  Respiratory: Positive for shortness of breath.   Endocrine: Negative for cold intolerance.  Hematologic/Lymphatic: Does not bruise/bleed easily.  Musculoskeletal: Negative for joint swelling.   Gastrointestinal: Negative for abdominal pain, anorexia, change in bowel habit, hematochezia and melena.  Neurological: Negative for headaches and light-headedness.  Psychiatric/Behavioral: Positive for depression. Negative for substance abuse. The patient is nervous/anxious.   All other systems reviewed and are negative.  Objective  Blood pressure 126/77, pulse 90, temperature 98 F (36.7 C), height 5' 11"  (1.803 m), weight 158 lb 4.8 oz (71.8 kg), SpO2 99 %. Body mass index is 22.08 kg/m.   Physical Exam  Constitutional: He is oriented to person, place, and time. He appears well-nourished. He appears cachectic. No distress.  Appears older than stated age  HENT:  Head: Atraumatic.  Eyes: Conjunctivae are normal.  Neck: Neck supple. No JVD present. No thyromegaly present.  Cardiovascular: Normal rate, regular rhythm, intact distal pulses and normal pulses. Exam reveals distant heart sounds. Exam reveals no gallop.  No murmur heard. Intact distal pulse. No leg edema. No JVD.  Right femoral arterial access site in venous access site without any complications or hematoma.  Pulmonary/Chest: Effort normal. Tachypnea noted. He has no decreased breath sounds.  Chest barrel shaped. Prolonged expiration.  Abdominal: Soft. Bowel sounds are normal.  Musculoskeletal: Normal range of motion.  Neurological: He is alert and oriented to person, place, and time.  Skin: Skin is warm and dry.  Psychiatric: His mood appears anxious.   Radiology: No results found.  Laboratory examination:   Recent Labs    05/05/19 0312 05/05/19 0541 05/06/19 0246 05/25/19 1049  NA 137  --  140 139  K 2.9*  --  3.0* 3.1*  CL 100  --  101 106  CO2 24  --  24 25  GLUCOSE 157*  --  170* 93  BUN 8  --  13 10  CREATININE 1.10 1.14 1.25* 1.58*  CALCIUM 9.0  --  9.4 8.5*  GFRNONAA >60 >60 >60 49*  GFRAA >60 >60 >60 57*   CMP Latest Ref Rng & Units 05/25/2019 05/06/2019 05/05/2019  Glucose 70 - 99 mg/dL 93 170(H) -   BUN 6 - 20 mg/dL 10 13 -  Creatinine 0.61 - 1.24 mg/dL 1.58(H) 1.25(H) 1.14  Sodium 135 - 145 mmol/L 139 140 -  Potassium 3.5 - 5.1 mmol/L 3.1(L) 3.0(L) -  Chloride 98 - 111 mmol/L 106 101 -  CO2 22 - 32 mmol/L 25 24 -  Calcium 8.9 - 10.3 mg/dL 8.5(L) 9.4 -  Total Protein 6.5 - 8.1 g/dL 6.2(L) - -  Total Bilirubin 0.3 - 1.2 mg/dL 0.5 - -  Alkaline Phos 38 - 126 U/L 61 - -  AST 15 - 41 U/L 21 - -  ALT 0 - 44 U/L 24 - -  CBC Latest Ref Rng & Units 05/25/2019 05/06/2019 05/05/2019  WBC 4.0 - 10.5 K/uL 12.3(H) 20.6(H) 10.9(H)  Hemoglobin 13.0 - 17.0 g/dL 13.5 16.4 15.6  Hematocrit 39.0 - 52.0 % 42.8 51.2 47.3  Platelets 150 - 400 K/uL 276 346 286   Lipid Panel     Component Value Date/Time   CHOL 117 05/05/2019 0312   TRIG 141 05/05/2019 0312   HDL 33 (L) 05/05/2019 0312   CHOLHDL 3.5 05/05/2019 0312   VLDL 28 05/05/2019 0312   LDLCALC 56 05/05/2019 0312   HEMOGLOBIN A1C Lab Results  Component Value Date   HGBA1C 5.4 05/05/2019   MPG 108.28 05/05/2019   TSH No results for input(s): TSH in the last 8760 hours. Medications   Current Outpatient Medications  Medication Instructions  . albuterol (PROVENTIL HFA;VENTOLIN HFA) 108 (90 BASE) MCG/ACT inhaler 2 puffs, Inhalation, Every 4 hours PRN  . aspirin 81 mg, Oral, Daily  . atorvastatin (LIPITOR) 80 mg, Oral, Daily-1800  . BRILINTA 90 MG TABS tablet TAKE 1 TABLET (90 MG TOTAL) BY MOUTH 2 (TWO) TIMES DAILY.  . divalproex (DEPAKOTE) 250-500 mg, Oral, See admin instructions, Tale 250 mg in the morning and 500 mg in the evening  . docusate sodium (COLACE) 100 mg, Oral, 2 times daily  . ferrous sulfate 325 mg, Oral, Daily with breakfast  . gabapentin (NEURONTIN) 600 mg, Oral, 3 times daily  . hydrOXYzine (ATARAX/VISTARIL) 25 mg, Oral, Every 8 hours PRN  . ipratropium-albuterol (DUONEB) 0.5-2.5 (3) MG/3ML SOLN 3 mLs, Inhalation, As needed  . losartan (COZAAR) 25 MG tablet 1 tablet, Oral, 2 times daily  . metoprolol succinate  (TOPROL-XL) 50 MG 24 hr tablet 1 tablet, Oral, Daily  . metoprolol succinate (TOPROL-XL) 100 mg, Oral, 2 times daily, Take with or immediately following a meal.  . montelukast (SINGULAIR) 10 mg, Oral, Daily  . Multiple Vitamins-Minerals (MULTIVITAMIN WITH MINERALS) tablet 1 tablet, Oral, Daily  . nicotine (NICODERM CQ - DOSED IN MG/24 HOURS) 14 mg, Transdermal, Daily  . nitroGLYCERIN (NITROSTAT) 0.4 mg, Sublingual, Every 5 min PRN  . pyridOXINE (VITAMIN B-6) 100 mg, Oral, Daily  . sacubitril-valsartan (ENTRESTO) 24-26 MG 1 tablet, Oral, 2 times daily  . SYMBICORT 160-4.5 MCG/ACT inhaler 1 puff, Inhalation, 2 times daily  . traZODone (DESYREL) 100 mg, Oral, At bedtime PRN  . vitamin B-12 (CYANOCOBALAMIN) 1,000 mcg, Oral, Daily    Cardiac Studies:   Coronary Angiography 05/05/2019:Prox LAD to Mid LAD lesion is 100% stenosed S/P aspiration thrombectomy followed by overlapping 3.5 x 20 and a 3.0 x 12 mm Synergy DES distally, stenosis reduced from 10 percent to 0%. D1 has ostial 20 to 30% stenosis. Circumflex: Mild disease in the proximal and, OM 2 is very tiny and severely diseased. RCA is dominant, proximal 50% stenosis. LVEDP markedly elevated at 27 mmHg. EF 10 to 15% with anterolateral akinesis. 110 mL contrast utilized, 6.9 minutes of fluoroscopy time. Recommendation: Patient will be kept in the intensive care unit, he may need a hospital admission for 48 hours or more in view of severe LV dysfunction unless he does well clinically. I discussed the findings with his wife on the telephone.  Echocardiogram 05/17/2019: 1. The left ventricle has severely reduced systolic function, with an ejection fraction of 25-30%. The cavity size was normal. Left ventricular diastolic function could not be evaluated. Left ventricular diffuse hypokinesis. 2. The right ventricle has normal systolic function. The cavity was normal. There is no increase in right ventricular wall thickness. 3. Trivial  pericardial effusion is present. 4. The aorta is normal in size and structure. 5. The aortic root is normal in size and structure.  Assessment     ICD-10-CM   1. Coronary artery disease involving native coronary artery of native heart without angina pectoris  I25.10   2. Ischemic cardiomyopathy  I25.5   3. Chronic combined systolic and diastolic heart failure (HCC)  I50.42     Discontinued Losartan, change to Entresto  EKG 05/11/2019: Sinus tachycardia at rate of 105 bpm, normal axis, anteroseptal infarct old.  Persistent ST elevation in V1 to V4, may suggest aneurysm formation.  Normal QT interval.   EKG 05/05/2019 at 4:48 AM: Sinus tachycardia heart rate of 102 bpm, right atrial enlargement, normal axis. 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 Anteroseptal infarct acute. No significant change since prior EKG at 2 AM.  Recommendations:   Patient is here on a four-week follow-up of ischemic myopathy, he is presently doing well, he still wearing his LifeVest.  Blood pressure is stable, no change in his physical exam, continue present medications, his labs are ordered but states that patient had them done at the PCPs office and I'll try to obtain this.  He will need repeat labs 10 days to 2 weeks.  Now that he is on appropriate medical therapy, I'll obtain repeat echocardiogram to evaluate his LV systolic function.  No clinical evidence of heart failure.  I'll increase the dose of intestinal to moderate dose.  I'll see him back in 6 weeks. Smoking cessation again discussed.  He is now smoking 3 cigarettes a day.  Adrian Prows, MD, [facility] 07/06/2019, 3:12 PM [facility]. [place] Pager: [phone] Office: [phone] If no answer Cell [phone]

## 2019-07-07 DIAGNOSIS — I213 ST elevation (STEMI) myocardial infarction of unspecified site: Secondary | ICD-10-CM | POA: Diagnosis not present

## 2019-07-14 ENCOUNTER — Other Ambulatory Visit: Payer: Self-pay | Admitting: Cardiology

## 2019-07-14 DIAGNOSIS — I252 Old myocardial infarction: Secondary | ICD-10-CM

## 2019-07-14 DIAGNOSIS — I2510 Atherosclerotic heart disease of native coronary artery without angina pectoris: Secondary | ICD-10-CM

## 2019-07-16 ENCOUNTER — Other Ambulatory Visit: Payer: Self-pay

## 2019-07-16 DIAGNOSIS — E7800 Pure hypercholesterolemia, unspecified: Secondary | ICD-10-CM

## 2019-07-16 MED ORDER — ATORVASTATIN CALCIUM 80 MG PO TABS: 80.0000 mg | ORAL_TABLET | Freq: Every day | ORAL | 2 refills | Status: DC

## 2019-07-20 ENCOUNTER — Other Ambulatory Visit: Payer: Self-pay

## 2019-07-20 ENCOUNTER — Ambulatory Visit (INDEPENDENT_AMBULATORY_CARE_PROVIDER_SITE_OTHER): Payer: Medicare Other

## 2019-07-20 DIAGNOSIS — I2510 Atherosclerotic heart disease of native coronary artery without angina pectoris: Secondary | ICD-10-CM

## 2019-07-20 DIAGNOSIS — I5042 Chronic combined systolic (congestive) and diastolic (congestive) heart failure: Secondary | ICD-10-CM

## 2019-07-27 DIAGNOSIS — J449 Chronic obstructive pulmonary disease, unspecified: Secondary | ICD-10-CM | POA: Diagnosis not present

## 2019-07-27 DIAGNOSIS — E1151 Type 2 diabetes mellitus with diabetic peripheral angiopathy without gangrene: Secondary | ICD-10-CM | POA: Diagnosis not present

## 2019-07-27 DIAGNOSIS — E1129 Type 2 diabetes mellitus with other diabetic kidney complication: Secondary | ICD-10-CM | POA: Diagnosis not present

## 2019-07-29 DIAGNOSIS — J449 Chronic obstructive pulmonary disease, unspecified: Secondary | ICD-10-CM | POA: Diagnosis not present

## 2019-07-29 DIAGNOSIS — C3490 Malignant neoplasm of unspecified part of unspecified bronchus or lung: Secondary | ICD-10-CM | POA: Diagnosis not present

## 2019-07-30 ENCOUNTER — Telehealth: Payer: Self-pay

## 2019-07-30 ENCOUNTER — Other Ambulatory Visit: Payer: Self-pay

## 2019-07-30 DIAGNOSIS — I10 Essential (primary) hypertension: Secondary | ICD-10-CM

## 2019-07-30 DIAGNOSIS — I5042 Chronic combined systolic (congestive) and diastolic (congestive) heart failure: Secondary | ICD-10-CM

## 2019-07-30 DIAGNOSIS — I252 Old myocardial infarction: Secondary | ICD-10-CM

## 2019-07-30 DIAGNOSIS — I2510 Atherosclerotic heart disease of native coronary artery without angina pectoris: Secondary | ICD-10-CM

## 2019-07-30 DIAGNOSIS — E7800 Pure hypercholesterolemia, unspecified: Secondary | ICD-10-CM

## 2019-07-30 DIAGNOSIS — I255 Ischemic cardiomyopathy: Secondary | ICD-10-CM

## 2019-07-30 MED ORDER — ENTRESTO 49-51 MG PO TABS: 1.0000 | ORAL_TABLET | Freq: Two times a day (BID) | ORAL | 2 refills | Status: DC

## 2019-07-30 MED ORDER — TICAGRELOR 90 MG PO TABS: 90.0000 mg | ORAL_TABLET | Freq: Two times a day (BID) | ORAL | 1 refills | Status: DC

## 2019-07-30 MED ORDER — METOPROLOL SUCCINATE ER 100 MG PO TB24: 100.0000 mg | ORAL_TABLET | Freq: Two times a day (BID) | ORAL | 1 refills | Status: DC

## 2019-07-30 MED ORDER — ATORVASTATIN CALCIUM 80 MG PO TABS: 80.0000 mg | ORAL_TABLET | Freq: Every day | ORAL | 2 refills | Status: DC

## 2019-07-30 MED ORDER — NICOTINE 14 MG/24HR TD PT24: 14.0000 mg | MEDICATED_PATCH | Freq: Every day | TRANSDERMAL | 3 refills | Status: DC

## 2019-07-30 NOTE — Telephone Encounter (Signed)
Patient calling for echo results, please advise.

## 2019-07-30 NOTE — Telephone Encounter (Signed)
Echocardiogram 07/20/2019: Left ventricle cavity is normal in size. Mild concentric hypertrophy of the left ventricle. Moderate global hypokinesis with distal antero-apical, apical dyskinesis c/w prior myocardial infarction. Severely depressed LV systolic function with visual EF 25-30%. Indeterminate diastolic filling pattern.  No significant valvular abnormality. Normal right atrial pressure. No significant change compared to previous study on 05/05/2019.   Severe decrease in heart function, unchanged from before. Will discuss more on visit

## 2019-08-07 DIAGNOSIS — I213 ST elevation (STEMI) myocardial infarction of unspecified site: Secondary | ICD-10-CM | POA: Diagnosis not present

## 2019-08-17 ENCOUNTER — Ambulatory Visit: Payer: Medicare Other | Admitting: Cardiology

## 2019-08-20 ENCOUNTER — Encounter: Payer: Self-pay | Admitting: Cardiology

## 2019-08-20 ENCOUNTER — Other Ambulatory Visit: Payer: Self-pay

## 2019-08-20 ENCOUNTER — Ambulatory Visit (INDEPENDENT_AMBULATORY_CARE_PROVIDER_SITE_OTHER): Payer: Medicare Other | Admitting: Cardiology

## 2019-08-20 VITALS — BP 117/82 | HR 90 | Ht 71.0 in | Wt 154.9 lb

## 2019-08-20 DIAGNOSIS — I255 Ischemic cardiomyopathy: Secondary | ICD-10-CM | POA: Diagnosis not present

## 2019-08-20 DIAGNOSIS — I5042 Chronic combined systolic (congestive) and diastolic (congestive) heart failure: Secondary | ICD-10-CM | POA: Diagnosis not present

## 2019-08-20 DIAGNOSIS — I2510 Atherosclerotic heart disease of native coronary artery without angina pectoris: Secondary | ICD-10-CM

## 2019-08-20 DIAGNOSIS — F17209 Nicotine dependence, unspecified, with unspecified nicotine-induced disorders: Secondary | ICD-10-CM

## 2019-08-20 DIAGNOSIS — R5383 Other fatigue: Secondary | ICD-10-CM | POA: Diagnosis not present

## 2019-08-20 DIAGNOSIS — Z6822 Body mass index (BMI) 22.0-22.9, adult: Secondary | ICD-10-CM | POA: Diagnosis not present

## 2019-08-20 DIAGNOSIS — K5090 Crohn's disease, unspecified, without complications: Secondary | ICD-10-CM | POA: Diagnosis not present

## 2019-08-20 NOTE — Progress Notes (Signed)
Primary Physician/Referring:  Maryella Shivers, MD  Patient ID: Jimmy Mort., male    DOB: 1966/07/29, 53 y.o.   MRN: [id number]  Chief Complaint  Patient presents with  . Congestive Heart Failure  . Coronary Artery Disease   HPI:    HPI: Jimmy Tamura Sr.  is a 53 y.o. Caucasian male with lung cancer in remission, ongoing tobacco use disorder, COPD, hypertension, admitted with acute pulmonary and underwent successful thrombectomy followed by stenting to proximal and mid LAD on 05/05/2019.  LVEF 25 to 30% and had NSVT on telemetry.    He is now here on 2 month office visit and to discuss recent echocardiogram. No chest pain. Has chronic dyspnea on exertion that is stable. He is tolerating Entresto and high dose BB without dizziness or worsening dyspnea.   States that he wants to live and states that he has made a lot of lifestyle changes including diet changes. Now smoking only 3 cigarettes a day and having difficulty in quitting smoking.  No leg edema, dizziness or syncope.  Past Medical History:  Diagnosis Date  . Amnesia   . Anemia   . COPD (chronic obstructive pulmonary disease) ([place])   . Crohn's disease ([place]) Deteriorating disk  . Depression   . History of MI (myocardial infarction) 05/05/2019  . History of stroke    2  . Hyperlipidemia   . Hypertension   . Lung cancer ([place])   . Mental disorder   . Sleep apnea   . Stroke [facility])    Past Surgical History:  Procedure Laterality Date  . APPENDECTOMY    . CORONARY/GRAFT ACUTE MI REVASCULARIZATION N/A 05/05/2019   Procedure: Coronary/Graft Acute MI Revascularization;  Surgeon: Adrian Prows, MD;  Location: [place] [facility];  Service: Cardiovascular;  Laterality: N/A;  . EYE SURGERY    . intestestine for chrohns disease    . LEFT HEART CATH AND CORONARY ANGIOGRAPHY N/A 05/05/2019   Procedure: LEFT HEART CATH AND CORONARY ANGIOGRAPHY;  Surgeon: Adrian Prows, MD;  Location: [place] [facility];  Service:  Cardiovascular;  Laterality: N/A;  . PORT-A-CATH REMOVAL     Social History   Socioeconomic History  . Marital status: Legally Separated    Spouse name: Not on file  . Number of children: 5  . Years of education: Not on file  . Highest education level: Not on file  Occupational History  . Not on file  Social Needs  . Financial resource strain: Hard  . Food insecurity    Worry: Sometimes true    Inability: Sometimes true  . Transportation needs    Medical: No    Non-medical: Yes  Tobacco Use  . Smoking status: Current Every Day Smoker    Types: Cigarettes  . Smokeless tobacco: Current User    Types: Snuff  . Tobacco comment: 3 cigarettes daily   Substance and Sexual Activity  . Alcohol use: Not Currently  . Drug use: No  . Sexual activity: Yes  Lifestyle  . Physical activity    Days per week: Not on file    Minutes per session: Not on file  . Stress: Not on file  Relationships  . Social Herbalist on phone: Not on file    Gets together: Not on file    Attends religious service: Not on file    Active member of club or organization: Not on file    Attends meetings of clubs or organizations: Not on file  Relationship status: Not on file  . Intimate partner violence    Fear of current or ex partner: Not on file    Emotionally abused: Not on file    Physically abused: Not on file    Forced sexual activity: Not on file  Other Topics Concern  . Not on file  Social History Narrative  . Not on file   ROS  Review of Systems  Constitution: Negative for chills, decreased appetite, malaise/fatigue and weight gain.  Cardiovascular: Negative for chest pain, dyspnea on exertion, leg swelling and syncope.  Respiratory: Positive for shortness of breath.   Endocrine: Negative for cold intolerance.  Hematologic/Lymphatic: Does not bruise/bleed easily.  Musculoskeletal: Negative for joint swelling.  Gastrointestinal: Negative for abdominal pain, anorexia, change in  bowel habit, hematochezia and melena.  Neurological: Negative for headaches and light-headedness.  Psychiatric/Behavioral: Positive for depression. Negative for substance abuse. The patient is nervous/anxious.   All other systems reviewed and are negative.  Objective  Blood pressure 117/82, pulse 90, height 5' 11"  (1.803 m), weight 154 lb 14.4 oz (70.3 kg), SpO2 100 %. Body mass index is 21.6 kg/m.   Physical Exam  Constitutional: He is oriented to person, place, and time. He appears well-nourished. He appears cachectic. No distress.  Appears older than stated age  HENT:  Head: Atraumatic.  Eyes: Conjunctivae are normal.  Neck: Neck supple. No JVD present. No thyromegaly present.  Cardiovascular: Normal rate, regular rhythm, intact distal pulses and normal pulses. Exam reveals distant heart sounds. Exam reveals no gallop.  No murmur heard. Intact distal pulse. No leg edema. No JVD.    Pulmonary/Chest: Effort normal. Tachypnea noted. He has no decreased breath sounds.  Chest barrel shaped. Prolonged expiration.  Abdominal: Soft. Bowel sounds are normal.  Musculoskeletal: Normal range of motion.  Neurological: He is alert and oriented to person, place, and time.  Skin: Skin is warm and dry.  Psychiatric: His mood appears anxious.  Vitals reviewed.  Radiology: No results found.  Laboratory examination:   Recent Labs    05/05/19 0312 05/05/19 0541 05/06/19 0246 05/25/19 1049  NA 137  --  140 139  K 2.9*  --  3.0* 3.1*  CL 100  --  101 106  CO2 24  --  24 25  GLUCOSE 157*  --  170* 93  BUN 8  --  13 10  CREATININE 1.10 1.14 1.25* 1.58*  CALCIUM 9.0  --  9.4 8.5*  GFRNONAA >60 >60 >60 49*  GFRAA >60 >60 >60 57*   CMP Latest Ref Rng & Units 05/25/2019 05/06/2019 05/05/2019  Glucose 70 - 99 mg/dL 93 170(H) -  BUN 6 - 20 mg/dL 10 13 -  Creatinine 0.61 - 1.24 mg/dL 1.58(H) 1.25(H) 1.14  Sodium 135 - 145 mmol/L 139 140 -  Potassium 3.5 - 5.1 mmol/L 3.1(L) 3.0(L) -  Chloride  98 - 111 mmol/L 106 101 -  CO2 22 - 32 mmol/L 25 24 -  Calcium 8.9 - 10.3 mg/dL 8.5(L) 9.4 -  Total Protein 6.5 - 8.1 g/dL 6.2(L) - -  Total Bilirubin 0.3 - 1.2 mg/dL 0.5 - -  Alkaline Phos 38 - 126 U/L 61 - -  AST 15 - 41 U/L 21 - -  ALT 0 - 44 U/L 24 - -   CBC Latest Ref Rng & Units 05/25/2019 05/06/2019 05/05/2019  WBC 4.0 - 10.5 K/uL 12.3(H) 20.6(H) 10.9(H)  Hemoglobin 13.0 - 17.0 g/dL 13.5 16.4 15.6  Hematocrit 39.0 - 52.0 %  42.8 51.2 47.3  Platelets 150 - 400 K/uL 276 346 286   Lipid Panel     Component Value Date/Time   CHOL 117 05/05/2019 0312   TRIG 141 05/05/2019 0312   HDL 33 (L) 05/05/2019 0312   CHOLHDL 3.5 05/05/2019 0312   VLDL 28 05/05/2019 0312   LDLCALC 56 05/05/2019 0312   HEMOGLOBIN A1C Lab Results  Component Value Date   HGBA1C 5.4 05/05/2019   MPG 108.28 05/05/2019   TSH No results for input(s): TSH in the last 8760 hours. Medications   Current Outpatient Medications  Medication Instructions  . albuterol (PROVENTIL HFA;VENTOLIN HFA) 108 (90 BASE) MCG/ACT inhaler 2 puffs, Inhalation, Every 4 hours PRN  . aspirin 81 mg, Oral, Daily  . atorvastatin (LIPITOR) 80 mg, Oral, Daily-1800  . divalproex (DEPAKOTE) 250-500 mg, Oral, See admin instructions, Tale 250 mg in the morning and 500 mg in the evening  . docusate sodium (COLACE) 100 mg, Oral, 2 times daily  . ferrous sulfate 325 mg, Oral, Daily with breakfast  . gabapentin (NEURONTIN) 600 mg, Oral, 3 times daily  . hydrOXYzine (ATARAX/VISTARIL) 25 mg, Oral, Every 8 hours PRN  . ipratropium-albuterol (DUONEB) 0.5-2.5 (3) MG/3ML SOLN 3 mLs, Inhalation, As needed  . metoprolol succinate (TOPROL-XL) 100 mg, Oral, 2 times daily, Take with or immediately following a meal.  . montelukast (SINGULAIR) 10 mg, Oral, Daily  . Multiple Vitamins-Minerals (MULTIVITAMIN WITH MINERALS) tablet 1 tablet, Oral, Daily  . nicotine (NICODERM CQ - DOSED IN MG/24 HOURS) 14 mg, Transdermal, Daily  . nitroGLYCERIN (NITROSTAT) 0.4  mg, Sublingual, Every 5 min PRN  . pyridOXINE (VITAMIN B-6) 100 mg, Oral, Daily  . sacubitril-valsartan (ENTRESTO) 49-51 MG 1 tablet, Oral, 2 times daily  . SYMBICORT 160-4.5 MCG/ACT inhaler 1 puff, Inhalation, 2 times daily  . ticagrelor (BRILINTA) 90 mg, Oral, 2 times daily  . traZODone (DESYREL) 100 mg, Oral, At bedtime PRN  . vitamin B-12 (CYANOCOBALAMIN) 1,000 mcg, Oral, Daily    Cardiac Studies:   Echocardiogram 07/20/2019: Left ventricle cavity is normal in size. Mild concentric hypertrophy of the left ventricle. Moderate global hypokinesis with distal antero-apical, apical dyskinesis c/w prior myocardial infarction. Severely depressed LV systolic function with visual EF 25-30%. Indeterminate diastolic filling pattern.  No significant valvular abnormality. Normal right atrial pressure. No significant change compared to previous study on 05/05/2019.   Coronary Angiography 05/05/2019:Prox LAD to Mid LAD lesion is 100% stenosed S/P aspiration thrombectomy followed by overlapping 3.5 x 20 and a 3.0 x 12 mm Synergy DES distally, stenosis reduced from 10 percent to 0%. D1 has ostial 20 to 30% stenosis. Circumflex: Mild disease in the proximal and, OM 2 is very tiny and severely diseased. RCA is dominant, proximal 50% stenosis. LVEDP markedly elevated at 27 mmHg. EF 10 to 15% with anterolateral akinesis. 110 mL contrast utilized, 6.9 minutes of fluoroscopy time. Recommendation: Patient will be kept in the intensive care unit, he may need a hospital admission for 48 hours or more in view of severe LV dysfunction unless he does well clinically. I discussed the findings with his wife on the telephone.  Echocardiogram 05/17/2019: 1. The left ventricle has severely reduced systolic function, with an ejection fraction of 25-30%. The cavity size was normal. Left ventricular diastolic function could not be evaluated. Left ventricular diffuse hypokinesis. 2. The right ventricle has normal  systolic function. The cavity was normal. There is no increase in right ventricular wall thickness. 3. Trivial pericardial effusion is present. 4. The aorta  is normal in size and structure. 5. The aortic root is normal in size and structure.  Assessment     ICD-10-CM   1. Ischemic cardiomyopathy  I25.5   2. Coronary artery disease involving native coronary artery of native heart without angina pectoris  I25.10   3. Chronic combined systolic and diastolic heart failure (HCC)  I50.42   4. Tobacco use disorder, continuous  F17.209      EKG 05/11/2019: Sinus tachycardia at rate of 105 bpm, normal axis, anteroseptal infarct old.  Persistent ST elevation in V1 to V4, may suggest aneurysm formation.  Normal QT interval.   EKG 05/05/2019 at 4:48 AM: Sinus tachycardia heart rate of 102 bpm, right atrial enlargement, normal axis. 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 Anteroseptal infarct acute. No significant change since prior EKG at 2 AM.  Recommendations:   Patient is here on a 3 month office visit and follow up for CAD and ischemic cardiomyopathy. No symptoms of angina. Has chronic shortness of breath that has been stable. No evidence of decompensated heart failure. I have reviewed recent echocardiogram findings, he continues to have depressed LVEF at 25%. He is on moderate dose of Entresto. His previous kidney function had slightly worsened from his baseline in August. Will need to follow up on this. He is to see his PCP today, will potentially have labs at his appt. If not, he is advised to contact me and I will plan to order. If kidney function will allow, will try to increase his Entresto to max dose. Otherwise, he is on appropriate medical therapy and has continued to have depressed EF. Will make referral to EP for possible ICD implantation given ischemic cardiomyopathy.  He has been able to cut back to 4-5 cigarettes per day and is working to completely quit. I have congratulated him on this and stressed  the importance of complete smoking cessation. I will plan to see him back in 2 months for follow up on CAD and ischemic cardiomyopathy.    Miquel Dunn, MSN, APRN, FNP-C [facility]. [place] Office: [phone] Fax: [phone]

## 2019-08-22 ENCOUNTER — Ambulatory Visit (INDEPENDENT_AMBULATORY_CARE_PROVIDER_SITE_OTHER): Payer: Medicare Other | Admitting: Internal Medicine

## 2019-08-22 ENCOUNTER — Encounter: Payer: Self-pay | Admitting: Internal Medicine

## 2019-08-22 ENCOUNTER — Other Ambulatory Visit: Payer: Self-pay

## 2019-08-22 DIAGNOSIS — R5383 Other fatigue: Secondary | ICD-10-CM | POA: Diagnosis not present

## 2019-08-22 DIAGNOSIS — I428 Other cardiomyopathies: Secondary | ICD-10-CM

## 2019-08-22 HISTORY — DX: Other cardiomyopathies: I42.8

## 2019-08-22 NOTE — Progress Notes (Signed)
HPI Jimmy Nelson is referred today by Dr. Wonda Amis to discuss ICD insertion. He has a longstanding ICM, s/p MI s/p multiple stents in the past, most recently in August. He has oxygen dependent COPD. The patient has not had syncope. He has an EF of 25% by echo. 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 He has been dizzy and lightheaded. Allergies  Allergen Reactions  . Iodinated Diagnostic Agents     It is noted that pt is allergic to Iodinated contrast media-iv dye,oral contrast     Current Outpatient Medications  Medication Sig Dispense Refill  . albuterol (PROVENTIL HFA;VENTOLIN HFA) 108 (90 BASE) MCG/ACT inhaler Inhale 2 puffs into the lungs every 4 (four) hours as needed for wheezing.    Marland Kitchen aspirin 81 MG chewable tablet Chew 81 mg by mouth daily.    Marland Kitchen atorvastatin (LIPITOR) 80 MG tablet Take 1 tablet (80 mg total) by mouth daily at 6 PM. 90 tablet 2  . divalproex (DEPAKOTE) 250 MG DR tablet Take 250-500 mg by mouth See admin instructions. Tale 250 mg in the morning and 500 mg in the evening    . docusate sodium (COLACE) 100 MG capsule Take 100 mg by mouth 2 (two) times daily.    . ferrous sulfate 325 (65 FE) MG tablet Take 1 tablet (325 mg total) by mouth daily with breakfast. 30 tablet 0  . gabapentin (NEURONTIN) 600 MG tablet Take 600 mg by mouth 3 (three) times daily.    . hydrOXYzine (ATARAX/VISTARIL) 25 MG tablet Take 1 tablet (25 mg total) by mouth every 8 (eight) hours as needed for anxiety. (Patient taking differently: Take 25 mg by mouth every 6 (six) hours as needed for anxiety. ) 8 tablet 0  . ipratropium-albuterol (DUONEB) 0.5-2.5 (3) MG/3ML SOLN Inhale 3 mLs into the lungs as needed.    . metoprolol succinate (TOPROL-XL) 100 MG 24 hr tablet Take 1 tablet (100 mg total) by mouth 2 (two) times daily. Take with or immediately following a meal. 180 tablet 1  . montelukast (SINGULAIR) 10 MG tablet Take 10 mg by mouth daily.    . Multiple Vitamins-Minerals (MULTIVITAMIN WITH MINERALS) tablet Take 1  tablet by mouth daily. 30 tablet 0  . nicotine (NICODERM CQ - DOSED IN MG/24 HOURS) 14 mg/24hr patch Place 1 patch (14 mg total) onto the skin daily. 28 patch 3  . pyridOXINE (VITAMIN B-6) 100 MG tablet Take 100 mg by mouth daily.    . sacubitril-valsartan (ENTRESTO) 49-51 MG Take 1 tablet by mouth 2 (two) times daily. 60 tablet 2  . SYMBICORT 160-4.5 MCG/ACT inhaler Inhale 1 puff into the lungs 2 (two) times daily.    . ticagrelor (BRILINTA) 90 MG TABS tablet Take 1 tablet (90 mg total) by mouth 2 (two) times daily. 180 tablet 1  . traZODone (DESYREL) 100 MG tablet Take 100 mg by mouth at bedtime as needed for sleep.    . vitamin B-12 (CYANOCOBALAMIN) 1000 MCG tablet Take 1,000 mcg by mouth daily.    . nitroGLYCERIN (NITROSTAT) 0.4 MG SL tablet Place 1 tablet (0.4 mg total) under the tongue every 5 (five) minutes as needed for up to 25 days for chest pain. 25 tablet 3   No current facility-administered medications for this visit.      Past Medical History:  Diagnosis Date  . Amnesia   . Anemia   . COPD (chronic obstructive pulmonary disease) ([place])   . Crohn's disease ([place]) Deteriorating disk  .  Depression   . History of MI (myocardial infarction) 05/05/2019  . History of stroke    2  . Hyperlipidemia   . Hypertension   . Lung cancer ([place])   . Mental disorder   . Sleep apnea   . Stroke ([place])     ROS:   All systems reviewed and negative except as noted in the HPI.   Past Surgical History:  Procedure Laterality Date  . APPENDECTOMY    . CORONARY/GRAFT ACUTE MI REVASCULARIZATION N/A 05/05/2019   Procedure: Coronary/Graft Acute MI Revascularization;  Surgeon: Adrian Prows, MD;  Location: [place] [facility];  Service: Cardiovascular;  Laterality: N/A;  . EYE SURGERY    . intestestine for chrohns disease    . LEFT HEART CATH AND CORONARY ANGIOGRAPHY N/A 05/05/2019   Procedure: LEFT HEART CATH AND CORONARY ANGIOGRAPHY;  Surgeon: Adrian Prows, MD;  Location: [place] [facility];  Service:  Cardiovascular;  Laterality: N/A;  . PORT-A-CATH REMOVAL       No family history on file.   Social History   Socioeconomic History  . Marital status: Legally Separated    Spouse name: Not on file  . Number of children: 5  . Years of education: Not on file  . Highest education level: Not on file  Occupational History  . Not on file  Social Needs  . Financial resource strain: Hard  . Food insecurity    Worry: Sometimes true    Inability: Sometimes true  . Transportation needs    Medical: No    Non-medical: Yes  Tobacco Use  . Smoking status: Current Every Day Smoker    Types: Cigarettes  . Smokeless tobacco: Current User    Types: Snuff  . Tobacco comment: 3 cigarettes daily   Substance and Sexual Activity  . Alcohol use: Not Currently  . Drug use: No  . Sexual activity: Yes  Lifestyle  . Physical activity    Days per week: Not on file    Minutes per session: Not on file  . Stress: Not on file  Relationships  . Social Herbalist on phone: Not on file    Gets together: Not on file    Attends religious service: Not on file    Active member of club or organization: Not on file    Attends meetings of clubs or organizations: Not on file    Relationship status: Not on file  . Intimate partner violence    Fear of current or ex partner: Not on file    Emotionally abused: Not on file    Physically abused: Not on file    Forced sexual activity: Not on file  Other Topics Concern  . Not on file  Social History Narrative  . Not on file     BP (!) 80/60   Pulse 76   Ht 5' 11.5" (1.816 m)   Wt 152 lb 9.6 oz (69.2 kg)   SpO2 99%   BMI 20.99 kg/m   Physical Exam:  diskempt appearing NAD HEENT: Unremarkable Neck:  No JVD, no thyromegally Lymphatics:  No adenopathy Back:  No CVA tenderness Lungs:  Clear with no wheezes HEART:  Regular rate rhythm, no murmurs, no rubs, no clicks Abd:  soft, positive bowel sounds, no organomegally, no rebound, no  guarding Ext:  2 plus pulses, no edema, no cyanosis, no clubbing Skin:  No rashes no nodules Neuro:  CN II through XII intact, motor grossly intact  EKG - NSR  with septal MI   Assess/Plan: 1. ICM - he is s/p MI. He denies anginal symptoms. He will continue his current meds.  2. Chronic systolic heart failure - he has class 2 symptoms and is on maximal medical therapy. His bp is actually low and I suspect he will need to reduce his meds. I have discussed the indications for ICD insertion and he wishes to proceed. 3. Tobacco abuse - he has gone from over 2 packs a day down to less than a half of a pack. I encouraged him to stop.   Mikle Bosworth.D.

## 2019-08-22 NOTE — H&P (View-Only) (Signed)
HPI Jimmy Nelson is referred today by Dr. Wonda Amis to discuss ICD insertion. He has a longstanding ICM, s/p MI s/p multiple stents in the past, most recently in August. He has oxygen dependent COPD. The patient has not had syncope. He has an EF of 25% by echo. 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 He has been dizzy and lightheaded. Allergies  Allergen Reactions  . Iodinated Diagnostic Agents     It is noted that pt is allergic to Iodinated contrast media-iv dye,oral contrast     Current Outpatient Medications  Medication Sig Dispense Refill  . albuterol (PROVENTIL HFA;VENTOLIN HFA) 108 (90 BASE) MCG/ACT inhaler Inhale 2 puffs into the lungs every 4 (four) hours as needed for wheezing.    Marland Kitchen aspirin 81 MG chewable tablet Chew 81 mg by mouth daily.    Marland Kitchen atorvastatin (LIPITOR) 80 MG tablet Take 1 tablet (80 mg total) by mouth daily at 6 PM. 90 tablet 2  . divalproex (DEPAKOTE) 250 MG DR tablet Take 250-500 mg by mouth See admin instructions. Tale 250 mg in the morning and 500 mg in the evening    . docusate sodium (COLACE) 100 MG capsule Take 100 mg by mouth 2 (two) times daily.    . ferrous sulfate 325 (65 FE) MG tablet Take 1 tablet (325 mg total) by mouth daily with breakfast. 30 tablet 0  . gabapentin (NEURONTIN) 600 MG tablet Take 600 mg by mouth 3 (three) times daily.    . hydrOXYzine (ATARAX/VISTARIL) 25 MG tablet Take 1 tablet (25 mg total) by mouth every 8 (eight) hours as needed for anxiety. (Patient taking differently: Take 25 mg by mouth every 6 (six) hours as needed for anxiety. ) 8 tablet 0  . ipratropium-albuterol (DUONEB) 0.5-2.5 (3) MG/3ML SOLN Inhale 3 mLs into the lungs as needed.    . metoprolol succinate (TOPROL-XL) 100 MG 24 hr tablet Take 1 tablet (100 mg total) by mouth 2 (two) times daily. Take with or immediately following a meal. 180 tablet 1  . montelukast (SINGULAIR) 10 MG tablet Take 10 mg by mouth daily.    . Multiple Vitamins-Minerals (MULTIVITAMIN WITH MINERALS) tablet Take 1  tablet by mouth daily. 30 tablet 0  . nicotine (NICODERM CQ - DOSED IN MG/24 HOURS) 14 mg/24hr patch Place 1 patch (14 mg total) onto the skin daily. 28 patch 3  . pyridOXINE (VITAMIN B-6) 100 MG tablet Take 100 mg by mouth daily.    . sacubitril-valsartan (ENTRESTO) 49-51 MG Take 1 tablet by mouth 2 (two) times daily. 60 tablet 2  . SYMBICORT 160-4.5 MCG/ACT inhaler Inhale 1 puff into the lungs 2 (two) times daily.    . ticagrelor (BRILINTA) 90 MG TABS tablet Take 1 tablet (90 mg total) by mouth 2 (two) times daily. 180 tablet 1  . traZODone (DESYREL) 100 MG tablet Take 100 mg by mouth at bedtime as needed for sleep.    . vitamin B-12 (CYANOCOBALAMIN) 1000 MCG tablet Take 1,000 mcg by mouth daily.    . nitroGLYCERIN (NITROSTAT) 0.4 MG SL tablet Place 1 tablet (0.4 mg total) under the tongue every 5 (five) minutes as needed for up to 25 days for chest pain. 25 tablet 3   No current facility-administered medications for this visit.      Past Medical History:  Diagnosis Date  . Amnesia   . Anemia   . COPD (chronic obstructive pulmonary disease) ([place])   . Crohn's disease ([place]) Deteriorating disk  .  Depression   . History of MI (myocardial infarction) 05/05/2019  . History of stroke    2  . Hyperlipidemia   . Hypertension   . Lung cancer ([place])   . Mental disorder   . Sleep apnea   . Stroke ([place])     ROS:   All systems reviewed and negative except as noted in the HPI.   Past Surgical History:  Procedure Laterality Date  . APPENDECTOMY    . CORONARY/GRAFT ACUTE MI REVASCULARIZATION N/A 05/05/2019   Procedure: Coronary/Graft Acute MI Revascularization;  Surgeon: Adrian Prows, MD;  Location: [facility];  Service: Cardiovascular;  Laterality: N/A;  . EYE SURGERY    . intestestine for chrohns disease    . LEFT HEART CATH AND CORONARY ANGIOGRAPHY N/A 05/05/2019   Procedure: LEFT HEART CATH AND CORONARY ANGIOGRAPHY;  Surgeon: Adrian Prows, MD;  Location: [facility];  Service:  Cardiovascular;  Laterality: N/A;  . PORT-A-CATH REMOVAL       No family history on file.   Social History   Socioeconomic History  . Marital status: Legally Separated    Spouse name: Not on file  . Number of children: 5  . Years of education: Not on file  . Highest education level: Not on file  Occupational History  . Not on file  Social Needs  . Financial resource strain: Hard  . Food insecurity    Worry: Sometimes true    Inability: Sometimes true  . Transportation needs    Medical: No    Non-medical: Yes  Tobacco Use  . Smoking status: Current Every Day Smoker    Types: Cigarettes  . Smokeless tobacco: Current User    Types: Snuff  . Tobacco comment: 3 cigarettes daily   Substance and Sexual Activity  . Alcohol use: Not Currently  . Drug use: No  . Sexual activity: Yes  Lifestyle  . Physical activity    Days per week: Not on file    Minutes per session: Not on file  . Stress: Not on file  Relationships  . Social Herbalist on phone: Not on file    Gets together: Not on file    Attends religious service: Not on file    Active member of club or organization: Not on file    Attends meetings of clubs or organizations: Not on file    Relationship status: Not on file  . Intimate partner violence    Fear of current or ex partner: Not on file    Emotionally abused: Not on file    Physically abused: Not on file    Forced sexual activity: Not on file  Other Topics Concern  . Not on file  Social History Narrative  . Not on file     BP (!) 80/60   Pulse 76   Ht 5' 11.5" (1.816 m)   Wt 152 lb 9.6 oz (69.2 kg)   SpO2 99%   BMI 20.99 kg/m   Physical Exam:  diskempt appearing NAD HEENT: Unremarkable Neck:  No JVD, no thyromegally Lymphatics:  No adenopathy Back:  No CVA tenderness Lungs:  Clear with no wheezes HEART:  Regular rate rhythm, no murmurs, no rubs, no clicks Abd:  soft, positive bowel sounds, no organomegally, no rebound, no  guarding Ext:  2 plus pulses, no edema, no cyanosis, no clubbing Skin:  No rashes no nodules Neuro:  CN II through XII intact, motor grossly intact  EKG - NSR  with septal MI   Assess/Plan: 1. ICM - he is s/p MI. He denies anginal symptoms. He will continue his current meds.  2. Chronic systolic heart failure - he has class 2 symptoms and is on maximal medical therapy. His bp is actually low and I suspect he will need to reduce his meds. I have discussed the indications for ICD insertion and he wishes to proceed. 3. Tobacco abuse - he has gone from over 2 packs a day down to less than a half of a pack. I encouraged him to stop.   Jimmy Nelson.D.

## 2019-08-22 NOTE — Patient Instructions (Addendum)
Medication Instructions:  Your physician recommends that you continue on your current medications as directed. Please refer to the Current Medication list given to you today.  Labwork: You will get lab work today:  BMP and CBC  Testing/Procedures: Your physician has recommended that you have a defibrillator inserted. An implantable cardioverter defibrillator (ICD) is a small device that is placed in your chest or, in rare cases, your abdomen. This device uses electrical pulses or shocks to help control life-threatening, irregular heartbeats that could lead the heart to suddenly stop beating (sudden cardiac arrest). Leads are attached to the ICD that goes into your heart. This is done in the hospital and usually requires an overnight stay. Please see the instruction sheet given to you today for more information.   Follow-Up:  SEE INSTRUCTION LETTER  Any Other Special Instructions Will Be Listed Below (If Applicable).  If you need a refill on your cardiac medications before your next appointment, please call your pharmacy.    Cardioverter Defibrillator Implantation  An implantable cardioverter defibrillator (ICD) is a small device that is placed under the skin in the chest or abdomen. An ICD consists of a battery, a small computer (pulse generator), and wires (leads) that go into the heart. An ICD is used to detect and correct two types of dangerous irregular heartbeats (arrhythmias):  A rapid heart rhythm (tachycardia).  An arrhythmia in which the lower chambers of the heart (ventricles) contract in an uncoordinated way (fibrillation). When an ICD detects tachycardia, it sends a low-energy shock to the heart to restore the heartbeat to normal (cardioversion). This signal is usually painless. If cardioversion does not work or if the ICD detects fibrillation, it delivers a high-energy shock to the heart (defibrillation) to restart the heart. This shock may feel like a strong jolt in the  chest. Your health care provider may prescribe an ICD if:  You have had an arrhythmia that originated in the ventricles.  Your heart has been damaged by a disease or heart condition. Sometimes, ICDs are programmed to act as a device called a pacemaker. Pacemakers can be used to treat a slow heartbeat (bradycardia) or tachycardia by taking over the heart rate with electrical impulses. Tell a health care provider about:  Any allergies you have.  All medicines you are taking, including vitamins, herbs, eye drops, creams, and over-the-counter medicines.  Any problems you or family members have had with anesthetic medicines.  Any blood disorders you have.  Any surgeries you have had.  Any medical conditions you have.  Whether you are pregnant or may be pregnant. What are the risks? Generally, this is a safe procedure. However, problems may occur, including:  Swelling, bleeding, or bruising.  Infection.  Blood clots.  Damage to other structures or organs, such as nerves, blood vessels, or the heart.  Allergic reactions to medicines used during the procedure. What happens before the procedure? Staying hydrated Follow instructions from your health care provider about hydration, which may include:  Up to 2 hours before the procedure - you may continue to drink clear liquids, such as water, clear fruit juice, black coffee, and plain tea. Eating and drinking restrictions Follow instructions from your health care provider about eating and drinking, which may include:  8 hours before the procedure - stop eating heavy meals or foods such as meat, fried foods, or fatty foods.  6 hours before the procedure - stop eating light meals or foods, such as toast or cereal.  6 hours before the  procedure - stop drinking milk or drinks that contain milk.  2 hours before the procedure - stop drinking clear liquids. Medicine Ask your health care provider about:  Changing or stopping your  normal medicines. This is important if you take diabetes medicines or blood thinners.  Taking medicines such as aspirin and ibuprofen. These medicines can thin your blood. Do not take these medicines before your procedure if your doctor tells you not to. Tests  You may have blood tests.  You may have a test to check the electrical signals in your heart (electrocardiogram, ECG).  You may have imaging tests, such as a chest X-ray. General instructions  For 24 hours before the procedure, stop using products that contain nicotine or tobacco, such as cigarettes and e-cigarettes. If you need help quitting, ask your health care provider.  Plan to have someone take you home from the hospital or clinic.  You may be asked to shower with a germ-killing soap. What happens during the procedure?  To reduce your risk of infection: ? Your health care team will wash or sanitize their hands. ? Your skin will be washed with soap. ? Hair may be removed from the surgical area.  Small monitors will be put on your body. They will be used to check your heart, blood pressure, and oxygen level.  An IV tube will be inserted into one of your veins.  You will be given one or more of the following: ? A medicine to help you relax (sedative). ? A medicine to numb the area (local anesthetic). ? A medicine to make you fall asleep (general anesthetic).  Leads will be guided through a blood vessel into your heart and attached to your heart muscles. Depending on the ICD, the leads may go into one ventricle or they may go into both ventricles and into an upper chamber of the heart. An X-ray machine (fluoroscope) will be usedto help guide the leads.  A small incision will be made to create a deep pocket under your skin.  The pulse generator will be placed into the pocket.  The ICD will be tested.  The incision will be closed with stitches (sutures), skin glue, or staples.  A bandage (dressing) will be placed  over the incision. This procedure may vary among health care providers and hospitals. What happens after the procedure?  Your blood pressure, heart rate, breathing rate, and blood oxygen level will be monitored often until the medicines you were given have worn off.  A chest X-ray will be taken to check that the ICD is in the right place.  You will need to stay in the hospital for 1-2 days so your health care provider can make sure your ICD is working.  Do not drive for 24 hours if you received a sedative. Ask your health care provider when it is safe for you to drive.  You may be given an identification card explaining that you have an ICD. Summary  An implantable cardioverter defibrillator (ICD) is a small device that is placed under the skin in the chest or abdomen. It is used to detect and correct dangerous irregular heartbeats (arrhythmias).  An ICD consists of a battery, a small computer (pulse generator), and wires (leads) that go into the heart.  When an ICD detects rapid heart rhythm (tachycardia), it sends a low-energy shock to the heart to restore the heartbeat to normal (cardioversion). If cardioversion does not work or if the ICD detects uncoordinated heart contractions (fibrillation), it  delivers a high-energy shock to the heart (defibrillation) to restart the heart.  You will need to stay in the hospital for 1-2 days to make sure your ICD is working. This information is not intended to replace advice given to you by your health care provider. Make sure you discuss any questions you have with your health care provider. Document Released: 06/05/2002 Document Revised: 08/26/2017 Document Reviewed: 09/22/2016 Elsevier Patient Education  2020 [employer].

## 2019-08-23 LAB — BASIC METABOLIC PANEL
BUN/Creatinine Ratio: 9 (ref 9–20)
BUN: 13 mg/dL (ref 6–24)
CO2: 21 mmol/L (ref 20–29)
Calcium: 9.3 mg/dL (ref 8.7–10.2)
Chloride: 109 mmol/L — ABNORMAL HIGH (ref 96–106)
Creatinine, Ser: 1.45 mg/dL — ABNORMAL HIGH (ref 0.76–1.27)
GFR calc Af Amer: 63 mL/min/{1.73_m2} (ref >59)
GFR calc non Af Amer: 55 mL/min/{1.73_m2} — ABNORMAL LOW (ref >59)
Glucose: 64 mg/dL — ABNORMAL LOW (ref 65–99)
Potassium: 3.7 mmol/L (ref 3.5–5.2)
Sodium: 144 mmol/L (ref 134–144)

## 2019-08-23 LAB — CBC WITH DIFFERENTIAL/PLATELET
Basophils Absolute: 0.1 10*3/uL (ref 0.0–0.2)
Basos: 1 %
EOS (ABSOLUTE): 0.1 10*3/uL (ref 0.0–0.4)
Eos: 2 %
Hematocrit: 46.2 % (ref 37.5–51.0)
Hemoglobin: 15.1 g/dL (ref 13.0–17.7)
Immature Grans (Abs): 0 10*3/uL (ref 0.0–0.1)
Immature Granulocytes: 0 %
Lymphocytes Absolute: 1.6 10*3/uL (ref 0.7–3.1)
Lymphs: 20 %
MCH: 28.4 pg (ref 26.6–33.0)
MCHC: 32.7 g/dL (ref 31.5–35.7)
MCV: 87 fL (ref 79–97)
Monocytes Absolute: 1.1 10*3/uL — ABNORMAL HIGH (ref 0.1–0.9)
Monocytes: 13 %
Neutrophils Absolute: 5.1 10*3/uL (ref 1.4–7.0)
Neutrophils: 64 %
Platelets: 310 10*3/uL (ref 150–450)
RBC: 5.31 x10E6/uL (ref 4.14–5.80)
RDW: 13.7 % (ref 11.6–15.4)
WBC: 8.1 10*3/uL (ref 3.4–10.8)

## 2019-08-27 DIAGNOSIS — K5090 Crohn's disease, unspecified, without complications: Secondary | ICD-10-CM | POA: Diagnosis not present

## 2019-08-27 DIAGNOSIS — E1151 Type 2 diabetes mellitus with diabetic peripheral angiopathy without gangrene: Secondary | ICD-10-CM | POA: Diagnosis not present

## 2019-08-27 DIAGNOSIS — E1129 Type 2 diabetes mellitus with other diabetic kidney complication: Secondary | ICD-10-CM | POA: Diagnosis not present

## 2019-08-27 DIAGNOSIS — J449 Chronic obstructive pulmonary disease, unspecified: Secondary | ICD-10-CM | POA: Diagnosis not present

## 2019-08-27 NOTE — Addendum Note (Signed)
Addended by: Rose Phi on: 08/27/2019 04:35 PM   Modules accepted: Orders

## 2019-08-28 DIAGNOSIS — C3490 Malignant neoplasm of unspecified part of unspecified bronchus or lung: Secondary | ICD-10-CM | POA: Diagnosis not present

## 2019-08-28 DIAGNOSIS — J449 Chronic obstructive pulmonary disease, unspecified: Secondary | ICD-10-CM | POA: Diagnosis not present

## 2019-09-02 ENCOUNTER — Other Ambulatory Visit: Payer: Self-pay | Admitting: Cardiology

## 2019-09-02 DIAGNOSIS — I10 Essential (primary) hypertension: Secondary | ICD-10-CM

## 2019-09-07 ENCOUNTER — Other Ambulatory Visit (HOSPITAL_COMMUNITY)
Admission: RE | Admit: 2019-09-07 | Discharge: 2019-09-07 | Disposition: A | Discharge status: Home / Self Care | Payer: Medicare Other | Source: Ambulatory Visit | Attending: Internal Medicine | Admitting: Internal Medicine

## 2019-09-07 DIAGNOSIS — Z01812 Encounter for preprocedural laboratory examination: Secondary | ICD-10-CM | POA: Insufficient documentation

## 2019-09-07 DIAGNOSIS — Z20828 Contact with and (suspected) exposure to other viral communicable diseases: Secondary | ICD-10-CM | POA: Insufficient documentation

## 2019-09-07 LAB — SARS CORONAVIRUS 2 (TAT 6-24 HRS): SARS Coronavirus 2: NEGATIVE

## 2019-09-10 ENCOUNTER — Other Ambulatory Visit: Payer: Self-pay

## 2019-09-10 ENCOUNTER — Ambulatory Visit (HOSPITAL_COMMUNITY)
Admission: RE | Disposition: A | Discharge status: Home / Self Care | Payer: Self-pay | Source: Home / Self Care | Attending: Internal Medicine

## 2019-09-10 ENCOUNTER — Ambulatory Visit (HOSPITAL_COMMUNITY): Payer: Medicare Other

## 2019-09-10 ENCOUNTER — Ambulatory Visit (HOSPITAL_COMMUNITY)
Admission: RE | Admit: 2019-09-10 | Discharge: 2019-09-10 | Disposition: A | Discharge status: Home / Self Care | Payer: Medicare Other | Attending: Internal Medicine | Admitting: Internal Medicine

## 2019-09-10 DIAGNOSIS — Z79899 Other long term (current) drug therapy: Secondary | ICD-10-CM | POA: Insufficient documentation

## 2019-09-10 DIAGNOSIS — F329 Major depressive disorder, single episode, unspecified: Secondary | ICD-10-CM | POA: Diagnosis not present

## 2019-09-10 DIAGNOSIS — Z7982 Long term (current) use of aspirin: Secondary | ICD-10-CM | POA: Diagnosis not present

## 2019-09-10 DIAGNOSIS — G4730 Sleep apnea, unspecified: Secondary | ICD-10-CM | POA: Insufficient documentation

## 2019-09-10 DIAGNOSIS — Z8673 Personal history of transient ischemic attack (TIA), and cerebral infarction without residual deficits: Secondary | ICD-10-CM | POA: Diagnosis not present

## 2019-09-10 DIAGNOSIS — Z7951 Long term (current) use of inhaled steroids: Secondary | ICD-10-CM | POA: Diagnosis not present

## 2019-09-10 DIAGNOSIS — Z888 Allergy status to other drugs, medicaments and biological substances status: Secondary | ICD-10-CM | POA: Insufficient documentation

## 2019-09-10 DIAGNOSIS — E785 Hyperlipidemia, unspecified: Secondary | ICD-10-CM | POA: Diagnosis not present

## 2019-09-10 DIAGNOSIS — Z006 Encounter for examination for normal comparison and control in clinical research program: Secondary | ICD-10-CM | POA: Diagnosis not present

## 2019-09-10 DIAGNOSIS — J449 Chronic obstructive pulmonary disease, unspecified: Secondary | ICD-10-CM | POA: Diagnosis not present

## 2019-09-10 DIAGNOSIS — I255 Ischemic cardiomyopathy: Secondary | ICD-10-CM | POA: Diagnosis not present

## 2019-09-10 DIAGNOSIS — J9811 Atelectasis: Secondary | ICD-10-CM | POA: Diagnosis not present

## 2019-09-10 DIAGNOSIS — I252 Old myocardial infarction: Secondary | ICD-10-CM | POA: Diagnosis not present

## 2019-09-10 DIAGNOSIS — K5090 Crohn's disease, unspecified, without complications: Secondary | ICD-10-CM | POA: Insufficient documentation

## 2019-09-10 DIAGNOSIS — F17210 Nicotine dependence, cigarettes, uncomplicated: Secondary | ICD-10-CM | POA: Diagnosis not present

## 2019-09-10 DIAGNOSIS — I5022 Chronic systolic (congestive) heart failure: Secondary | ICD-10-CM | POA: Insufficient documentation

## 2019-09-10 DIAGNOSIS — Z95810 Presence of automatic (implantable) cardiac defibrillator: Secondary | ICD-10-CM

## 2019-09-10 DIAGNOSIS — I110 Hypertensive heart disease with heart failure: Secondary | ICD-10-CM | POA: Diagnosis not present

## 2019-09-10 DIAGNOSIS — Z85118 Personal history of other malignant neoplasm of bronchus and lung: Secondary | ICD-10-CM | POA: Insufficient documentation

## 2019-09-10 HISTORY — DX: Presence of automatic (implantable) cardiac defibrillator: Z95.810

## 2019-09-10 HISTORY — PX: ICD IMPLANT: EP1208

## 2019-09-10 SURGERY — ICD IMPLANT
Anesthesia: LOCAL

## 2019-09-10 MED ORDER — MIDAZOLAM HCL 5 MG/5ML IJ SOLN
INTRAMUSCULAR | Status: AC
  Filled 2019-09-10: qty 5

## 2019-09-10 MED ORDER — HEPARIN (PORCINE) IN NACL 1000-0.9 UT/500ML-% IV SOLN
INTRAVENOUS | Status: DC | PRN
  Administered 2019-09-10: 500 mL

## 2019-09-10 MED ORDER — SODIUM CHLORIDE 0.9 % IV SOLN
80.0000 mg | INTRAVENOUS | Status: AC
  Administered 2019-09-10: 80 mg

## 2019-09-10 MED ORDER — SODIUM CHLORIDE 0.9 % IV SOLN
INTRAVENOUS | Status: AC
  Filled 2019-09-10: qty 2

## 2019-09-10 MED ORDER — CEFAZOLIN SODIUM-DEXTROSE 2-4 GM/100ML-% IV SOLN
2.0000 g | INTRAVENOUS | Status: AC
  Administered 2019-09-10: 2 g via INTRAVENOUS

## 2019-09-10 MED ORDER — CEFAZOLIN SODIUM-DEXTROSE 2-4 GM/100ML-% IV SOLN
INTRAVENOUS | Status: AC
  Filled 2019-09-10: qty 100

## 2019-09-10 MED ORDER — LIDOCAINE HCL 1 % IJ SOLN
INTRAMUSCULAR | Status: AC
  Filled 2019-09-10: qty 60

## 2019-09-10 MED ORDER — DIPHENHYDRAMINE HCL 50 MG/ML IJ SOLN
25.0000 mg | Freq: Once | INTRAMUSCULAR | Status: AC
  Administered 2019-09-10: 25 mg via INTRAVENOUS
  Filled 2019-09-10: qty 1

## 2019-09-10 MED ORDER — ONDANSETRON HCL 4 MG/2ML IJ SOLN: 4.0000 mg | Freq: Four times a day (QID) | INTRAMUSCULAR | Status: DC | PRN

## 2019-09-10 MED ORDER — FENTANYL CITRATE (PF) 100 MCG/2ML IJ SOLN
INTRAMUSCULAR | Status: AC
  Filled 2019-09-10: qty 2

## 2019-09-10 MED ORDER — SODIUM CHLORIDE 0.9 % IV SOLN
INTRAVENOUS | Status: DC
  Administered 2019-09-10: 10:00:00 via INTRAVENOUS

## 2019-09-10 MED ORDER — LIDOCAINE HCL (PF) 1 % IJ SOLN
INTRAMUSCULAR | Status: DC | PRN
  Administered 2019-09-10: 55 mL
  Administered 2019-09-10: 50 mL

## 2019-09-10 MED ORDER — ACETAMINOPHEN 325 MG PO TABS
325.0000 mg | ORAL_TABLET | ORAL | Status: DC | PRN
  Administered 2019-09-10: 650 mg via ORAL
  Filled 2019-09-10 (×3): qty 2

## 2019-09-10 MED ORDER — CHLORHEXIDINE GLUCONATE 4 % EX LIQD: 4.0000 "application " | Freq: Once | CUTANEOUS | Status: DC

## 2019-09-10 MED ORDER — HEPARIN (PORCINE) IN NACL 1000-0.9 UT/500ML-% IV SOLN
INTRAVENOUS | Status: AC
  Filled 2019-09-10: qty 500

## 2019-09-10 MED ORDER — SODIUM CHLORIDE 0.9 % IV SOLN
INTRAVENOUS | Status: AC | PRN
  Administered 2019-09-10: 200 mL via INTRAVENOUS

## 2019-09-10 MED ORDER — METHYLPREDNISOLONE SODIUM SUCC 125 MG IJ SOLR
62.5000 mg | Freq: Once | INTRAMUSCULAR | Status: AC
  Administered 2019-09-10: 62.5 mg via INTRAVENOUS
  Filled 2019-09-10: qty 2

## 2019-09-10 MED ORDER — SODIUM CHLORIDE 0.9 % IV BOLUS
250.0000 mL | Freq: Once | INTRAVENOUS | Status: AC
  Administered 2019-09-10: 250 mL via INTRAVENOUS

## 2019-09-10 MED ORDER — SODIUM CHLORIDE 0.9 % IV SOLN
INTRAVENOUS | Status: DC | PRN
  Administered 2019-09-10: 13:00:00

## 2019-09-10 MED ORDER — CEFAZOLIN SODIUM-DEXTROSE 1-4 GM/50ML-% IV SOLN
1.0000 g | Freq: Once | INTRAVENOUS | Status: DC
  Filled 2019-09-10: qty 50

## 2019-09-10 SURGICAL SUPPLY — 8 items
CABLE SURGICAL S-101-97-12 (CABLE) ×2 IMPLANT
DEVICE TORQUE .025-.038 (MISCELLANEOUS) ×2 IMPLANT
GUIDEWIRE ANGLED .035X150CM (WIRE) ×2 IMPLANT
ICD GALLANT VR CDVRA500Q (ICD Generator) ×2 IMPLANT
LEAD DURATA 7122Q-58CM (Lead) ×2 IMPLANT
PAD PRO RADIOLUCENT 2001M-C (PAD) ×2 IMPLANT
SHEATH 7FR PRELUDE SNAP 13 (SHEATH) ×2 IMPLANT
TRAY PACEMAKER INSERTION (PACKS) ×2 IMPLANT

## 2019-09-10 NOTE — Progress Notes (Signed)
Paged for lethargy. Pt received Benadryl @ 1146.   On my arrival, pt slightly hypotensive and sleeping. Rouses to voice after several attempts and answer questions appropriately about where he is and what he had done today.   Continue to monitor up to his discharge time at 2000.  Legrand Como [address]" [place], PA-C  09/10/2019 3:28 PM

## 2019-09-10 NOTE — Interval H&P Note (Signed)
History and Physical Interval Note:  09/10/2019 10:35 AM  Jimmy Mcardle Sr.  has presented today for surgery, with the diagnosis of cardiomyopathy.  The various methods of treatment have been discussed with the patient and family. After consideration of risks, benefits and other options for treatment, the patient has consented to  Procedure(s): ICD IMPLANT (N/A) as a surgical intervention.  The patient's history has been reviewed, patient examined, no change in status, stable for surgery.  I have reviewed the patient's chart and labs.  Questions were answered to the patient's satisfaction.     Cristopher Peru

## 2019-09-10 NOTE — Progress Notes (Signed)
Dr Lovena Le in and notified of b/p's and order noted and per Dr Lovena Le ok to d/c home if b/p >48 systolic

## 2019-09-10 NOTE — Progress Notes (Signed)
Patient has 5 pocket knives (varying sizes) locked up in security.

## 2019-09-10 NOTE — Discharge Instructions (Signed)
Supplemental Discharge Instructions for  Pacemaker/Defibrillator Patients  Activity Do not raise your left/right arm above shoulder level or extend it backward beyond shoulder level for 2 weeks. Wear the arm sling as a reminder or as needed for comfort for 2 weeks. No heavy lifting or vigorous activity with your left/right arm for 6-8 weeks.    NO DRIVING is preferable for 2 weeks; If absolutely necessary, drive only short, familiar routes. DO wear your seatbelt, even if it crosses over the pacemaker site.  WOUND CARE - Keep the wound area clean and dry.  Remove the dressing the day after you return home (usually 48 hours after the procedure). - DO NOT SUBMERGE UNDER WATER UNTIL FULLY HEALED (no tub baths, hot tubs, swimming pools, etc.).  - You  may shower or take a sponge bath after the dressing is removed. DO NOT SOAK the area and do not allow the shower to directly spray on the site. - If you have staples, these will be removed in the office in 7-14 days. - If you have tape/steri-strips on your wound, these will fall off; do not pull them off prematurely.   - No bandage is needed on the site.  DO  NOT apply any creams, oils, or ointments to the wound area. - If you notice any drainage or discharge from the wound, any swelling, excessive redness or bruising at the site, or if you develop a fever > 101? F after you are discharged home, call the office at once.  Special Instructions - You are still able to use cellular telephones.  Avoid carrying your cellular phone near your device. - When traveling through airports, show security personnel your identification card to avoid being screened in the metal detectors.  - Avoid arc welding equipment, MRI testing (magnetic resonance imaging), TENS units (transcutaneous nerve stimulators).  Call the office for questions about other devices. - Avoid electrical appliances that are in poor condition or are not properly grounded. - Microwave ovens are  safe to be near or to operate.  Additional information for defibrillator patients should your device go off: - If your device goes off ONCE and you feel fine afterward, notify the clinic at [phone]. - If your device goes off ONCE and you do not feel well afterward, call 911. - If your device goes off TWICE or more in one day, call 911.  DO NOT DRIVE YOURSELF OR A FAMILY MEMBER WITH A DEFIBRILLATOR TO THE HOSPITAL--CALL 911.  Cardioverter Defibrillator Implantation, Care After This sheet gives you information about how to care for yourself after your procedure. Your health care provider may also give you more specific instructions. If you have problems or questions, contact your health care provider. What can I expect after the procedure? After the procedure, it is common to have:  Some pain. It may last a few days.  A slight bump over the skin where the device was placed. Sometimes, it is possible to feel the device under the skin. This is normal. During the months and years after your procedure, your health care provider will check the device, the leads, and the battery every few months. Eventually, when the battery is low, the device will be replaced. Follow these instructions at home: Medicines  Take over-the-counter and prescription medicines only as told by your health care provider.  If you were prescribed an antibiotic medicine, take it as told by your health care provider. Do not stop taking the antibiotic even if you start to feel  better. Incision care      Follow instructions from your health care provider about how to take care of your incision area. Make sure you: ? Wash your hands with soap and water before you change your bandage (dressing). If soap and water are not available, use hand sanitizer. ? Change your dressing as told by your health care provider. ? Leave stitches (sutures), skin glue, or adhesive strips in place. These skin closures may need to stay in  place for 2 weeks or longer. If adhesive strip edges start to loosen and curl up, you may trim the loose edges. Do not remove adhesive strips completely unless your health care provider tells you to do that.  Check your incision area every day for signs of infection. Check for: ? More redness, swelling, or pain. ? More fluid or blood. ? Warmth. ? Pus or a bad smell.  Do not use lotions or ointments near the incision area unless told by your health care provider.  Keep the incision area clean and dry for 2-3 days after the procedure or for as long as told by your health care provider. It takes several weeks for the incision site to heal completely.  Do not take baths, swim, or use a hot tub until your health care provider approves. Activity  Try to walk a little every day. Exercising is important after this procedure. Also, use your shoulder on the side of the defibrillator in daily tasks that do not require a lot of motion.  For at least 6 weeks: ? Do not lift your upper arm above your shoulders. This means no tennis, golf, or swimming for this period of time. If you tend to sleep with your arm above your head, use a restraint to prevent this during sleep. ? Avoid sudden jerking, pulling, or chopping movements that pull your upper arm far away from your body.  Ask your health care provider when you may go back to work.  Check with your health care provider before you start to drive or play sports. Electric and magnetic fields  Tell all health care providers that you have a defibrillator. This may prevent them from giving you an MRI scan because strong magnets are used for that test.  If you must pass through a metal detector, quickly walk through it. Do not stop under the detector, and do not stand near it.  Avoid places or objects that have a strong electric or magnetic field, including: ? Airport Herbalist. At the airport, let officials know that you have a defibrillator. Your  defibrillator ID card will let you be checked in a way that is safe for you and will not damage your defibrillator. Also, do not let a security person wave a magnetic wand near your defibrillator. That can make it stop working. ? Power plants. ? Large electrical generators. ? Anti-theft systems or electronic article surveillance (EAS). ? Radiofrequency transmission towers, such as cell phone and radio towers.  Do not use amateur (ham) radio equipment or electric (arc) welding torches. Some devices are safe to use if held at least 12 inches (30 cm) from your defibrillator. These include power tools, lawn mowers, and speakers. If you are unsure if something is safe to use, ask your health care provider.  Do not use MP3 player headphones. They have magnets.  You may safely use electric blankets, heating pads, computers, and microwave ovens.  When using your cell phone, hold it to the ear that is on the  opposite side from the defibrillator. Do not leave your cell phone in a pocket over the defibrillator. General instructions  Follow diet instructions from your health care provider, if this applies.  Always keep your defibrillator ID card with you. The card should list the implant date, device model, and manufacturer. Consider wearing a medical alert bracelet or necklace.  Have your defibrillator checked every 3-6 months or as often as told by your health care provider. Most defibrillators last for 4-8 years.  Keep all follow-up visits as told by your health care provider. This is important for your health care provider to make sure your chest is healing the way it should. Ask your health care provider when you should come back to have your stitches or staples taken out. Contact a health care provider if:  You feel one shock in your chest.  You gain weight suddenly.  Your legs or feet swell more than they have before.  It feels like your heart is fluttering or skipping beats (heart  palpitations).  You have more redness, swelling, or pain around your incision.  You have more fluid or blood coming from your incision.  Your incision feels warm to the touch.  You have pus or a bad smell coming from your incision.  You have a fever. Get help right away if:  You have chest pain.  You feel more than one shock.  You feel more short of breath than you have felt before.  You feel more light-headed than you have felt before.  Your incision starts to open up. This information is not intended to replace advice given to you by your health care provider. Make sure you discuss any questions you have with your health care provider. Document Released: 04/02/2005 Document Revised: 12/08/2017 Document Reviewed: 02/18/2016 Elsevier Patient Education  2020 [employer].

## 2019-09-11 MED FILL — Gentamicin Sulfate Inj 40 MG/ML: INTRAMUSCULAR | Qty: 80 | Status: AC

## 2019-09-11 MED FILL — Midazolam HCl Inj 5 MG/5ML (Base Equivalent): INTRAMUSCULAR | Qty: 5 | Status: AC

## 2019-09-11 MED FILL — Fentanyl Citrate Preservative Free (PF) Inj 100 MCG/2ML: INTRAMUSCULAR | Qty: 2 | Status: AC

## 2019-09-11 MED FILL — Lidocaine HCl Local Inj 1%: INTRAMUSCULAR | Qty: 120 | Status: AC

## 2019-09-25 ENCOUNTER — Ambulatory Visit: Payer: Medicare Other

## 2019-09-26 DIAGNOSIS — E1129 Type 2 diabetes mellitus with other diabetic kidney complication: Secondary | ICD-10-CM | POA: Diagnosis not present

## 2019-09-26 DIAGNOSIS — J449 Chronic obstructive pulmonary disease, unspecified: Secondary | ICD-10-CM | POA: Diagnosis not present

## 2019-09-26 DIAGNOSIS — E1151 Type 2 diabetes mellitus with diabetic peripheral angiopathy without gangrene: Secondary | ICD-10-CM | POA: Diagnosis not present

## 2019-09-26 DIAGNOSIS — K5090 Crohn's disease, unspecified, without complications: Secondary | ICD-10-CM | POA: Diagnosis not present

## 2019-09-28 DIAGNOSIS — C3490 Malignant neoplasm of unspecified part of unspecified bronchus or lung: Secondary | ICD-10-CM | POA: Diagnosis not present

## 2019-09-28 DIAGNOSIS — J449 Chronic obstructive pulmonary disease, unspecified: Secondary | ICD-10-CM | POA: Diagnosis not present

## 2019-10-09 ENCOUNTER — Ambulatory Visit (INDEPENDENT_AMBULATORY_CARE_PROVIDER_SITE_OTHER): Payer: Medicare Other | Admitting: *Deleted

## 2019-10-09 ENCOUNTER — Other Ambulatory Visit: Payer: Self-pay

## 2019-10-09 DIAGNOSIS — I428 Other cardiomyopathies: Secondary | ICD-10-CM

## 2019-10-09 NOTE — Patient Instructions (Signed)
Call device clinic if you have redness, drainage ,and increase swelling at either wound site.

## 2019-10-15 DIAGNOSIS — Z6823 Body mass index (BMI) 23.0-23.9, adult: Secondary | ICD-10-CM | POA: Diagnosis not present

## 2019-10-15 DIAGNOSIS — R7989 Other specified abnormal findings of blood chemistry: Secondary | ICD-10-CM | POA: Diagnosis not present

## 2019-10-15 DIAGNOSIS — R5383 Other fatigue: Secondary | ICD-10-CM | POA: Diagnosis not present

## 2019-10-15 DIAGNOSIS — Z23 Encounter for immunization: Secondary | ICD-10-CM | POA: Diagnosis not present

## 2019-10-16 LAB — CUP PACEART INCLINIC DEVICE CHECK
Brady Statistic RV Percent Paced: 1 %
Date Time Interrogation Session: 20210112160000
Implantable Lead Implant Date: 20201214
Implantable Lead Location: 753860
Implantable Pulse Generator Implant Date: 20201214
Lead Channel Pacing Threshold Amplitude: 0.75 V
Lead Channel Pacing Threshold Pulse Width: 0.5 ms
Lead Channel Sensing Intrinsic Amplitude: 12 mV
Pulse Gen Serial Number: 11013076

## 2019-10-16 NOTE — Progress Notes (Signed)
Wound check appointment. Steri-strips removed. Wound without redness or edema. Incision edges approximated, wound well healed. Normal device function. Thresholds, sensing, and impedances consistent with implant measurements. Device programmed at 3.5V for extra safety margin until 3 month visit. Histogram distribution appropriate for patient and level of activity. No  ventricular arrhythmias noted. Next remote transmission scheduled for 12/11/19. Patient educated about wound care, arm mobility, lifting restrictions, shock plan. ROV with Dr Lovena Le on 12/11/19.

## 2019-10-19 ENCOUNTER — Other Ambulatory Visit: Payer: Self-pay

## 2019-10-19 ENCOUNTER — Encounter: Payer: Self-pay | Admitting: Cardiology

## 2019-10-19 ENCOUNTER — Telehealth: Payer: Self-pay

## 2019-10-19 ENCOUNTER — Ambulatory Visit (INDEPENDENT_AMBULATORY_CARE_PROVIDER_SITE_OTHER): Payer: Medicare Other | Admitting: Cardiology

## 2019-10-19 VITALS — BP 114/69 | HR 65 | Temp 98.2°F | Resp 16 | Ht 71.5 in | Wt 152.0 lb

## 2019-10-19 DIAGNOSIS — F17209 Nicotine dependence, unspecified, with unspecified nicotine-induced disorders: Secondary | ICD-10-CM | POA: Diagnosis not present

## 2019-10-19 DIAGNOSIS — F17210 Nicotine dependence, cigarettes, uncomplicated: Secondary | ICD-10-CM

## 2019-10-19 DIAGNOSIS — Z95810 Presence of automatic (implantable) cardiac defibrillator: Secondary | ICD-10-CM | POA: Diagnosis not present

## 2019-10-19 DIAGNOSIS — I255 Ischemic cardiomyopathy: Secondary | ICD-10-CM

## 2019-10-19 DIAGNOSIS — I2510 Atherosclerotic heart disease of native coronary artery without angina pectoris: Secondary | ICD-10-CM | POA: Diagnosis not present

## 2019-10-19 MED ORDER — NICOTINE 21 MG/24HR TD PT24: 21.0000 mg | MEDICATED_PATCH | Freq: Every day | TRANSDERMAL | 0 refills | Status: DC

## 2019-10-19 NOTE — Telephone Encounter (Signed)
The pt wanted to know what he had to do to send a transmission. I let him know since he have the app on his phone he should sleep near his phone. I tried to help him send a manual transmission with his app to make sure it was working properly. I gave him the number to [facility] support to get additional help.

## 2019-10-19 NOTE — Progress Notes (Signed)
Primary Physician/Referring:  Maryella Shivers, MD  Patient ID: Jimmy Ochs., male    DOB: 22-Feb-1966, 54 y.o.   MRN: [id number]  Chief Complaint  Patient presents with  . Coronary Artery Disease  . Cardiomyopathy  . Follow-up   HPI:    HPI: Jimmy Nelson.  is a 54 y.o. Caucasian male with lung cancer in remission, ongoing tobacco use disorder, COPD, hypertension, admitted with acute pulmonary and underwent successful thrombectomy followed by stenting to proximal and mid LAD on 05/05/2019.  LVEF 25 to 30% and had NSVT on telemetry.    Underwent ICD implantation on 12/14. No chest pain. Has chronic dyspnea on exertion that is stable. He is tolerating Entresto and high dose BB without dizziness or worsening dyspnea. No leg edema.    Now smoking only 3 cigarettes a day and having difficulty in quitting smoking with use of moderate does nicorette patches  Past Medical History:  Diagnosis Date  . Amnesia   . Anemia   . COPD (chronic obstructive pulmonary disease) ([place])   . Crohn's disease ([place]) Deteriorating disk  . Depression   . History of MI (myocardial infarction) 05/05/2019  . History of stroke    2  . Hyperlipidemia   . Hypertension   . Lung cancer ([place])   . Mental disorder   . Sleep apnea   . Stroke [facility])    Past Surgical History:  Procedure Laterality Date  . APPENDECTOMY    . CORONARY/GRAFT ACUTE MI REVASCULARIZATION N/A 05/05/2019   Procedure: Coronary/Graft Acute MI Revascularization;  Surgeon: Adrian Prows, MD;  Location: [place] [facility];  Service: Cardiovascular;  Laterality: N/A;  . EYE SURGERY    . ICD IMPLANT N/A 09/10/2019   Procedure: ICD IMPLANT;  Surgeon: Evans Lance, MD;  Location: [place] [facility];  Service: Cardiovascular;  Laterality: N/A;  . intestestine for chrohns disease    . LEFT HEART CATH AND CORONARY ANGIOGRAPHY N/A 05/05/2019   Procedure: LEFT HEART CATH AND CORONARY ANGIOGRAPHY;  Surgeon: Adrian Prows, MD;  Location: [place] [facility];  Service: Cardiovascular;  Laterality: N/A;  . PORT-A-CATH REMOVAL     Social History   Socioeconomic History  . Marital status: Legally Separated    Spouse name: Not on file  . Number of children: 5  . Years of education: Not on file  . Highest education level: Not on file  Occupational History  . Not on file  Tobacco Use  . Smoking status: Current Every Day Smoker    Types: Cigarettes  . Smokeless tobacco: Current User    Types: Snuff  . Tobacco comment: 3 cigarettes daily   Substance and Sexual Activity  . Alcohol use: Not Currently  . Drug use: No  . Sexual activity: Yes  Other Topics Concern  . Not on file  Social History Narrative  . Not on file   Social Determinants of Health   Financial Resource Strain: High Risk  . Difficulty of Paying Living Expenses: Hard  Food Insecurity: Food Insecurity Present  . Worried About Charity fundraiser in the Last Year: Sometimes true  . Ran Out of Food in the Last Year: Sometimes true  Transportation Needs: Unmet Transportation Needs  . Lack of Transportation (Medical): No  . Lack of Transportation (Non-Medical): Yes  Physical Activity:   . Days of Exercise per Week: Not on file  . Minutes of Exercise per Session: Not on file  Stress:   .  Feeling of Stress : Not on file  Social Connections:   . Frequency of Communication with Friends and Family: Not on file  . Frequency of Social Gatherings with Friends and Family: Not on file  . Attends Religious Services: Not on file  . Active Member of Clubs or Organizations: Not on file  . Attends Archivist Meetings: Not on file  . Marital Status: Not on file  Intimate Partner Violence:   . Fear of Current or Ex-Partner: Not on file  . Emotionally Abused: Not on file  . Physically Abused: Not on file  . Sexually Abused: Not on file   ROS  Review of Systems  Constitution: Negative for chills, decreased appetite, malaise/fatigue and weight gain.   Cardiovascular: Negative for chest pain, dyspnea on exertion, leg swelling and syncope.  Respiratory: Positive for shortness of breath.   Endocrine: Negative for cold intolerance.  Hematologic/Lymphatic: Does not bruise/bleed easily.  Musculoskeletal: Negative for joint swelling.  Gastrointestinal: Negative for abdominal pain, anorexia, change in bowel habit, hematochezia and melena.  Neurological: Negative for headaches and light-headedness.  Psychiatric/Behavioral: Positive for depression. Negative for substance abuse. The patient is nervous/anxious.   All other systems reviewed and are negative.  Objective  Blood pressure 114/69, pulse 65, temperature 98.2 F (36.8 C), temperature source Temporal, resp. rate 16, height 5' 11.5" (1.816 m), weight 152 lb (68.9 kg), SpO2 95 %. Body mass index is 20.9 kg/m.   Physical Exam  Constitutional: He is oriented to person, place, and time. He appears well-nourished. He appears cachectic. No distress.  Appears older than stated age  HENT:  Head: Atraumatic.  Eyes: Conjunctivae are normal.  Neck: No JVD present. No thyromegaly present.  Cardiovascular: Normal rate, regular rhythm, intact distal pulses and normal pulses. Exam reveals distant heart sounds. Exam reveals no gallop.  No murmur heard. Intact distal pulse. No leg edema. No JVD.    Pulmonary/Chest: Effort normal. Tachypnea noted. He has no decreased breath sounds.  Chest barrel shaped. Prolonged expiration.  Abdominal: Soft. Bowel sounds are normal.  Musculoskeletal:        General: Normal range of motion.     Cervical back: Neck supple.  Neurological: He is alert and oriented to person, place, and time.  Skin: Skin is warm and dry.  Psychiatric: His mood appears anxious.  Vitals reviewed.  Radiology: No results found.  Laboratory examination:   Recent Labs    05/06/19 0246 05/25/19 1049 08/22/19 1207  NA 140 139 144  K 3.0* 3.1* 3.7  CL 101 106 109*  CO2 24 25 21    GLUCOSE 170* 93 64*  BUN 13 10 13   CREATININE 1.25* 1.58* 1.45*  CALCIUM 9.4 8.5* 9.3  GFRNONAA >60 49* 55*  GFRAA >60 57* 63   CMP Latest Ref Rng & Units 08/22/2019 05/25/2019 05/06/2019  Glucose 65 - 99 mg/dL 64(L) 93 170(H)  BUN 6 - 24 mg/dL 13 10 13   Creatinine 0.76 - 1.27 mg/dL 1.45(H) 1.58(H) 1.25(H)  Sodium 134 - 144 mmol/L 144 139 140  Potassium 3.5 - 5.2 mmol/L 3.7 3.1(L) 3.0(L)  Chloride 96 - 106 mmol/L 109(H) 106 101  CO2 20 - 29 mmol/L 21 25 24   Calcium 8.7 - 10.2 mg/dL 9.3 8.5(L) 9.4  Total Protein 6.5 - 8.1 g/dL - 6.2(L) -  Total Bilirubin 0.3 - 1.2 mg/dL - 0.5 -  Alkaline Phos 38 - 126 U/L - 61 -  AST 15 - 41 U/L - 21 -  ALT 0 -  44 U/L - 24 -   CBC Latest Ref Rng & Units 08/22/2019 05/25/2019 05/06/2019  WBC 3.4 - 10.8 x10E3/uL 8.1 12.3(H) 20.6(H)  Hemoglobin 13.0 - 17.7 g/dL 15.1 13.5 16.4  Hematocrit 37.5 - 51.0 % 46.2 42.8 51.2  Platelets 150 - 450 x10E3/uL 310 276 346   Lipid Panel     Component Value Date/Time   CHOL 117 05/05/2019 0312   TRIG 141 05/05/2019 0312   HDL 33 (L) 05/05/2019 0312   CHOLHDL 3.5 05/05/2019 0312   VLDL 28 05/05/2019 0312   LDLCALC 56 05/05/2019 0312   HEMOGLOBIN A1C Lab Results  Component Value Date   HGBA1C 5.4 05/05/2019   MPG 108.28 05/05/2019   TSH No results for input(s): TSH in the last 8760 hours. Medications   Current Outpatient Medications  Medication Instructions  . acetaminophen (TYLENOL) 650 mg, Oral, Every 6 hours PRN  . albuterol (PROVENTIL HFA;VENTOLIN HFA) 108 (90 BASE) MCG/ACT inhaler 1 puff, Inhalation, Every 4 hours PRN  . aspirin 81 mg, Oral, Daily  . atorvastatin (LIPITOR) 80 mg, Oral, Daily-1800  . Cyanocobalamin 1,500 mcg, Oral, Daily  . diclofenac Sodium (VOLTAREN) 1 % GEL 1 application, Topical, 2 times daily  . divalproex (DEPAKOTE) 250-500 mg, Oral, See admin instructions, Tale 250 mg in the morning and 500 mg in the evening  . docusate sodium (COLACE) 100 mg, Oral, 2 times daily  . ferrous  sulfate 325 mg, Oral, Daily with breakfast  . gabapentin (NEURONTIN) 600 mg, Oral, 3 times daily  . hydrOXYzine (ATARAX/VISTARIL) 25 mg, Oral, Every 8 hours PRN  . ibuprofen (ADVIL) 800 mg, Oral, Every 8 hours PRN  . ipratropium-albuterol (DUONEB) 0.5-2.5 (3) MG/3ML SOLN 3 mLs, Inhalation, Every 6 hours PRN  . metoprolol succinate (TOPROL-XL) 100 mg, Oral, 2 times daily, Take with or immediately following a meal.  . montelukast (SINGULAIR) 10 mg, Oral, Daily  . Multiple Vitamins-Minerals (MULTIVITAMIN WITH MINERALS) tablet 1 tablet, Oral, Daily  . nicotine (NICODERM CQ - DOSED IN MG/24 HOURS) 14 mg, Transdermal, Daily  . nicotine (NICODERM CQ) 21 mg, Transdermal, Daily  . nitroGLYCERIN (NITROSTAT) 0.4 mg, Sublingual, Every 5 min PRN  . OXYGEN 2.5 L, Inhalation  . oxymetazoline (AFRIN) 0.05 % nasal spray 1 spray, Each Nare, 2 times daily PRN  . pyridOXINE (VITAMIN B-6) 100 mg, Oral, Daily  . sacubitril-valsartan (ENTRESTO) 49-51 MG 1 tablet, Oral, 2 times daily  . SYMBICORT 160-4.5 MCG/ACT inhaler 2 puffs, Inhalation, 2 times daily  . tadalafil (CIALIS) 20 mg, Oral, Daily PRN  . ticagrelor (BRILINTA) 90 mg, Oral, 2 times daily  . traZODone (DESYREL) 200 mg, Oral, Daily at bedtime    Cardiac Studies:   Echocardiogram 07/20/2019: Left ventricle cavity is normal in size. Mild concentric hypertrophy of the left ventricle. Moderate global hypokinesis with distal antero-apical, apical dyskinesis c/w prior myocardial infarction. Severely depressed LV systolic function with visual EF 25-30%. Indeterminate diastolic filling pattern.  No significant valvular abnormality. Normal right atrial pressure. No significant change compared to previous study on 05/05/2019.   Coronary Angiography 05/05/2019:Prox LAD to Mid LAD lesion is 100% stenosed S/P aspiration thrombectomy followed by overlapping 3.5 x 20 and a 3.0 x 12 mm Synergy DES distally, stenosis reduced from 10 percent to 0%. D1 has ostial 20 to  30% stenosis. Circumflex: Mild disease in the proximal and, OM 2 is very tiny and severely diseased. RCA is dominant, proximal 50% stenosis. LVEDP markedly elevated at 27 mmHg. EF 10 to 15% with anterolateral akinesis.  110 mL contrast utilized, 6.9 minutes of fluoroscopy time. Recommendation: Patient will be kept in the intensive care unit, he may need a hospital admission for 48 hours or more in view of severe LV dysfunction unless he does well clinically. I discussed the findings with his wife on the telephone.   Assessment     ICD-10-CM   1. Coronary artery disease involving native coronary artery of native heart without angina pectoris  I25.10 Lipid Profile    Comprehensive Metabolic Panel (CMET)  2. Ischemic cardiomyopathy  I25.5   3. ICD (implantable cardioverter-defibrillator) in place  Z95.810   4. Tobacco use disorder, continuous  F17.209      EKG 05/11/2019: Sinus tachycardia at rate of 105 bpm, normal axis, anteroseptal infarct old.  Persistent ST elevation in V1 to V4, may suggest aneurysm formation.  Normal QT interval.   EKG 05/05/2019 at 4:48 AM: Sinus tachycardia heart rate of 102 bpm, right atrial enlargement, normal axis. 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 Anteroseptal infarct acute. No significant change since prior EKG at 2 AM.  Recommendations:   Patient is here on a 3 month office visit and follow up for CAD and ischemic cardiomyopathy.  He is doing well without any symptoms of angina.  No clinical evidence of heart failure.  States that he feels the best he has in 20 years.  Blood pressure is well controlled, will continue with his current medications.  He has not had any events by ICD transmission.  He is to see Dr. Lovena Le in March for follow-up, after his follow up with him, we can take over monitoring his device.   I recommended checking CMP along with lipids for surveillance.  Continues to be on high-dose Lipitor.  May make further changes depending upon lab results.   He does continue to smoke 3 to 4 cigarettes/day. Extensive discussion regarding the importance and ways for him to quit smoking. States that the lower dose of be NicoDerm patch does not seem to be helping him to quit.  He is requesting 21 mg patches, which I will refill for next 1 month.  We have also discussed the ziplock bag technique to help him with quitting.  He appears motivated to quit.  We will continue to monitor his progress.  I will see him back in 3 months, but encouraged him to contact me sooner if needed.    Miquel Dunn, MSN, APRN, FNP-C [facility]. [place] Office: [phone] Fax: [phone]

## 2019-10-26 DIAGNOSIS — K5090 Crohn's disease, unspecified, without complications: Secondary | ICD-10-CM | POA: Diagnosis not present

## 2019-10-26 DIAGNOSIS — J449 Chronic obstructive pulmonary disease, unspecified: Secondary | ICD-10-CM | POA: Diagnosis not present

## 2019-10-26 DIAGNOSIS — E1151 Type 2 diabetes mellitus with diabetic peripheral angiopathy without gangrene: Secondary | ICD-10-CM | POA: Diagnosis not present

## 2019-10-26 DIAGNOSIS — E1129 Type 2 diabetes mellitus with other diabetic kidney complication: Secondary | ICD-10-CM | POA: Diagnosis not present

## 2019-10-29 DIAGNOSIS — J449 Chronic obstructive pulmonary disease, unspecified: Secondary | ICD-10-CM | POA: Diagnosis not present

## 2019-10-29 DIAGNOSIS — Z6822 Body mass index (BMI) 22.0-22.9, adult: Secondary | ICD-10-CM | POA: Diagnosis not present

## 2019-10-29 DIAGNOSIS — C3490 Malignant neoplasm of unspecified part of unspecified bronchus or lung: Secondary | ICD-10-CM | POA: Diagnosis not present

## 2019-10-31 ENCOUNTER — Telehealth: Payer: Self-pay

## 2019-10-31 NOTE — Telephone Encounter (Signed)
Jimmy Nelson states the pt ppm ID have not came into the mail yet. I gave her the number to [facility] support to get additonal help.

## 2019-11-25 DIAGNOSIS — J449 Chronic obstructive pulmonary disease, unspecified: Secondary | ICD-10-CM | POA: Diagnosis not present

## 2019-11-25 DIAGNOSIS — E1129 Type 2 diabetes mellitus with other diabetic kidney complication: Secondary | ICD-10-CM | POA: Diagnosis not present

## 2019-11-25 DIAGNOSIS — E1151 Type 2 diabetes mellitus with diabetic peripheral angiopathy without gangrene: Secondary | ICD-10-CM | POA: Diagnosis not present

## 2019-11-25 DIAGNOSIS — K5090 Crohn's disease, unspecified, without complications: Secondary | ICD-10-CM | POA: Diagnosis not present

## 2019-11-26 DIAGNOSIS — J449 Chronic obstructive pulmonary disease, unspecified: Secondary | ICD-10-CM | POA: Diagnosis not present

## 2019-11-26 DIAGNOSIS — C3490 Malignant neoplasm of unspecified part of unspecified bronchus or lung: Secondary | ICD-10-CM | POA: Diagnosis not present

## 2019-12-11 ENCOUNTER — Encounter: Payer: Medicare Other | Admitting: Internal Medicine

## 2019-12-11 ENCOUNTER — Ambulatory Visit (INDEPENDENT_AMBULATORY_CARE_PROVIDER_SITE_OTHER): Payer: Medicare Other | Admitting: *Deleted

## 2019-12-11 DIAGNOSIS — I428 Other cardiomyopathies: Secondary | ICD-10-CM

## 2019-12-11 LAB — CUP PACEART REMOTE DEVICE CHECK
Battery Remaining Longevity: 98 mo
Battery Remaining Percentage: 95.5 %
Battery Voltage: 3.04 V
Brady Statistic RV Percent Paced: 1 %
Date Time Interrogation Session: 20210316030158
HighPow Impedance: 68 Ohm
Implantable Lead Implant Date: 20201214
Implantable Lead Location: 753860
Implantable Pulse Generator Implant Date: 20201214
Lead Channel Impedance Value: 480 Ohm
Lead Channel Pacing Threshold Amplitude: 0.75 V
Lead Channel Pacing Threshold Pulse Width: 0.5 ms
Lead Channel Sensing Intrinsic Amplitude: 12 mV
Lead Channel Setting Pacing Amplitude: 3.5 V
Lead Channel Setting Pacing Pulse Width: 0.5 ms
Lead Channel Setting Sensing Sensitivity: 0.5 mV
Pulse Gen Serial Number: 111013076

## 2019-12-11 NOTE — Progress Notes (Signed)
ICD Remote  

## 2019-12-12 ENCOUNTER — Other Ambulatory Visit: Payer: Self-pay

## 2019-12-12 MED ORDER — NICOTINE 21 MG/24HR TD PT24: 21.0000 mg | MEDICATED_PATCH | Freq: Every day | TRANSDERMAL | 6 refills | Status: DC

## 2019-12-26 DIAGNOSIS — K5090 Crohn's disease, unspecified, without complications: Secondary | ICD-10-CM | POA: Diagnosis not present

## 2019-12-26 DIAGNOSIS — J449 Chronic obstructive pulmonary disease, unspecified: Secondary | ICD-10-CM | POA: Diagnosis not present

## 2019-12-26 DIAGNOSIS — E1151 Type 2 diabetes mellitus with diabetic peripheral angiopathy without gangrene: Secondary | ICD-10-CM | POA: Diagnosis not present

## 2019-12-26 DIAGNOSIS — E1129 Type 2 diabetes mellitus with other diabetic kidney complication: Secondary | ICD-10-CM | POA: Diagnosis not present

## 2019-12-27 DIAGNOSIS — J449 Chronic obstructive pulmonary disease, unspecified: Secondary | ICD-10-CM | POA: Diagnosis not present

## 2019-12-27 DIAGNOSIS — C3490 Malignant neoplasm of unspecified part of unspecified bronchus or lung: Secondary | ICD-10-CM | POA: Diagnosis not present

## 2019-12-28 ENCOUNTER — Encounter: Payer: Medicare Other | Admitting: Internal Medicine

## 2020-01-09 DIAGNOSIS — I2510 Atherosclerotic heart disease of native coronary artery without angina pectoris: Secondary | ICD-10-CM | POA: Diagnosis not present

## 2020-01-10 LAB — COMPREHENSIVE METABOLIC PANEL
ALT: 14 IU/L (ref 0–44)
AST: 33 IU/L (ref 0–40)
Albumin/Globulin Ratio: 1.5 (ref 1.2–2.2)
Albumin: 4.4 g/dL (ref 3.8–4.9)
Alkaline Phosphatase: 89 IU/L (ref 39–117)
BUN/Creatinine Ratio: 8 — ABNORMAL LOW (ref 9–20)
BUN: 13 mg/dL (ref 6–24)
Bilirubin Total: 0.4 mg/dL (ref 0.0–1.2)
CO2: 19 mmol/L — ABNORMAL LOW (ref 20–29)
Calcium: 9.8 mg/dL (ref 8.7–10.2)
Chloride: 104 mmol/L (ref 96–106)
Creatinine, Ser: 1.56 mg/dL — ABNORMAL HIGH (ref 0.76–1.27)
GFR calc Af Amer: 58 mL/min/{1.73_m2} — ABNORMAL LOW (ref >59)
GFR calc non Af Amer: 50 mL/min/{1.73_m2} — ABNORMAL LOW (ref >59)
Globulin, Total: 2.9 g/dL (ref 1.5–4.5)
Glucose: 87 mg/dL (ref 65–99)
Potassium: 4.6 mmol/L (ref 3.5–5.2)
Sodium: 143 mmol/L (ref 134–144)
Total Protein: 7.3 g/dL (ref 6.0–8.5)

## 2020-01-10 LAB — LIPID PANEL
Chol/HDL Ratio: 2.8 ratio (ref 0.0–5.0)
Cholesterol, Total: 78 mg/dL — ABNORMAL LOW (ref 100–199)
HDL: 28 mg/dL — ABNORMAL LOW (ref >39)
LDL Chol Calc (NIH): 29 mg/dL (ref 0–99)
Triglycerides: 112 mg/dL (ref 0–149)
VLDL Cholesterol Cal: 21 mg/dL (ref 5–40)

## 2020-01-17 NOTE — Progress Notes (Signed)
Primary Physician/Referring:  Maryella Shivers, MD  Patient ID: [name], male    DOB: 01/03/1966, 54 y.o.   MRN: [id number]  Chief Complaint  Patient presents with  . Coronary Artery Disease  . Cardiomyopathy  . Follow-up    3 month   HPI:    Llewelyn Sheaffer.  is a 54 y.o. Caucasian male with lung cancer in remission, ongoing tobacco use disorder, COPD on continuous home O2 at 2.5L/min, hypertension, CAD SP LAD stenting when he presented with acute pulmonary edema on 05/05/2019, severe ischemic cardiomyopathy with ejection fraction 25 to 30% SP ICD implantation on 09/10/2019.  He now presents for follow-up.    No chest pain. Has chronic dyspnea on exertion that is stable. He is tolerating Entresto and high dose BB without dizziness or worsening dyspnea. No leg edema. Now smoking only 3-4 cigarettes a day and having difficulty in quitting smoking with using high-dose of nicotine patches 21 mg daily.  He has been compliant with all his medications including metoprolol and also Entresto.  States that he is feeling the best he has in quite a while.  No PND or orthopnea, he has not had any chest pain.  Past Medical History:  Diagnosis Date  . Amnesia   . Anemia   . COPD (chronic obstructive pulmonary disease) ([place])   . Crohn's disease ([place]) Deteriorating disk  . Depression   . History of MI (myocardial infarction) 05/05/2019  . History of stroke    2  . Hyperlipidemia   . Hypertension   . Lung cancer ([place])   . Mental disorder   . Sleep apnea   . Stroke [facility])    Past Surgical History:  Procedure Laterality Date  . APPENDECTOMY    . CORONARY/GRAFT ACUTE MI REVASCULARIZATION N/A 05/05/2019   Procedure: Coronary/Graft Acute MI Revascularization;  Surgeon: Adrian Prows, MD;  Location: [place] [facility];  Service: Cardiovascular;  Laterality: N/A;  . EYE SURGERY    . ICD IMPLANT N/A 09/10/2019   Procedure: ICD IMPLANT;  Surgeon: Evans Lance, MD;  Location: [place]  [facility];  Service: Cardiovascular;  Laterality: N/A;  . intestestine for chrohns disease    . LEFT HEART CATH AND CORONARY ANGIOGRAPHY N/A 05/05/2019   Procedure: LEFT HEART CATH AND CORONARY ANGIOGRAPHY;  Surgeon: Adrian Prows, MD;  Location: [place] [facility];  Service: Cardiovascular;  Laterality: N/A;  . PORT-A-CATH REMOVAL     Social History   Tobacco Use  . Smoking status: Current Every Day Smoker    Types: Cigarettes  . Smokeless tobacco: Current User    Types: Snuff  . Tobacco comment: 3 cigarettes daily   Substance Use Topics  . Alcohol use: Not Currently   Marital Status: Legally Separated  ROS  Review of Systems  Respiratory: Positive for shortness of breath.   Psychiatric/Behavioral: Positive for depression. The patient is nervous/anxious.    Objective  Blood pressure 107/71, pulse 81, temperature 97.7 F (36.5 C), temperature source Temporal, resp. rate 16, height 5' 11.5" (1.816 m), weight 154 lb (69.9 kg), SpO2 99 %.  Vitals with BMI 01/18/2020 10/19/2019 09/10/2019  Height 5' 11.5" 5' 11.5" -  Weight 154 lbs 152 lbs -  BMI 53.97 67.34 -  Systolic 193 790 94  Diastolic 71 69 61  Pulse 81 65 84  Some encounter information is confidential and restricted. Go to Review Flowsheets activity to see all data.     Physical Exam  Constitutional:  He appears cachectic.  Appears older than stated age   Neck: No JVD present. No thyromegaly present.  Cardiovascular: Normal rate, regular rhythm and intact distal pulses. Exam reveals distant heart sounds. Exam reveals no gallop.  No murmur heard. Pulmonary/Chest: Effort normal. Tachypnea noted. He has no decreased breath sounds.  Barrel Shaped. Prolonged expiration.  Psychiatric: His mood appears anxious.   Laboratory examination:   Recent Labs    05/25/19 1049 08/22/19 1207 01/09/20 1118  NA 139 144 143  K 3.1* 3.7 4.6  CL 106 109* 104  CO2 25 21 19*  GLUCOSE 93 64* 87  BUN 10 13 13   CREATININE 1.58* 1.45* 1.56*    CALCIUM 8.5* 9.3 9.8  GFRNONAA 49* 55* 50*  GFRAA 57* 63 58*   estimated creatinine clearance is 54.1 mL/min (A) (by C-G formula based on SCr of 1.56 mg/dL (H)).  CMP Latest Ref Rng & Units 01/09/2020 08/22/2019 05/25/2019  Glucose 65 - 99 mg/dL 87 64(L) 93  BUN 6 - 24 mg/dL 13 13 10   Creatinine 0.76 - 1.27 mg/dL 1.56(H) 1.45(H) 1.58(H)  Sodium 134 - 144 mmol/L 143 144 139  Potassium 3.5 - 5.2 mmol/L 4.6 3.7 3.1(L)  Chloride 96 - 106 mmol/L 104 109(H) 106  CO2 20 - 29 mmol/L 19(L) 21 25  Calcium 8.7 - 10.2 mg/dL 9.8 9.3 8.5(L)  Total Protein 6.0 - 8.5 g/dL 7.3 - 6.2(L)  Total Bilirubin 0.0 - 1.2 mg/dL 0.4 - 0.5  Alkaline Phos 39 - 117 IU/L 89 - 61  AST 0 - 40 IU/L 33 - 21  ALT 0 - 44 IU/L 14 - 24   CBC Latest Ref Rng & Units 08/22/2019 05/25/2019 05/06/2019  WBC 3.4 - 10.8 x10E3/uL 8.1 12.3(H) 20.6(H)  Hemoglobin 13.0 - 17.7 g/dL 15.1 13.5 16.4  Hematocrit 37.5 - 51.0 % 46.2 42.8 51.2  Platelets 150 - 450 x10E3/uL 310 276 346   Lipid Panel     Component Value Date/Time   CHOL 78 (L) 01/09/2020 1118   TRIG 112 01/09/2020 1118   HDL 28 (L) 01/09/2020 1118   CHOLHDL 2.8 01/09/2020 1118   CHOLHDL 3.5 05/05/2019 0312   VLDL 28 05/05/2019 0312   LDLCALC 29 01/09/2020 1118   HEMOGLOBIN A1C Lab Results  Component Value Date   HGBA1C 5.4 05/05/2019   MPG 108.28 05/05/2019   TSH No results for input(s): TSH in the last 8760 hours.   External labs:  Cholesterol, total 78.000 01/09/2020 HDL 28.000 01/09/2020 LDL 56.000 05/05/2019 Triglycerides 112.000 01/09/2020  A1C 5.400 05/05/2019  Hemoglobin 15.300 G/ 08/22/2019  Creatinine, Serum 1.560 01/09/2020 Potassium 4.600 01/09/2020 ALT (SGPT) 14.000 01/09/2020   Medications and allergies   Allergies  Allergen Reactions  . Bee Venom Anaphylaxis  . Iodinated Diagnostic Agents Anaphylaxis    It is noted that pt is allergic to Iodinated contrast media-iv dye,oral contrast  . Chantix [Varenicline Tartrate]     Seizures       Current Outpatient Medications  Medication Instructions  . acetaminophen (TYLENOL) 650 mg, Oral, Every 6 hours PRN  . albuterol (PROVENTIL HFA;VENTOLIN HFA) 108 (90 BASE) MCG/ACT inhaler 1 puff, Inhalation, Every 4 hours PRN  . aspirin 81 mg, Oral, Daily  . atorvastatin (LIPITOR) 80 mg, Oral, Daily-1800  . Cyanocobalamin 1,500 mcg, Oral, Daily  . diclofenac Sodium (VOLTAREN) 1 % GEL 1 application, Topical, 2 times daily  . divalproex (DEPAKOTE) 250-500 mg, Oral, See admin instructions, Tale 250 mg in the morning and 500 mg in  the evening  . docusate sodium (COLACE) 100 mg, Oral, 2 times daily  . ferrous sulfate 325 mg, Oral, Daily with breakfast  . gabapentin (NEURONTIN) 600 mg, Oral, 3 times daily  . hydrOXYzine (ATARAX/VISTARIL) 25 mg, Oral, Every 8 hours PRN  . ibuprofen (ADVIL) 800 mg, Oral, Every 8 hours PRN  . ipratropium-albuterol (DUONEB) 0.5-2.5 (3) MG/3ML SOLN 3 mLs, Inhalation, Every 6 hours PRN  . metoprolol succinate (TOPROL-XL) 100 mg, Oral, 2 times daily, Take with or immediately following a meal.  . montelukast (SINGULAIR) 10 mg, Oral, Daily  . Multiple Vitamins-Minerals (MULTIVITAMIN WITH MINERALS) tablet 1 tablet, Oral, Daily  . nicotine (NICODERM CQ) 14 mg, Transdermal, Daily  . nitroGLYCERIN (NITROSTAT) 0.4 mg, Sublingual, Every 5 min PRN  . OXYGEN 2.5 L, Inhalation  . oxymetazoline (AFRIN) 0.05 % nasal spray 1 spray, Each Nare, 2 times daily PRN  . pyridOXINE (VITAMIN B-6) 100 mg, Oral, Daily  . sacubitril-valsartan (ENTRESTO) 49-51 MG 1 tablet, Oral, 2 times daily  . SYMBICORT 160-4.5 MCG/ACT inhaler 2 puffs, Inhalation, 2 times daily  . tadalafil (CIALIS) 20 mg, Oral, Daily PRN  . ticagrelor (BRILINTA) 90 mg, Oral, 2 times daily  . traZODone (DESYREL) 200 mg, Oral, Daily at bedtime    Radiology:  No results found.  Cardiac Studies:   Coronary Angiography 05/05/2019:Prox LAD to Mid LAD lesion is 100% stenosed S/P aspiration thrombectomy followed by  overlapping 3.5 x 20 and a 3.0 x 12 mm Synergy DES distally, stenosis reduced from 10 percent to 0%. D1 has ostial 20 to 30% stenosis. Circumflex: Mild disease in the proximal and, OM 2 is very tiny and severely diseased. RCA is dominant, proximal 50% stenosis. LVEDP markedly elevated at 27 mmHg. EF 10 to 15% with anterolateral akinesis. 110 mL contrast utilized, 6.9 minutes of fluoroscopy time. Recommendation: Patient will be kept in the intensive care unit, he may need a hospital admission for 48 hours or more in view of severe LV dysfunction unless he does well clinically. I discussed the findings with his wife on the telephone.  Echocardiogram 07/20/2019: Left ventricle cavity is normal in size. Mild concentric hypertrophy of the left ventricle. Moderate global hypokinesis with distal antero-apical, apical dyskinesis c/w prior myocardial infarction. Severely depressed LV systolic function with visual EF 25-30%. Indeterminate diastolic filling pattern.  No significant valvular abnormality. Normal right atrial pressure. No significant change compared to previous study on 05/05/2019.   ICD implantation 09/10/2019: CONCLUSIONS:   1. Ischemic cardiomyopathy with chronic New York Heart Association class II heart failure.   2. Successful ICD implantation.   3. No early apparent complications.   EKG:   EKG 01/18/2020: Sinus rhythm with borderline first-degree AV block at the rate of 72 bpm, normal axis, cannot exclude septal infarct old.  No evidence of ischemia, low-voltage complexes.  No significant change from 05/11/2019.  Assessment     ICD-10-CM   1. Coronary artery disease involving native coronary artery of native heart without angina pectoris  I25.10 EKG 12-Lead    ticagrelor (BRILINTA) 90 MG TABS tablet    nicotine (NICODERM CQ) 14 mg/24hr patch    PCV ECHOCARDIOGRAM COMPLETE  2. Chronic combined systolic and diastolic heart failure (HCC)  I50.42 sacubitril-valsartan (ENTRESTO)  49-51 MG  3. Ischemic cardiomyopathy  I25.5 PCV ECHOCARDIOGRAM COMPLETE  4. ICD (implantable cardioverter-defibrillator) in place  Z95.810   5. Tobacco use disorder, continuous  F17.209 nicotine (NICODERM CQ) 14 mg/24hr patch    Meds ordered this encounter  Medications  .  ticagrelor (BRILINTA) 90 MG TABS tablet    Sig: Take 1 tablet (90 mg total) by mouth 2 (two) times daily.    Dispense:  180 tablet    Refill:  1  . sacubitril-valsartan (ENTRESTO) 49-51 MG    Sig: Take 1 tablet by mouth 2 (two) times daily.    Dispense:  180 tablet    Refill:  3  . nicotine (NICODERM CQ) 14 mg/24hr patch    Sig: Place 1 patch (14 mg total) onto the skin daily.    Dispense:  28 patch    Refill:  2    Medications Discontinued During This Encounter  Medication Reason  . nicotine (NICODERM CQ - DOSED IN MG/24 HOURS) 14 mg/24hr patch Change in therapy  . nicotine (NICODERM CQ - DOSED IN MG/24 HOURS) 14 mg/24hr patch Change in therapy  . ticagrelor (BRILINTA) 90 MG TABS tablet Reorder  . sacubitril-valsartan (ENTRESTO) 49-51 MG Reorder  . nicotine (NICODERM CQ) 21 mg/24hr patch Reorder    Recommendations:   Azarian Starace.  is a 54 y.o. Caucasian male with lung cancer in remission, ongoing tobacco use disorder, COPD, hypertension, CAD SP LAD stenting when he presented with acute pulmonary edema on 05/05/2019, severe ischemic cardiomyopathy with ejection fraction 25 to 30% SP ICD implantation on 09/10/2019.  He now presents for follow-up.  He still smoking about 5 to 6 units a day, and patient to completely quit and remain abstinent, he is trying his best.  I repaired his nicotine patch but reduced it from 21 mg patch to 14 mg patch for the next 2 to 3 months.  With regard to ischemic cardiomyopathy, he is on maximum dose of beta-blocker therapy, heart rate has now improved to around 70 bpm.  He is also on Entresto moderate dose and in view of stage III chronic kidney disease we will left the dosage at  moderate dose.  I refilled the prescriptions.  Today there is no clinical evidence of heart failure.  He appears to be compliant with his medications.  He has ICD in place, will request Dr. Crissie Sickles to release it back to me for management of heart failure as well. He can resume driving.   As he is on optimal medical therapy, I will repeat echocardiogram in 3 months prior to his next office visit with me.  His external labs were reviewed, lipids are under excellent control, overall I am very pleased with his progress.  Adrian Prows, MD, [facility] 01/18/2020, 3:29 PM [facility] [place] Office: [phone]   CC: Cristopher Peru, MD

## 2020-01-18 ENCOUNTER — Ambulatory Visit: Payer: Medicare Other | Admitting: Cardiology

## 2020-01-18 ENCOUNTER — Other Ambulatory Visit: Payer: Self-pay

## 2020-01-18 ENCOUNTER — Encounter: Payer: Self-pay | Admitting: Cardiology

## 2020-01-18 ENCOUNTER — Telehealth: Payer: Self-pay | Admitting: *Deleted

## 2020-01-18 VITALS — BP 107/71 | HR 81 | Temp 97.7°F | Resp 16 | Ht 71.5 in | Wt 154.0 lb

## 2020-01-18 DIAGNOSIS — I2510 Atherosclerotic heart disease of native coronary artery without angina pectoris: Secondary | ICD-10-CM | POA: Diagnosis not present

## 2020-01-18 DIAGNOSIS — I255 Ischemic cardiomyopathy: Secondary | ICD-10-CM

## 2020-01-18 DIAGNOSIS — Z95810 Presence of automatic (implantable) cardiac defibrillator: Secondary | ICD-10-CM | POA: Diagnosis not present

## 2020-01-18 DIAGNOSIS — I5042 Chronic combined systolic (congestive) and diastolic (congestive) heart failure: Secondary | ICD-10-CM | POA: Diagnosis not present

## 2020-01-18 DIAGNOSIS — F17209 Nicotine dependence, unspecified, with unspecified nicotine-induced disorders: Secondary | ICD-10-CM

## 2020-01-18 DIAGNOSIS — F17210 Nicotine dependence, cigarettes, uncomplicated: Secondary | ICD-10-CM | POA: Diagnosis not present

## 2020-01-18 MED ORDER — TICAGRELOR 90 MG PO TABS: 90.0000 mg | ORAL_TABLET | Freq: Two times a day (BID) | ORAL | 1 refills | Status: DC

## 2020-01-18 MED ORDER — NICOTINE 14 MG/24HR TD PT24: 14.0000 mg | MEDICATED_PATCH | Freq: Every day | TRANSDERMAL | 2 refills | Status: DC

## 2020-01-18 MED ORDER — ENTRESTO 49-51 MG PO TABS: 1.0000 | ORAL_TABLET | Freq: Two times a day (BID) | ORAL | 3 refills | Status: DC

## 2020-01-18 NOTE — Telephone Encounter (Signed)
Spoke with patient. Pt confirms he wishes to transfer [place] monitoring to [facility]. Pt plans to keep f/u with Dr. Lovena Le on 01/23/20.  Merlin released, Dr. Einar Gip made aware.

## 2020-01-18 NOTE — Telephone Encounter (Signed)
-----   Message from Adrian Prows, MD sent at 01/18/2020  3:00 PM EDT ----- Regarding: ICD release Please release his ICD to our clinic. Cecilia please f/u on this  [place]

## 2020-01-23 ENCOUNTER — Other Ambulatory Visit: Payer: Self-pay

## 2020-01-23 ENCOUNTER — Ambulatory Visit (INDEPENDENT_AMBULATORY_CARE_PROVIDER_SITE_OTHER): Payer: Medicare Other | Admitting: Internal Medicine

## 2020-01-23 ENCOUNTER — Encounter: Payer: Self-pay | Admitting: Internal Medicine

## 2020-01-23 VITALS — BP 96/66 | HR 82 | Ht 71.5 in | Wt 154.0 lb

## 2020-01-23 DIAGNOSIS — I255 Ischemic cardiomyopathy: Secondary | ICD-10-CM | POA: Diagnosis not present

## 2020-01-23 DIAGNOSIS — I5042 Chronic combined systolic (congestive) and diastolic (congestive) heart failure: Secondary | ICD-10-CM

## 2020-01-23 NOTE — Progress Notes (Signed)
HPI Jimmy Nelson returns today for ongoing followup. He is a p leasant 54 yo man with an ICM, s/p MI who has class 2 CHF and underwent ICD insertion about 4 months ago. IN the interim, he notes that he has done well with no chest pain or sob or intercurrent ICD therapies.  Allergies  Allergen Reactions  . Bee Venom Anaphylaxis  . Iodinated Diagnostic Agents Anaphylaxis    It is noted that pt is allergic to Iodinated contrast media-iv dye,oral contrast  . Chantix [Varenicline Tartrate]     Seizures      Current Outpatient Medications  Medication Sig Dispense Refill  . acetaminophen (TYLENOL) 325 MG tablet Take 650 mg by mouth every 6 (six) hours as needed for moderate pain or headache.    . albuterol (PROVENTIL HFA;VENTOLIN HFA) 108 (90 BASE) MCG/ACT inhaler Inhale 1 puff into the lungs every 4 (four) hours as needed for wheezing.     Marland Kitchen aspirin 81 MG chewable tablet Chew 81 mg by mouth daily.    Marland Kitchen atorvastatin (LIPITOR) 80 MG tablet Take 1 tablet (80 mg total) by mouth daily at 6 PM. 90 tablet 2  . Cyanocobalamin 1500 MCG TBDP Take 1,500 mcg by mouth daily.    . diclofenac Sodium (VOLTAREN) 1 % GEL Apply 1 application topically 2 (two) times daily.    . divalproex (DEPAKOTE) 250 MG DR tablet Take 250-500 mg by mouth See admin instructions. Tale 250 mg in the morning and 500 mg in the evening    . docusate sodium (COLACE) 100 MG capsule Take 100 mg by mouth 2 (two) times daily.    . ferrous sulfate 325 (65 FE) MG tablet Take 1 tablet (325 mg total) by mouth daily with breakfast. 30 tablet 0  . gabapentin (NEURONTIN) 600 MG tablet Take 600 mg by mouth 3 (three) times daily.    . hydrOXYzine (ATARAX/VISTARIL) 25 MG tablet Take 1 tablet (25 mg total) by mouth every 8 (eight) hours as needed for anxiety. 8 tablet 0  . ibuprofen (ADVIL) 200 MG tablet Take 800 mg by mouth every 8 (eight) hours as needed for headache or moderate pain.    Marland Kitchen ipratropium-albuterol (DUONEB) 0.5-2.5 (3) MG/3ML  SOLN Inhale 3 mLs into the lungs every 6 (six) hours as needed (shortness of breath).     . metoprolol succinate (TOPROL-XL) 100 MG 24 hr tablet Take 1 tablet (100 mg total) by mouth 2 (two) times daily. Take with or immediately following a meal. 180 tablet 1  . montelukast (SINGULAIR) 10 MG tablet Take 10 mg by mouth daily.    . Multiple Vitamins-Minerals (MULTIVITAMIN WITH MINERALS) tablet Take 1 tablet by mouth daily. 30 tablet 0  . nicotine (NICODERM CQ) 14 mg/24hr patch Place 1 patch (14 mg total) onto the skin daily. 28 patch 2  . OXYGEN Inhale 2.5 L into the lungs.    Marland Kitchen oxymetazoline (AFRIN) 0.05 % nasal spray Place 1 spray into both nostrils 2 (two) times daily as needed for congestion.    Marland Kitchen pyridOXINE (VITAMIN B-6) 100 MG tablet Take 100 mg by mouth daily.    . sacubitril-valsartan (ENTRESTO) 49-51 MG Take 1 tablet by mouth 2 (two) times daily. 180 tablet 3  . SYMBICORT 160-4.5 MCG/ACT inhaler Inhale 2 puffs into the lungs 2 (two) times daily.     . tadalafil (CIALIS) 20 MG tablet Take 20 mg by mouth daily as needed for erectile dysfunction.    . ticagrelor (BRILINTA)  90 MG TABS tablet Take 1 tablet (90 mg total) by mouth 2 (two) times daily. 180 tablet 1  . traZODone (DESYREL) 100 MG tablet Take 200 mg by mouth at bedtime.     . nitroGLYCERIN (NITROSTAT) 0.4 MG SL tablet Place 1 tablet (0.4 mg total) under the tongue every 5 (five) minutes as needed for up to 25 days for chest pain. 25 tablet 3   No current facility-administered medications for this visit.     Past Medical History:  Diagnosis Date  . Amnesia   . Anemia   . COPD (chronic obstructive pulmonary disease) ([place])   . Crohn's disease ([place]) Deteriorating disk  . Depression   . History of MI (myocardial infarction) 05/05/2019  . History of stroke    2  . Hyperlipidemia   . Hypertension   . Lung cancer ([place])   . Mental disorder   . Sleep apnea   . Stroke ([place])     ROS:   All systems reviewed and negative except  as noted in the HPI.   Past Surgical History:  Procedure Laterality Date  . APPENDECTOMY    . CORONARY/GRAFT ACUTE MI REVASCULARIZATION N/A 05/05/2019   Procedure: Coronary/Graft Acute MI Revascularization;  Surgeon: Adrian Prows, MD;  Location: [place] [facility];  Service: Cardiovascular;  Laterality: N/A;  . EYE SURGERY    . ICD IMPLANT N/A 09/10/2019   Procedure: ICD IMPLANT;  Surgeon: Evans Lance, MD;  Location: [place] [facility];  Service: Cardiovascular;  Laterality: N/A;  . intestestine for chrohns disease    . LEFT HEART CATH AND CORONARY ANGIOGRAPHY N/A 05/05/2019   Procedure: LEFT HEART CATH AND CORONARY ANGIOGRAPHY;  Surgeon: Adrian Prows, MD;  Location: [place] [facility];  Service: Cardiovascular;  Laterality: N/A;  . PORT-A-CATH REMOVAL       History reviewed. No pertinent family history.   Social History   Socioeconomic History  . Marital status: Legally Separated    Spouse name: Not on file  . Number of children: 5  . Years of education: Not on file  . Highest education level: Not on file  Occupational History  . Not on file  Tobacco Use  . Smoking status: Current Every Day Smoker    Types: Cigarettes  . Smokeless tobacco: Current User    Types: Snuff  . Tobacco comment: 3 cigarettes daily   Substance and Sexual Activity  . Alcohol use: Not Currently  . Drug use: No  . Sexual activity: Yes  Other Topics Concern  . Not on file  Social History Narrative  . Not on file   Social Determinants of Health   Financial Resource Strain: High Risk  . Difficulty of Paying Living Expenses: Hard  Food Insecurity: Food Insecurity Present  . Worried About Charity fundraiser in the Last Year: Sometimes true  . Ran Out of Food in the Last Year: Sometimes true  Transportation Needs: Unmet Transportation Needs  . Lack of Transportation (Medical): No  . Lack of Transportation (Non-Medical): Yes  Physical Activity:   . Days of Exercise per Week:   . Minutes of  Exercise per Session:   Stress:   . Feeling of Stress :   Social Connections:   . Frequency of Communication with Friends and Family:   . Frequency of Social Gatherings with Friends and Family:   . Attends Religious Services:   . Active Member of Clubs or Organizations:   . Attends Archivist Meetings:   .  Marital Status:   Intimate Partner Violence:   . Fear of Current or Ex-Partner:   . Emotionally Abused:   Marland Kitchen Physically Abused:   . Sexually Abused:      BP 96/66   Pulse 82   Ht 5' 11.5" (1.816 m)   Wt 154 lb (69.9 kg)   SpO2 97%   BMI 21.18 kg/m   Physical Exam:  Well appearing NAD HEENT: Unremarkable Neck:  No JVD, no thyromegally Lymphatics:  No adenopathy Back:  No CVA tenderness Lungs:  Clear with no wheezes HEART:  Regular rate rhythm, no murmurs, no rubs, no clicks Abd:  soft, positive bowel sounds, no organomegally, no rebound, no guarding Ext:  2 plus pulses, no edema, no cyanosis, no clubbing Skin:  No rashes no nodules Neuro:  CN II through XII intact, motor grossly intact  DEVICE  Normal device function.  See PaceArt for details.   Assess/Plan: 1. Chronic systolic heart failure - his symptoms are class 2. He is encouraged to maintain a low sodium diet. He will continue his current meds. 2. ICD - his [employer] single chamber ICD is working normally.  3. ICM - he denies anginal symptoms. He will continue his current meds.  4. Disp. - he will followup with Dr. Einar Gip. I will be available as needed for any trouble shooting.  Mikle Bosworth.D.

## 2020-01-23 NOTE — Patient Instructions (Addendum)
Medication Instructions:  Your physician recommends that you continue on your current medications as directed. Please refer to the Current Medication list given to you today.  Labwork: None ordered.  Testing/Procedures: None ordered.  Follow-Up: Your physician wants you to follow-up in: as needed with Dr. Lovena Le.      Any Other Special Instructions Will Be Listed Below (If Applicable).  If you need a refill on your cardiac medications before your next appointment, please call your pharmacy.

## 2020-01-25 DIAGNOSIS — K5090 Crohn's disease, unspecified, without complications: Secondary | ICD-10-CM | POA: Diagnosis not present

## 2020-01-25 DIAGNOSIS — E1151 Type 2 diabetes mellitus with diabetic peripheral angiopathy without gangrene: Secondary | ICD-10-CM | POA: Diagnosis not present

## 2020-01-25 DIAGNOSIS — J449 Chronic obstructive pulmonary disease, unspecified: Secondary | ICD-10-CM | POA: Diagnosis not present

## 2020-01-25 DIAGNOSIS — E1129 Type 2 diabetes mellitus with other diabetic kidney complication: Secondary | ICD-10-CM | POA: Diagnosis not present

## 2020-01-26 DIAGNOSIS — C3490 Malignant neoplasm of unspecified part of unspecified bronchus or lung: Secondary | ICD-10-CM | POA: Diagnosis not present

## 2020-01-26 DIAGNOSIS — J449 Chronic obstructive pulmonary disease, unspecified: Secondary | ICD-10-CM | POA: Diagnosis not present

## 2020-01-30 LAB — CUP PACEART INCLINIC DEVICE CHECK
Battery Remaining Longevity: 96 mo
Brady Statistic RV Percent Paced: 1 % — CL
Date Time Interrogation Session: 20210428170548
HighPow Impedance: 69 Ohm
Implantable Lead Implant Date: 20201214
Implantable Lead Location: 753860
Implantable Pulse Generator Implant Date: 20201214
Lead Channel Impedance Value: 530 Ohm
Lead Channel Pacing Threshold Amplitude: 0.75 V
Lead Channel Pacing Threshold Pulse Width: 0.5 ms
Lead Channel Sensing Intrinsic Amplitude: 12 mV
Pulse Gen Serial Number: 111013076

## 2020-02-04 DIAGNOSIS — G629 Polyneuropathy, unspecified: Secondary | ICD-10-CM | POA: Diagnosis not present

## 2020-02-04 DIAGNOSIS — Z6822 Body mass index (BMI) 22.0-22.9, adult: Secondary | ICD-10-CM | POA: Diagnosis not present

## 2020-02-13 DIAGNOSIS — Z961 Presence of intraocular lens: Secondary | ICD-10-CM | POA: Diagnosis not present

## 2020-02-20 DIAGNOSIS — H5213 Myopia, bilateral: Secondary | ICD-10-CM | POA: Diagnosis not present

## 2020-02-25 DIAGNOSIS — J449 Chronic obstructive pulmonary disease, unspecified: Secondary | ICD-10-CM | POA: Diagnosis not present

## 2020-02-25 DIAGNOSIS — E1151 Type 2 diabetes mellitus with diabetic peripheral angiopathy without gangrene: Secondary | ICD-10-CM | POA: Diagnosis not present

## 2020-02-25 DIAGNOSIS — E1129 Type 2 diabetes mellitus with other diabetic kidney complication: Secondary | ICD-10-CM | POA: Diagnosis not present

## 2020-02-25 DIAGNOSIS — K5090 Crohn's disease, unspecified, without complications: Secondary | ICD-10-CM | POA: Diagnosis not present

## 2020-02-26 DIAGNOSIS — C3490 Malignant neoplasm of unspecified part of unspecified bronchus or lung: Secondary | ICD-10-CM | POA: Diagnosis not present

## 2020-02-26 DIAGNOSIS — J449 Chronic obstructive pulmonary disease, unspecified: Secondary | ICD-10-CM | POA: Diagnosis not present

## 2020-03-11 ENCOUNTER — Ambulatory Visit (INDEPENDENT_AMBULATORY_CARE_PROVIDER_SITE_OTHER): Payer: Medicare Other | Admitting: *Deleted

## 2020-03-11 DIAGNOSIS — I428 Other cardiomyopathies: Secondary | ICD-10-CM

## 2020-03-11 LAB — CUP PACEART REMOTE DEVICE CHECK
Battery Remaining Longevity: 103 mo
Battery Remaining Percentage: 94 %
Battery Voltage: 3.01 V
Brady Statistic RV Percent Paced: 0 %
Date Time Interrogation Session: 20210615062828
HighPow Impedance: 68 Ohm
Implantable Lead Implant Date: 20201214
Implantable Lead Location: 753860
Implantable Pulse Generator Implant Date: 20201214
Lead Channel Impedance Value: 530 Ohm
Lead Channel Pacing Threshold Amplitude: 0.5 V
Lead Channel Pacing Threshold Pulse Width: 0.5 ms
Lead Channel Sensing Intrinsic Amplitude: 12 mV
Lead Channel Setting Pacing Amplitude: 2.5 V
Lead Channel Setting Pacing Pulse Width: 0.5 ms
Lead Channel Setting Sensing Sensitivity: 0.5 mV
Pulse Gen Serial Number: 111013076

## 2020-03-12 NOTE — Progress Notes (Signed)
Remote ICD transmission.   

## 2020-03-26 DIAGNOSIS — K5090 Crohn's disease, unspecified, without complications: Secondary | ICD-10-CM | POA: Diagnosis not present

## 2020-03-26 DIAGNOSIS — E1151 Type 2 diabetes mellitus with diabetic peripheral angiopathy without gangrene: Secondary | ICD-10-CM | POA: Diagnosis not present

## 2020-03-26 DIAGNOSIS — E1129 Type 2 diabetes mellitus with other diabetic kidney complication: Secondary | ICD-10-CM | POA: Diagnosis not present

## 2020-03-26 DIAGNOSIS — J449 Chronic obstructive pulmonary disease, unspecified: Secondary | ICD-10-CM | POA: Diagnosis not present

## 2020-03-27 DIAGNOSIS — C3490 Malignant neoplasm of unspecified part of unspecified bronchus or lung: Secondary | ICD-10-CM | POA: Diagnosis not present

## 2020-03-27 DIAGNOSIS — Z79899 Other long term (current) drug therapy: Secondary | ICD-10-CM | POA: Diagnosis not present

## 2020-03-27 DIAGNOSIS — J449 Chronic obstructive pulmonary disease, unspecified: Secondary | ICD-10-CM | POA: Diagnosis not present

## 2020-03-31 IMAGING — DX PORTABLE CHEST - 1 VIEW
1 series · 1 of 1 positions shown · non-contrast
Comparison: 05/05/2019

CLINICAL DATA: Shortness of breath.

EXAM:
PORTABLE CHEST 1 VIEW

[chest ap]
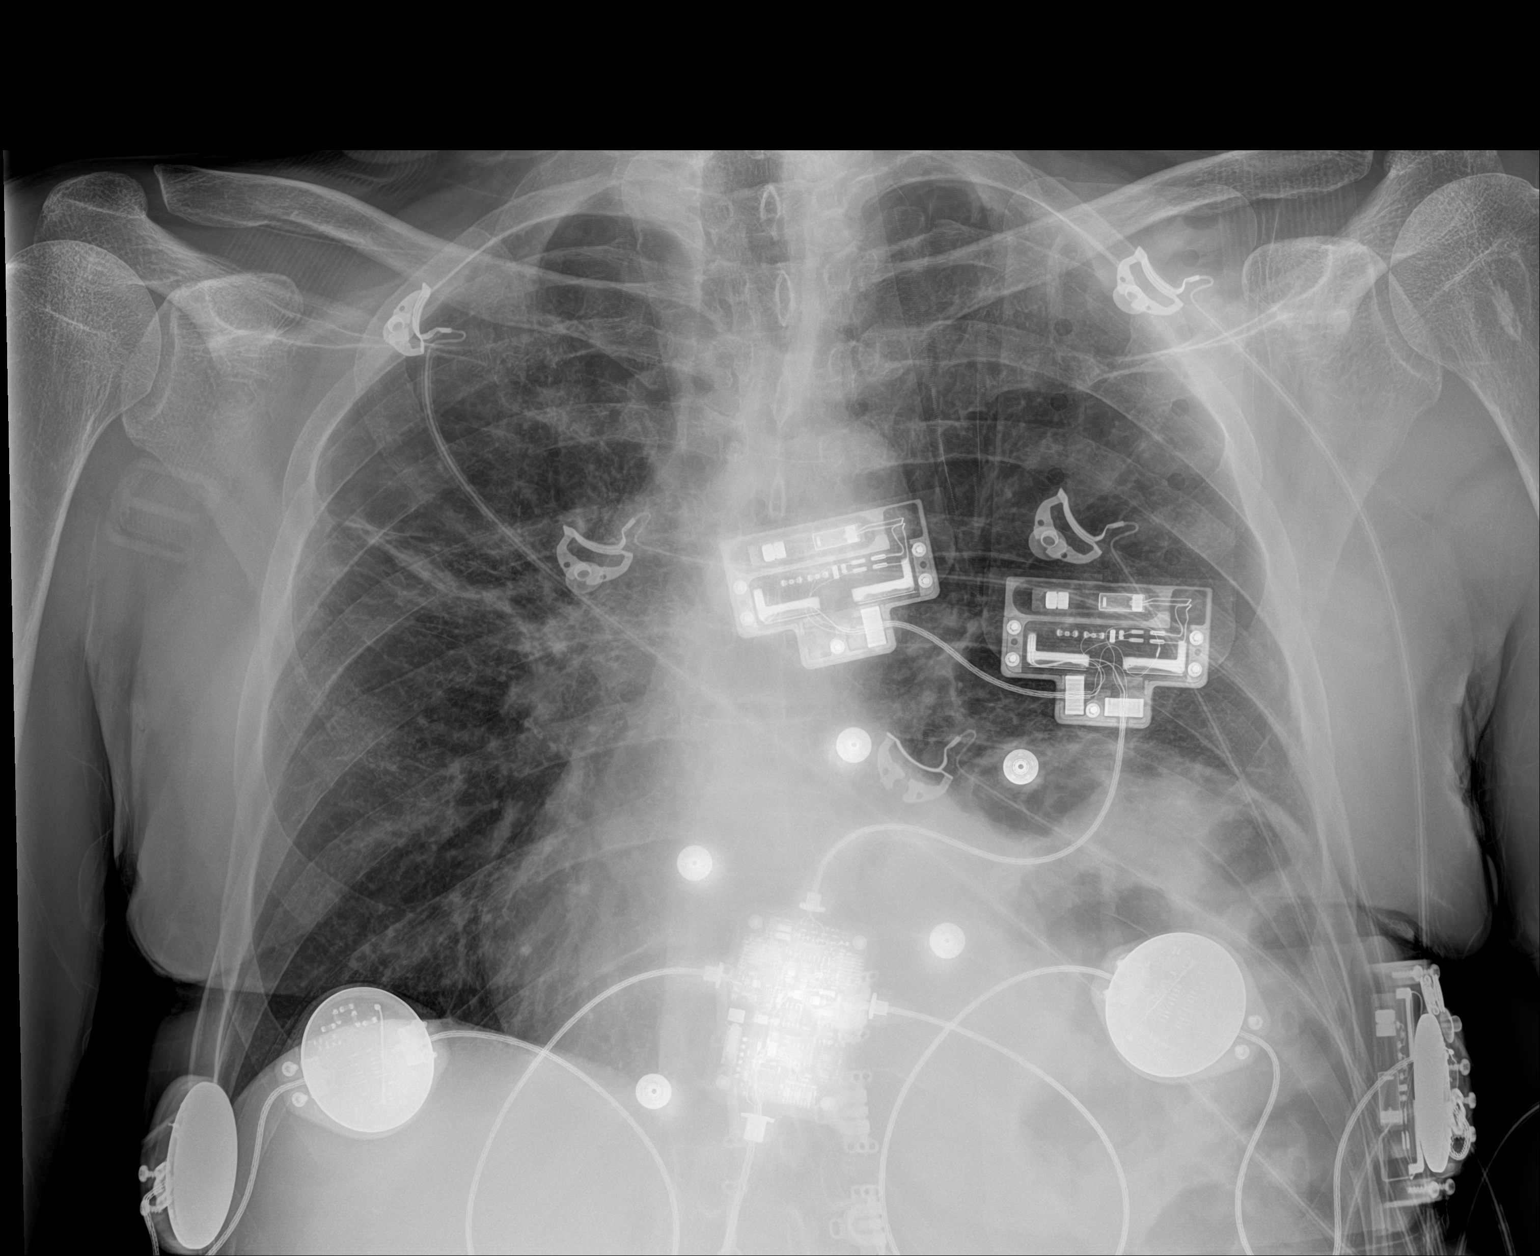

[1 of 1 positions shown; findings below may reference images not displayed]

FINDINGS: The heart size and pulmonary vascularity are normal. Chronic
elevation of the left hemidiaphragm. Scarring in the right midzone.
No discrete infiltrates or effusions. No acute bone abnormality.
IMPRESSION: No acute cardiopulmonary disease. Chronic elevation of the left
hemidiaphragm.

## 2020-04-10 ENCOUNTER — Other Ambulatory Visit: Payer: Self-pay

## 2020-04-10 ENCOUNTER — Ambulatory Visit: Payer: Medicare Other

## 2020-04-10 DIAGNOSIS — I255 Ischemic cardiomyopathy: Secondary | ICD-10-CM | POA: Diagnosis not present

## 2020-04-10 DIAGNOSIS — I2510 Atherosclerotic heart disease of native coronary artery without angina pectoris: Secondary | ICD-10-CM | POA: Diagnosis not present

## 2020-04-14 ENCOUNTER — Encounter: Payer: Self-pay | Admitting: Cardiology

## 2020-04-14 ENCOUNTER — Telehealth: Payer: Self-pay | Admitting: Cardiology

## 2020-04-14 DIAGNOSIS — Z01810 Encounter for preprocedural cardiovascular examination: Secondary | ICD-10-CM | POA: Insufficient documentation

## 2020-04-14 DIAGNOSIS — I5022 Chronic systolic (congestive) heart failure: Secondary | ICD-10-CM

## 2020-04-14 DIAGNOSIS — Z4502 Encounter for adjustment and management of automatic implantable cardiac defibrillator: Secondary | ICD-10-CM

## 2020-04-14 HISTORY — DX: Chronic systolic (congestive) heart failure: I50.22

## 2020-04-14 HISTORY — DX: Encounter for adjustment and management of automatic implantable cardiac defibrillator: Z45.02

## 2020-04-15 NOTE — Telephone Encounter (Signed)
Received

## 2020-04-18 ENCOUNTER — Other Ambulatory Visit: Payer: Medicare Other

## 2020-04-24 ENCOUNTER — Ambulatory Visit: Payer: Medicare Other | Admitting: Cardiology

## 2020-04-27 DIAGNOSIS — C3490 Malignant neoplasm of unspecified part of unspecified bronchus or lung: Secondary | ICD-10-CM | POA: Diagnosis not present

## 2020-04-27 DIAGNOSIS — J449 Chronic obstructive pulmonary disease, unspecified: Secondary | ICD-10-CM | POA: Diagnosis not present

## 2020-04-27 DIAGNOSIS — E1129 Type 2 diabetes mellitus with other diabetic kidney complication: Secondary | ICD-10-CM | POA: Diagnosis not present

## 2020-04-27 DIAGNOSIS — K5090 Crohn's disease, unspecified, without complications: Secondary | ICD-10-CM | POA: Diagnosis not present

## 2020-04-27 DIAGNOSIS — E1151 Type 2 diabetes mellitus with diabetic peripheral angiopathy without gangrene: Secondary | ICD-10-CM | POA: Diagnosis not present

## 2020-04-29 ENCOUNTER — Other Ambulatory Visit: Payer: Self-pay | Admitting: Cardiology

## 2020-04-29 DIAGNOSIS — F17209 Nicotine dependence, unspecified, with unspecified nicotine-induced disorders: Secondary | ICD-10-CM

## 2020-04-29 DIAGNOSIS — I2510 Atherosclerotic heart disease of native coronary artery without angina pectoris: Secondary | ICD-10-CM

## 2020-05-15 DIAGNOSIS — Z4502 Encounter for adjustment and management of automatic implantable cardiac defibrillator: Secondary | ICD-10-CM | POA: Diagnosis not present

## 2020-05-15 DIAGNOSIS — I428 Other cardiomyopathies: Secondary | ICD-10-CM | POA: Diagnosis not present

## 2020-05-15 DIAGNOSIS — I2109 ST elevation (STEMI) myocardial infarction involving other coronary artery of anterior wall: Secondary | ICD-10-CM | POA: Diagnosis not present

## 2020-05-15 DIAGNOSIS — I5022 Chronic systolic (congestive) heart failure: Secondary | ICD-10-CM | POA: Diagnosis not present

## 2020-05-15 DIAGNOSIS — Z95810 Presence of automatic (implantable) cardiac defibrillator: Secondary | ICD-10-CM | POA: Diagnosis not present

## 2020-05-26 DIAGNOSIS — I110 Hypertensive heart disease with heart failure: Secondary | ICD-10-CM | POA: Diagnosis not present

## 2020-05-26 DIAGNOSIS — R252 Cramp and spasm: Secondary | ICD-10-CM | POA: Diagnosis not present

## 2020-05-26 DIAGNOSIS — Z6821 Body mass index (BMI) 21.0-21.9, adult: Secondary | ICD-10-CM | POA: Diagnosis not present

## 2020-05-26 DIAGNOSIS — J449 Chronic obstructive pulmonary disease, unspecified: Secondary | ICD-10-CM | POA: Diagnosis not present

## 2020-05-26 DIAGNOSIS — N183 Chronic kidney disease, stage 3 unspecified: Secondary | ICD-10-CM | POA: Diagnosis not present

## 2020-05-27 DIAGNOSIS — J449 Chronic obstructive pulmonary disease, unspecified: Secondary | ICD-10-CM | POA: Diagnosis not present

## 2020-05-27 DIAGNOSIS — N183 Chronic kidney disease, stage 3 unspecified: Secondary | ICD-10-CM | POA: Diagnosis not present

## 2020-05-27 DIAGNOSIS — R252 Cramp and spasm: Secondary | ICD-10-CM | POA: Diagnosis not present

## 2020-05-27 DIAGNOSIS — K5090 Crohn's disease, unspecified, without complications: Secondary | ICD-10-CM | POA: Diagnosis not present

## 2020-05-27 DIAGNOSIS — I110 Hypertensive heart disease with heart failure: Secondary | ICD-10-CM | POA: Diagnosis not present

## 2020-05-27 DIAGNOSIS — Z6821 Body mass index (BMI) 21.0-21.9, adult: Secondary | ICD-10-CM | POA: Diagnosis not present

## 2020-05-28 DIAGNOSIS — C3490 Malignant neoplasm of unspecified part of unspecified bronchus or lung: Secondary | ICD-10-CM | POA: Diagnosis not present

## 2020-05-28 DIAGNOSIS — I110 Hypertensive heart disease with heart failure: Secondary | ICD-10-CM | POA: Diagnosis not present

## 2020-05-28 DIAGNOSIS — N183 Chronic kidney disease, stage 3 unspecified: Secondary | ICD-10-CM | POA: Diagnosis not present

## 2020-05-28 DIAGNOSIS — J449 Chronic obstructive pulmonary disease, unspecified: Secondary | ICD-10-CM | POA: Diagnosis not present

## 2020-05-28 DIAGNOSIS — K5090 Crohn's disease, unspecified, without complications: Secondary | ICD-10-CM | POA: Diagnosis not present

## 2020-06-08 ENCOUNTER — Emergency Department (HOSPITAL_COMMUNITY)
Admission: EM | Admit: 2020-06-08 | Discharge: 2020-06-08 | Discharge status: Against Medical Advice | Payer: Medicare Other

## 2020-06-08 DIAGNOSIS — I4430 Unspecified atrioventricular block: Secondary | ICD-10-CM | POA: Diagnosis not present

## 2020-06-08 DIAGNOSIS — Z743 Need for continuous supervision: Secondary | ICD-10-CM | POA: Diagnosis not present

## 2020-06-08 DIAGNOSIS — R404 Transient alteration of awareness: Secondary | ICD-10-CM | POA: Diagnosis not present

## 2020-06-08 DIAGNOSIS — R531 Weakness: Secondary | ICD-10-CM | POA: Diagnosis not present

## 2020-06-08 DIAGNOSIS — I499 Cardiac arrhythmia, unspecified: Secondary | ICD-10-CM | POA: Diagnosis not present

## 2020-06-08 NOTE — ED Notes (Signed)
Pt brought in to triage by ems, staff notified pt that he needs to wear a mask. Pt refusing mask and stating he would like to leave. IV taken out and pt seen leaving triage

## 2020-06-13 ENCOUNTER — Ambulatory Visit: Payer: Medicare Other | Admitting: Cardiology

## 2020-06-23 DIAGNOSIS — Z23 Encounter for immunization: Secondary | ICD-10-CM | POA: Diagnosis not present

## 2020-06-23 DIAGNOSIS — Z6821 Body mass index (BMI) 21.0-21.9, adult: Secondary | ICD-10-CM | POA: Diagnosis not present

## 2020-06-23 DIAGNOSIS — J449 Chronic obstructive pulmonary disease, unspecified: Secondary | ICD-10-CM | POA: Diagnosis not present

## 2020-06-27 DIAGNOSIS — K5090 Crohn's disease, unspecified, without complications: Secondary | ICD-10-CM | POA: Diagnosis not present

## 2020-06-27 DIAGNOSIS — N183 Chronic kidney disease, stage 3 unspecified: Secondary | ICD-10-CM | POA: Diagnosis not present

## 2020-06-27 DIAGNOSIS — I110 Hypertensive heart disease with heart failure: Secondary | ICD-10-CM | POA: Diagnosis not present

## 2020-06-27 DIAGNOSIS — C3490 Malignant neoplasm of unspecified part of unspecified bronchus or lung: Secondary | ICD-10-CM | POA: Diagnosis not present

## 2020-06-27 DIAGNOSIS — J449 Chronic obstructive pulmonary disease, unspecified: Secondary | ICD-10-CM | POA: Diagnosis not present

## 2020-07-14 DIAGNOSIS — Z6821 Body mass index (BMI) 21.0-21.9, adult: Secondary | ICD-10-CM | POA: Diagnosis not present

## 2020-07-14 DIAGNOSIS — F32A Depression, unspecified: Secondary | ICD-10-CM | POA: Diagnosis not present

## 2020-07-17 IMAGING — CR DG CHEST 2V
2 series · 2 of 2 positions shown · non-contrast
Comparison: Radiograph 06/30/2019, CT 11/30/2017

CLINICAL DATA: ICD in place. Patient cannot raise right arm. Hx of
COPD, MI, stroke, HTN, AND lung cancer. Pt is a current smoker. Best
obtainable images due to patient cooperation.

EXAM:
CHEST - 2 VIEW

[w chest lat]
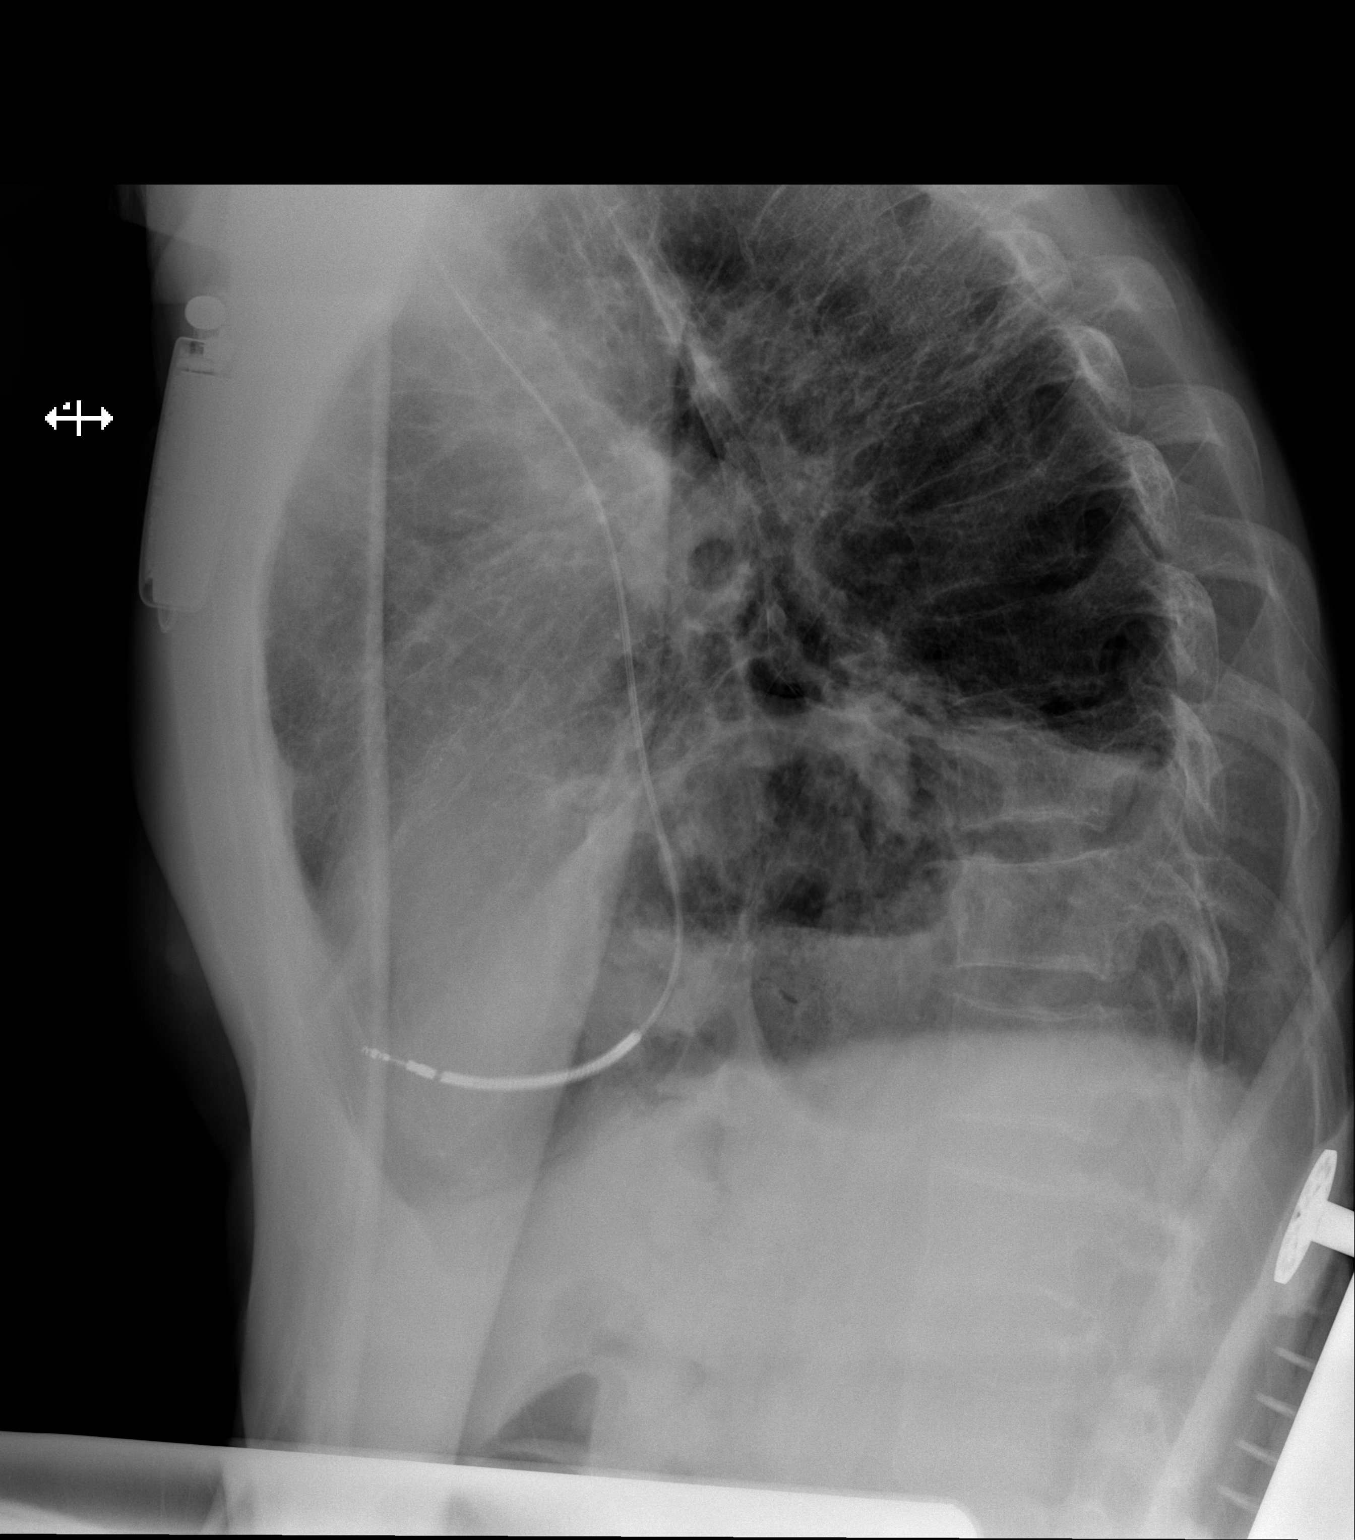

[x chest ap]
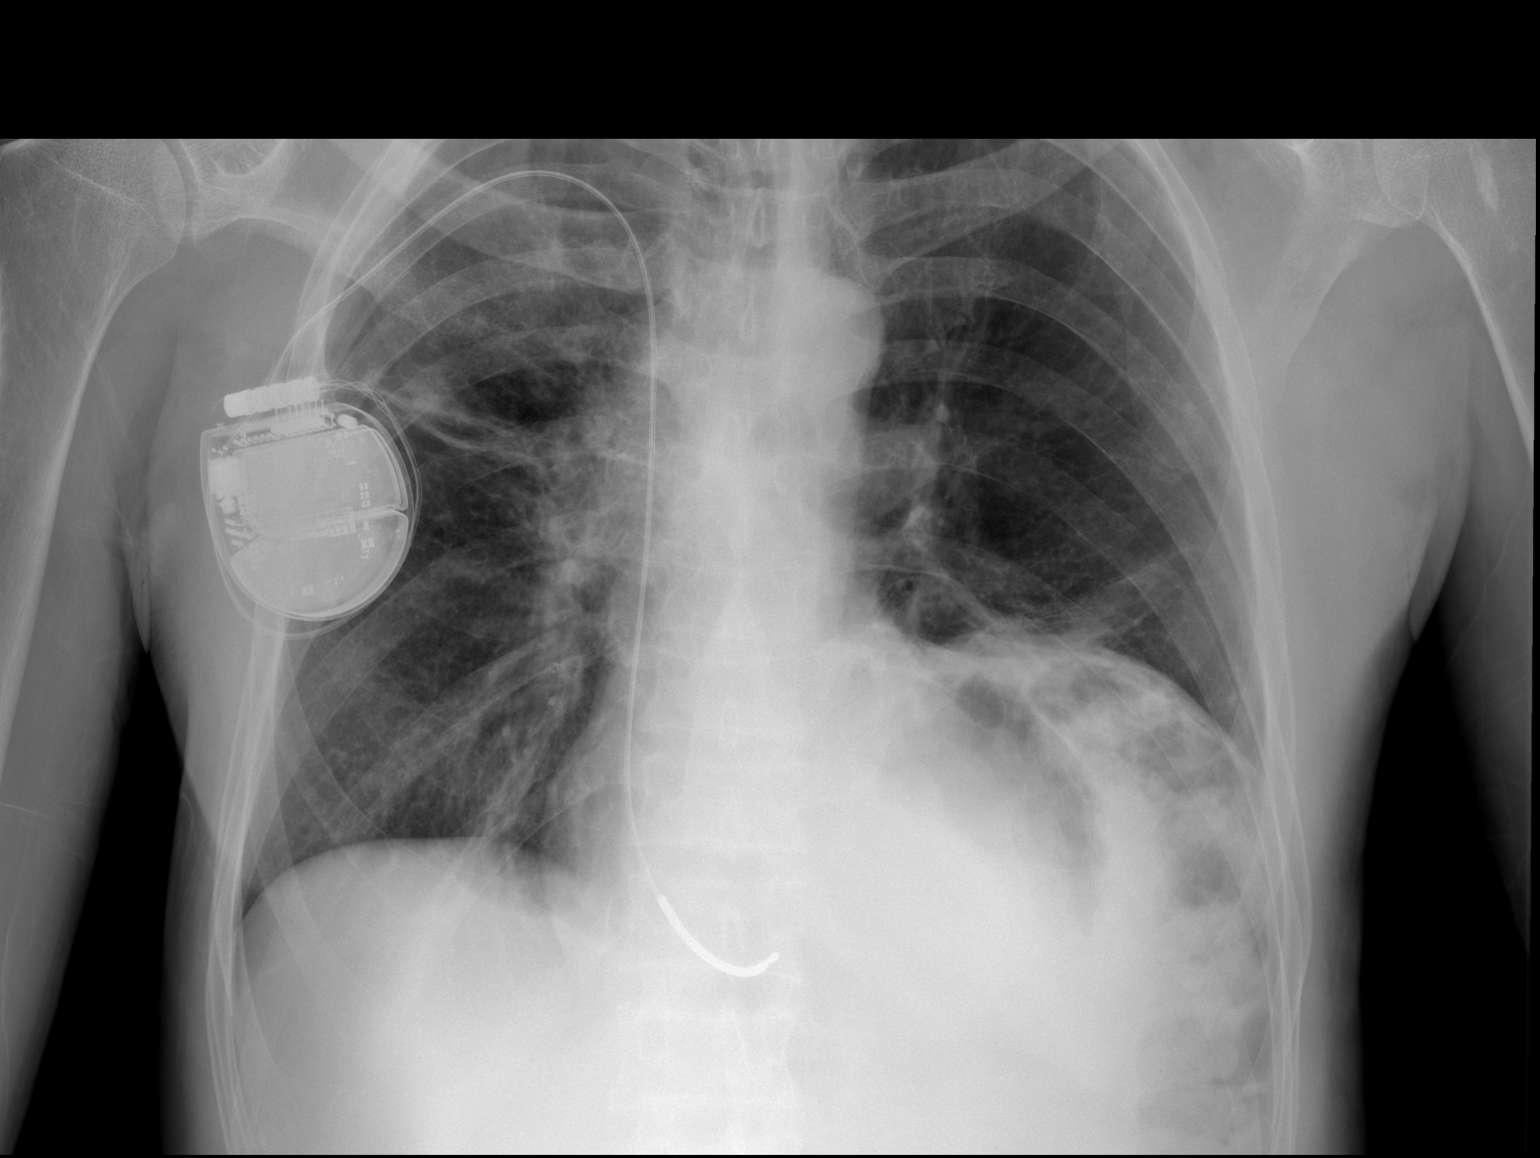

[2 of 2 positions shown; findings below may reference images not displayed]

FINDINGS: RIGHT-sided defibrillator with single continuous lead. No
pneumothorax.

Chronic elevation of the LEFT hemidiaphragm. Mild bibasilar
atelectasis. No focal infiltrate. No pulmonary edema.
IMPRESSION: 1. RIGHT ICD placement.  No complicating features
2. Chronic elevation LEFT hemidiaphragm.

## 2020-07-28 DIAGNOSIS — Z4502 Encounter for adjustment and management of automatic implantable cardiac defibrillator: Secondary | ICD-10-CM | POA: Diagnosis not present

## 2020-07-28 DIAGNOSIS — C3490 Malignant neoplasm of unspecified part of unspecified bronchus or lung: Secondary | ICD-10-CM | POA: Diagnosis not present

## 2020-07-28 DIAGNOSIS — I5022 Chronic systolic (congestive) heart failure: Secondary | ICD-10-CM | POA: Diagnosis not present

## 2020-07-28 DIAGNOSIS — N183 Chronic kidney disease, stage 3 unspecified: Secondary | ICD-10-CM | POA: Diagnosis not present

## 2020-07-28 DIAGNOSIS — J449 Chronic obstructive pulmonary disease, unspecified: Secondary | ICD-10-CM | POA: Diagnosis not present

## 2020-07-28 DIAGNOSIS — K5090 Crohn's disease, unspecified, without complications: Secondary | ICD-10-CM | POA: Diagnosis not present

## 2020-07-28 DIAGNOSIS — I428 Other cardiomyopathies: Secondary | ICD-10-CM | POA: Diagnosis not present

## 2020-07-28 DIAGNOSIS — I110 Hypertensive heart disease with heart failure: Secondary | ICD-10-CM | POA: Diagnosis not present

## 2020-07-28 DIAGNOSIS — Z95810 Presence of automatic (implantable) cardiac defibrillator: Secondary | ICD-10-CM | POA: Diagnosis not present

## 2020-07-28 DIAGNOSIS — I255 Ischemic cardiomyopathy: Secondary | ICD-10-CM | POA: Diagnosis not present

## 2020-08-01 ENCOUNTER — Ambulatory Visit: Payer: Medicare Other | Admitting: Cardiology

## 2020-08-01 ENCOUNTER — Encounter: Payer: Self-pay | Admitting: Cardiology

## 2020-08-01 ENCOUNTER — Other Ambulatory Visit: Payer: Self-pay

## 2020-08-01 VITALS — BP 104/69 | HR 76 | Resp 16 | Ht 71.0 in | Wt 151.0 lb

## 2020-08-01 DIAGNOSIS — I25118 Atherosclerotic heart disease of native coronary artery with other forms of angina pectoris: Secondary | ICD-10-CM | POA: Diagnosis not present

## 2020-08-01 DIAGNOSIS — I255 Ischemic cardiomyopathy: Secondary | ICD-10-CM

## 2020-08-01 DIAGNOSIS — N1831 Chronic kidney disease, stage 3a: Secondary | ICD-10-CM | POA: Diagnosis not present

## 2020-08-01 DIAGNOSIS — F17209 Nicotine dependence, unspecified, with unspecified nicotine-induced disorders: Secondary | ICD-10-CM

## 2020-08-01 DIAGNOSIS — Z95810 Presence of automatic (implantable) cardiac defibrillator: Secondary | ICD-10-CM

## 2020-08-01 DIAGNOSIS — I2510 Atherosclerotic heart disease of native coronary artery without angina pectoris: Secondary | ICD-10-CM | POA: Diagnosis not present

## 2020-08-01 DIAGNOSIS — I5042 Chronic combined systolic (congestive) and diastolic (congestive) heart failure: Secondary | ICD-10-CM | POA: Diagnosis not present

## 2020-08-01 DIAGNOSIS — F17210 Nicotine dependence, cigarettes, uncomplicated: Secondary | ICD-10-CM | POA: Diagnosis not present

## 2020-08-01 MED ORDER — IVABRADINE HCL 5 MG PO TABS: 5.0000 mg | ORAL_TABLET | Freq: Two times a day (BID) | ORAL | 3 refills | Status: DC

## 2020-08-01 NOTE — Progress Notes (Signed)
Primary Physician/Referring:  Maryella Shivers, MD  Patient ID: Jimmy Ochs., male    DOB: June 28, 1966, 54 y.o.   MRN: [id number]  Chief Complaint  Patient presents with  . Coronary Artery Disease  . Congestive Heart Failure  . Follow-up    3 month   HPI:    Jimmy Leite.  is a 54 y.o. Caucasian male with lung cancer in remission, ongoing tobacco use disorder, COPD on continuous home O2 at 2.5L/min, hypertension, CAD SP LAD stenting when he presented with acute pulmonary edema on 05/05/2019, severe ischemic cardiomyopathy with ejection fraction 25 to 30% SP ICD implantation on 09/10/2019.    Patient presents for 54-monthfollow-up.  Since last visit he has had 2 episodes of chest pain while he was working on his car.  Both were quickly relieved with use of sublingual nitroglycerin.  He is currently walking approximately 1/2 mile 2-3 days/week.  He does report fatigue after his walks.  He has chronic dyspnea, which he reports is stable.  He denies PND, orthopnea, leg swelling, claudication.  He continues to tolerate his medications well.  Unfortunately he continues to smoke 7 cigarettes each day.    Past Medical History:  Diagnosis Date  . Amnesia   . Anemia   . Chronic systolic heart failure ([place] 04/14/2020  . COPD (chronic obstructive pulmonary disease) ([facility]   . Crohn's disease ([facility] D[facility]ior[facility]  . Depression   . Encounter for assessment of implantable cardioverter-defibrillator (ICD) 04/14/2020  . History of MI (myocardial infarction) 05/05/2019  . History of stroke    2  . Hyperlipidemia   . Hypertension   . ICD - Single chamber [employer] GALLANT VR ICD 09/10/2019 09/10/2019  . Lung cancer ([facility]   . Mental disorder   . Sleep apnea   . Stroke ([facility]    Past Surgical History:  Procedure Laterality Date  . APPENDECTOMY    . CORONARY/GRAFT ACUTE MI REVASCULARIZATION N/A 05/05/2019   Procedure: Coronary/Graft Acute MI Revascularization;  Surgeon: GAdrian Prows MD;  [facility];  Service: Cardiovascular;  Laterality: N/A;  . EYE SURGERY    . ICD IMPLANT N/A 09/10/2019   Procedure: ICD IMPLANT;  Surgeon: TEvans Lance MD;  [facility];  Service: Cardiovascular;  Laterality: N/A;  . intestestine for chrohns disease    . LEFT HEART CATH AND CORONARY ANGIOGRAPHY N/A 05/05/2019   Procedure: LEFT HEART CATH AND CORONARY ANGIOGRAPHY;  Surgeon: GAdrian Prows MD;  [facility];  Service: Cardiovascular;  Laterality: N/A;  . PORT-A-CATH REMOVAL     Social History   Tobacco Use  . Smoking status: Current Every Day Smoker    Types: Cigarettes  . Smokeless tobacco: Current User    Types: Snuff  . Tobacco comment: 3 cigarettes daily   Substance Use Topics  . Alcohol use: Not Currently   Marital Status: Legally Separated  ROS  Review of Systems  Constitutional: Negative for malaise/fatigue and weight gain.  Cardiovascular: Positive for chest pain and dyspnea on exertion (Chronic, stable). Negative for claudication, leg swelling, near-syncope, orthopnea, palpitations, paroxysmal nocturnal dyspnea and syncope.  Respiratory: Negative for shortness of breath.   Hematologic/Lymphatic: Does not bruise/bleed easily.  Gastrointestinal: Negative for melena.  Neurological: Negative for dizziness and weakness.  Psychiatric/Behavioral: Negative for depression. The patient is not nervous/anxious.    Objective  Blood pressure 104/69, pulse 76, resp. rate 16, height 5' 11"  (1.803 m), weight 151  lb (68.5 kg), SpO2 97 %.  Vitals with BMI 08/01/2020 01/23/2020 01/18/2020  Height 5' 11"  5' 11.5" 5' 11.5"  Weight 151 lbs 154 lbs 154 lbs  BMI 21.07 32.35 57.32  Systolic 202 96 542  Diastolic 69 66 71  Pulse 76 82 81  Some encounter information is confidential and restricted. Go to Review Flowsheets activity to see all data.     Physical Exam Vitals reviewed.  Constitutional:      Appearance: He is cachectic.      Comments: Appears older than stated age   HENT:     Head: Normocephalic and atraumatic.  Neck:     Thyroid: No thyromegaly.     Vascular: No JVD.  Cardiovascular:     Rate and Rhythm: Normal rate and regular rhythm.     Pulses: Intact distal pulses.     Heart sounds: S1 normal and S2 normal. Heart sounds are distant. No murmur heard.  No gallop.   Pulmonary:     Effort: Pulmonary effort is normal. Tachypnea present. No respiratory distress.     Breath sounds: Wheezing present. No decreased breath sounds, rhonchi or rales.  Musculoskeletal:     Right lower leg: No edema.     Left lower leg: No edema.  Neurological:     Mental Status: He is alert.    Laboratory examination:   Recent Labs    08/22/19 1207 01/09/20 1118  NA 144 143  K 3.7 4.6  CL 109* 104  CO2 21 19*  GLUCOSE 64* 87  BUN 13 13  CREATININE 1.45* 1.56*  CALCIUM 9.3 9.8  GFRNONAA 55* 50*  GFRAA 63 58*   CrCl cannot be calculated (Patient's most recent lab result is older than the maximum 21 days allowed.).  CMP Latest Ref Rng & Units 01/09/2020 08/22/2019 05/25/2019  Glucose 65 - 99 mg/dL 87 64(L) 93  BUN 6 - 24 mg/dL 13 13 10   Creatinine 0.76 - 1.27 mg/dL 1.56(H) 1.45(H) 1.58(H)  Sodium 134 - 144 mmol/L 143 144 139  Potassium 3.5 - 5.2 mmol/L 4.6 3.7 3.1(L)  Chloride 96 - 106 mmol/L 104 109(H) 106  CO2 20 - 29 mmol/L 19(L) 21 25  Calcium 8.7 - 10.2 mg/dL 9.8 9.3 8.5(L)  Total Protein 6.0 - 8.5 g/dL 7.3 - 6.2(L)  Total Bilirubin 0.0 - 1.2 mg/dL 0.4 - 0.5  Alkaline Phos 39 - 117 IU/L 89 - 61  AST 0 - 40 IU/L 33 - 21  ALT 0 - 44 IU/L 14 - 24   CBC Latest Ref Rng & Units 08/22/2019 05/25/2019 05/06/2019  WBC 3.4 - 10.8 x10E3/uL 8.1 12.3(H) 20.6(H)  Hemoglobin 13.0 - 17.7 g/dL 15.1 13.5 16.4  Hematocrit 37.5 - 51.0 % 46.2 42.8 51.2  Platelets 150 - 450 x10E3/uL 310 276 346   Lipid Panel     Component Value Date/Time   CHOL 78 (L) 01/09/2020 1118   TRIG 112 01/09/2020 1118   HDL 28 (L) 01/09/2020 1118    CHOLHDL 2.8 01/09/2020 1118   CHOLHDL 3.5 05/05/2019 0312   VLDL 28 05/05/2019 0312   LDLCALC 29 01/09/2020 1118   HEMOGLOBIN A1C Lab Results  Component Value Date   HGBA1C 5.4 05/05/2019   MPG 108.28 05/05/2019   TSH No results for input(s): TSH in the last 8760 hours.   External labs:  Cholesterol, total 78.000 01/09/2020 HDL 28.000 01/09/2020 LDL 56.000 05/05/2019 Triglycerides 112.000 01/09/2020  A1C 5.400 05/05/2019  Hemoglobin 15.300 G/ 08/22/2019  Creatinine, Serum  1.560 01/09/2020 Potassium 4.600 01/09/2020 ALT (SGPT) 14.000 01/09/2020   Medications and allergies   Allergies  Allergen Reactions  . Bee Venom Anaphylaxis  . Iodinated Diagnostic Agents Anaphylaxis    It is noted that pt is allergic to Iodinated contrast media-iv dye,oral contrast  . Chantix [Varenicline Tartrate]     Seizures      Current Outpatient Medications  Medication Instructions  . acetaminophen (TYLENOL) 650 mg, Oral, Every 6 hours PRN  . albuterol (PROVENTIL HFA;VENTOLIN HFA) 108 (90 BASE) MCG/ACT inhaler 1 puff, Inhalation, Every 4 hours PRN  . aspirin 81 mg, Oral, Daily  . atorvastatin (LIPITOR) 80 mg, Oral, Daily-1800  . Cyanocobalamin 1,500 mcg, Oral, Daily  . diclofenac Sodium (VOLTAREN) 1 % GEL 1 application, Topical, 2 times daily  . divalproex (DEPAKOTE) 250-500 mg, Oral, See admin instructions, Tale 250 mg in the morning and 500 mg in the evening  . docusate sodium (COLACE) 100 mg, Oral, 2 times daily  . ferrous sulfate 325 mg, Oral, Daily with breakfast  . gabapentin (NEURONTIN) 600 mg, Oral, 3 times daily  . hydrOXYzine (ATARAX/VISTARIL) 25 mg, Oral, Every 8 hours PRN  . ibuprofen (ADVIL) 800 mg, Oral, Every 8 hours PRN  . ipratropium-albuterol (DUONEB) 0.5-2.5 (3) MG/3ML SOLN 3 mLs, Inhalation, Every 6 hours PRN  . ivabradine (CORLANOR) 5 mg, Oral, 2 times daily with meals  . metoprolol succinate (TOPROL-XL) 100 mg, Oral, 2 times daily, Take with or immediately following a  meal.  . montelukast (SINGULAIR) 10 mg, Oral, Daily  . Multiple Vitamins-Minerals (MULTIVITAMIN WITH MINERALS) tablet 1 tablet, Oral, Daily  . nicotine (NICODERM CQ - DOSED IN MG/24 HOURS) 14 mg, Transdermal, Daily  . nitroGLYCERIN (NITROSTAT) 0.4 mg, Sublingual, Every 5 min PRN  . OXYGEN 2.5 L, Inhalation  . oxymetazoline (AFRIN) 0.05 % nasal spray 1 spray, Each Nare, 2 times daily PRN  . pyridOXINE (VITAMIN B-6) 100 mg, Oral, Daily  . sacubitril-valsartan (ENTRESTO) 49-51 MG 1 tablet, Oral, 2 times daily  . SYMBICORT 160-4.5 MCG/ACT inhaler 2 puffs, Inhalation, 2 times daily  . tadalafil (CIALIS) 20 mg, Oral, Daily PRN  . ticagrelor (BRILINTA) 90 mg, Oral, 2 times daily  . traZODone (DESYREL) 200 mg, Oral, Daily at bedtime    Radiology:  No results found.  Cardiac Studies:   Coronary Angiography 05/05/2019:Prox LAD to Mid LAD lesion is 100% stenosed S/P aspiration thrombectomy followed by overlapping 3.5 x 20 and a 3.0 x 12 mm Synergy DES distally, stenosis reduced from 10 percent to 0%. D1 has ostial 20 to 30% stenosis. Circumflex: Mild disease in the proximal and, OM 2 is very tiny and severely diseased. RCA is dominant, proximal 50% stenosis. LVEDP markedly elevated at 27 mmHg. EF 10 to 15% with anterolateral akinesis. 110 mL contrast utilized, 6.9 minutes of fluoroscopy time. Recommendation: Patient will be kept in the intensive care unit, he may need a hospital admission for 48 hours or more in view of severe LV dysfunction unless he does well clinically. I discussed the findings with his wife on the telephone.  Echocardiogram 07/20/2019: Left ventricle cavity is normal in size. Mild concentric hypertrophy of the left ventricle. Moderate global hypokinesis with distal antero-apical, apical dyskinesis c/w prior myocardial infarction. Severely depressed LV systolic function with visual EF 25-30%. Indeterminate diastolic filling pattern.  No significant valvular  abnormality. Normal right atrial pressure. No significant change compared to previous study on 05/05/2019.   ICD implantation 09/10/2019: CONCLUSIONS:   1. Ischemic cardiomyopathy with chronic [place]  Heart Association class II heart failure.   2. Successful ICD implantation.   3. No early apparent complications.   PCV ECHOCARDIOGRAM COMPLETE 04/10/2020  Narrative Echocardiogram 04/10/2020: Poor visualization of endocardial borders. Left ventricle cavity is normal in size. Moderate concentric hypertrophy of the left ventricle. Severe global hypokinesis. LVEF 25-30%. Indeterminate diastolic filling pattern. IVC is normal with blunted respiratory response. Estimated RA pressure 8 mmHg. No significant change compared to previous study on 05/05/2019.  Scheduled Remote ICD check 05/15/2020: There were 0 VF episodes, 0 VT episodes and 0 episodes of nonsustained ventricular tachycardia. Health trends are stable. Corvue impedance monitoring does not suggest recent fluid accumulation. Battery longevity is 8.5 years. RV pacing is <1.0 %.  EKG   EKG 08/01/2020: Sinus rhythm at a rate of 93 bpm with first degree AV block, left atrial enlargement.  Normal axis.  Poor R wave progression, cannot exclude anterior septal infarct old.  Low voltage complexes.  Compared to EKG 01/18/2020, no significant change.  Assessment     ICD-10-CM   1. Coronary artery disease of native artery of native heart with stable angina pectoris ([place])  I25.118 EKG 12-Lead  2. Ischemic cardiomyopathy  I25.5   3. Chronic combined systolic and diastolic heart failure (HCC)  I50.42   4. ICD (implantable cardioverter-defibrillator) in place  Z95.810   5. Stage 3a chronic kidney disease (HCC)  N18.31   6. Tobacco use disorder, continuous  F17.209     Meds ordered this encounter  Medications  . ivabradine (CORLANOR) 5 MG TABS tablet    Sig: Take 1 tablet (5 mg total) by mouth 2 (two) times daily with a meal.    Dispense:  60  tablet    Refill:  3    There are no discontinued medications.  Recommendations:   Jimmy FECHER Sr.  is a 54 y.o. Caucasian male with lung cancer in remission, ongoing tobacco use disorder, COPD, hypertension, CAD SP LAD stenting when he presented with acute pulmonary edema on 05/05/2019, severe ischemic cardiomyopathy with ejection fraction 25 to 30% SP ICD implantation on 09/10/2019.  Patient presents for 47-monthfollow-up of ischemic cardiomyopathy with reduced ejection fraction of 25-30%.  He is presently doing well, with only 2 recurrences of angina pectoris.  There are currently no clinical signs of heart failure.  He is tolerating maximum dose of beta-blocker therapy however his heart rate is elevated >70 bpm as evidenced by EKG today with a rate of 93 bpm.  As his blood pressure is soft will start him on Corlanor 5 mg twice daily.  We will continue other guideline directed medical therapy including aspirin, atorvastatin, metoprolol succinate, Entresto, and Brilinta.  In view of renal insufficiency patient is not currently on spironolactone.  I reviewed labs, his lipids are well controlled.  Reviewed and discussed echocardiogram results with patient. Overall he is stable from a cardiovascular standpoint.  Again discussed with patient regarding smoking cessation, and offered him assistance of nicotine patches.  He states he is motivated to quit smoking without the patches.  Will follow up in 2-3 weeks to reevaluate heart rate control with the addition of Corlanor.   Patient was seen in collaboration with Dr. GEinar Gip He also reviewed patient's chart and Dr. GEinar Gipis in agreement of the plan.    CAlethia Berthold PA-C 08/01/2020, 4:07 PM Office: [phone]

## 2020-08-11 ENCOUNTER — Other Ambulatory Visit: Payer: Self-pay

## 2020-08-11 MED ORDER — IVABRADINE HCL 5 MG PO TABS
5.0000 mg | ORAL_TABLET | Freq: Two times a day (BID) | ORAL | 3 refills | Status: DC
Start: 2020-08-11 — End: 2021-04-08

## 2020-08-14 DIAGNOSIS — I255 Ischemic cardiomyopathy: Secondary | ICD-10-CM | POA: Diagnosis not present

## 2020-08-14 DIAGNOSIS — Z4502 Encounter for adjustment and management of automatic implantable cardiac defibrillator: Secondary | ICD-10-CM | POA: Diagnosis not present

## 2020-08-14 DIAGNOSIS — I428 Other cardiomyopathies: Secondary | ICD-10-CM | POA: Diagnosis not present

## 2020-08-14 DIAGNOSIS — I5022 Chronic systolic (congestive) heart failure: Secondary | ICD-10-CM | POA: Diagnosis not present

## 2020-08-14 DIAGNOSIS — Z95810 Presence of automatic (implantable) cardiac defibrillator: Secondary | ICD-10-CM | POA: Diagnosis not present

## 2020-08-27 DIAGNOSIS — N183 Chronic kidney disease, stage 3 unspecified: Secondary | ICD-10-CM | POA: Diagnosis not present

## 2020-08-27 DIAGNOSIS — I110 Hypertensive heart disease with heart failure: Secondary | ICD-10-CM | POA: Diagnosis not present

## 2020-08-27 DIAGNOSIS — C3490 Malignant neoplasm of unspecified part of unspecified bronchus or lung: Secondary | ICD-10-CM | POA: Diagnosis not present

## 2020-08-27 DIAGNOSIS — K5090 Crohn's disease, unspecified, without complications: Secondary | ICD-10-CM | POA: Diagnosis not present

## 2020-08-27 DIAGNOSIS — J449 Chronic obstructive pulmonary disease, unspecified: Secondary | ICD-10-CM | POA: Diagnosis not present
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
## 2020-08-29 ENCOUNTER — Encounter: Payer: Self-pay | Admitting: Student

## 2020-08-29 ENCOUNTER — Other Ambulatory Visit: Payer: Self-pay

## 2020-08-29 ENCOUNTER — Ambulatory Visit: Payer: Medicare Other | Admitting: Student

## 2020-08-29 VITALS — BP 107/65 | HR 69 | Resp 16 | Ht 71.0 in | Wt 153.0 lb

## 2020-08-29 DIAGNOSIS — E7800 Pure hypercholesterolemia, unspecified: Secondary | ICD-10-CM

## 2020-08-29 DIAGNOSIS — Z95810 Presence of automatic (implantable) cardiac defibrillator: Secondary | ICD-10-CM

## 2020-08-29 DIAGNOSIS — N1831 Chronic kidney disease, stage 3a: Secondary | ICD-10-CM

## 2020-08-29 DIAGNOSIS — I255 Ischemic cardiomyopathy: Secondary | ICD-10-CM

## 2020-08-29 DIAGNOSIS — I2510 Atherosclerotic heart disease of native coronary artery without angina pectoris: Secondary | ICD-10-CM

## 2020-08-29 DIAGNOSIS — F17209 Nicotine dependence, unspecified, with unspecified nicotine-induced disorders: Secondary | ICD-10-CM

## 2020-08-29 DIAGNOSIS — I25118 Atherosclerotic heart disease of native coronary artery with other forms of angina pectoris: Secondary | ICD-10-CM | POA: Diagnosis not present

## 2020-08-29 DIAGNOSIS — I5042 Chronic combined systolic (congestive) and diastolic (congestive) heart failure: Secondary | ICD-10-CM

## 2020-08-29 DIAGNOSIS — F17210 Nicotine dependence, cigarettes, uncomplicated: Secondary | ICD-10-CM | POA: Diagnosis not present

## 2020-08-29 MED ORDER — ATORVASTATIN CALCIUM 80 MG PO TABS: 80.0000 mg | ORAL_TABLET | Freq: Every day | ORAL | 2 refills | Status: DC

## 2020-08-29 MED ORDER — TICAGRELOR 90 MG PO TABS: 90.0000 mg | ORAL_TABLET | Freq: Two times a day (BID) | ORAL | 1 refills | Status: DC

## 2020-08-29 MED ORDER — FUROSEMIDE 20 MG PO TABS: 20.0000 mg | ORAL_TABLET | Freq: Every day | ORAL | 3 refills | Status: DC | PRN

## 2020-08-29 MED ORDER — ENTRESTO 49-51 MG PO TABS: 1.0000 | ORAL_TABLET | Freq: Two times a day (BID) | ORAL | 3 refills | Status: DC

## 2020-08-29 MED ORDER — NICOTINE 14 MG/24HR TD PT24: 14.0000 mg | MEDICATED_PATCH | Freq: Every day | TRANSDERMAL | 0 refills | Status: DC

## 2020-08-29 MED ORDER — NICOTINE 7 MG/24HR TD PT24: 7.0000 mg | MEDICATED_PATCH | Freq: Every day | TRANSDERMAL | 0 refills | Status: DC

## 2020-08-29 NOTE — Patient Instructions (Signed)
Blood work at [facility].

## 2020-09-02 ENCOUNTER — Other Ambulatory Visit: Payer: Self-pay | Admitting: Cardiology

## 2020-09-02 DIAGNOSIS — I2510 Atherosclerotic heart disease of native coronary artery without angina pectoris: Secondary | ICD-10-CM

## 2020-09-19 ENCOUNTER — Telehealth: Payer: Self-pay | Admitting: Student

## 2020-09-19 DIAGNOSIS — R031 Nonspecific low blood-pressure reading: Secondary | ICD-10-CM | POA: Diagnosis not present

## 2020-09-19 DIAGNOSIS — I2510 Atherosclerotic heart disease of native coronary artery without angina pectoris: Secondary | ICD-10-CM

## 2020-09-19 DIAGNOSIS — I255 Ischemic cardiomyopathy: Secondary | ICD-10-CM | POA: Diagnosis not present

## 2020-09-19 MED ORDER — METOPROLOL SUCCINATE ER 100 MG PO TB24: 100.0000 mg | ORAL_TABLET | Freq: Every day | ORAL | 1 refills | Status: DC

## 2020-09-19 NOTE — Telephone Encounter (Signed)
ON-CALL CARDIOLOGY 09/19/20   Patient's name: Jimmy RZEPKA Sr..   MRN: [id number].    DOB: 09/07/1966 Primary care provider: Maryella Shivers, MD. Primary cardiologist: Lawerance Cruel, Dr. Einar Gip  Interaction regarding this patient's care today: Patient called concerned that his blood pressure was 85/63 mmHg this morning. Patient denies symptoms including dizziness, light-headedness, fatigue, syncope, near-syncope. His baseline blood pressure is soft. Advised him to reduce metoprolol to once daily and to monitor for symptoms. Reassured him in view of the fact that he is asymptomatic. Patient will notify our office if he becomes symptomatic. Also advised him to reduce metoprolol from 100 mg to 50 mg if he becomes symptomatic. Patient verbalized understanding and agreement.   Impression:   ICD-10-CM   1. Low blood pressure reading  R03.1   2. Coronary artery disease involving native coronary artery of native heart without angina pectoris  I25.10 metoprolol succinate (TOPROL-XL) 100 MG 24 hr tablet   Proximal to mid LAD thrombectomy and 3.5 x 20, 3.0 x 12 mm on 05/05/2019.  3. Ischemic cardiomyopathy  I25.5 metoprolol succinate (TOPROL-XL) 100 MG 24 hr tablet    Meds ordered this encounter  Medications  . metoprolol succinate (TOPROL-XL) 100 MG 24 hr tablet    Sig: Take 1 tablet (100 mg total) by mouth daily. Take with or immediately following a meal.    Dispense:  180 tablet    Refill:  1    No orders of the defined types were placed in this encounter.   Telephone encounter total time: [address]     Alethia Berthold, PA-C 09/19/2020, 11:50 AM Office: [phone]

## 2020-09-27 DIAGNOSIS — N183 Chronic kidney disease, stage 3 unspecified: Secondary | ICD-10-CM | POA: Diagnosis not present

## 2020-09-27 DIAGNOSIS — I110 Hypertensive heart disease with heart failure: Secondary | ICD-10-CM | POA: Diagnosis not present

## 2020-09-27 DIAGNOSIS — J449 Chronic obstructive pulmonary disease, unspecified: Secondary | ICD-10-CM | POA: Diagnosis not present

## 2020-09-27 DIAGNOSIS — C3490 Malignant neoplasm of unspecified part of unspecified bronchus or lung: Secondary | ICD-10-CM | POA: Diagnosis not present

## 2020-09-27 DIAGNOSIS — K5090 Crohn's disease, unspecified, without complications: Secondary | ICD-10-CM | POA: Diagnosis not present

## 2020-10-15 DIAGNOSIS — I5042 Chronic combined systolic (congestive) and diastolic (congestive) heart failure: Secondary | ICD-10-CM | POA: Diagnosis not present

## 2020-10-15 DIAGNOSIS — Z13220 Encounter for screening for lipoid disorders: Secondary | ICD-10-CM | POA: Diagnosis not present

## 2020-10-15 DIAGNOSIS — N183 Chronic kidney disease, stage 3 unspecified: Secondary | ICD-10-CM | POA: Diagnosis not present

## 2020-10-15 DIAGNOSIS — N1831 Chronic kidney disease, stage 3a: Secondary | ICD-10-CM | POA: Diagnosis not present

## 2020-10-16 ENCOUNTER — Other Ambulatory Visit: Payer: Self-pay | Admitting: Cardiology

## 2020-10-16 DIAGNOSIS — I255 Ischemic cardiomyopathy: Secondary | ICD-10-CM

## 2020-10-16 DIAGNOSIS — I2510 Atherosclerotic heart disease of native coronary artery without angina pectoris: Secondary | ICD-10-CM

## 2020-10-16 LAB — BASIC METABOLIC PANEL
BUN/Creatinine Ratio: 9 (ref 9–20)
BUN: 14 mg/dL (ref 6–24)
CO2: 23 mmol/L (ref 20–29)
Calcium: 9 mg/dL (ref 8.7–10.2)
Chloride: 104 mmol/L (ref 96–106)
Creatinine, Ser: 1.52 mg/dL — ABNORMAL HIGH (ref 0.76–1.27)
GFR calc Af Amer: 59 mL/min/{1.73_m2} — ABNORMAL LOW (ref >59)
GFR calc non Af Amer: 51 mL/min/{1.73_m2} — ABNORMAL LOW (ref >59)
Glucose: 149 mg/dL — ABNORMAL HIGH (ref 65–99)
Potassium: 3.6 mmol/L (ref 3.5–5.2)
Sodium: 141 mmol/L (ref 134–144)

## 2020-10-16 LAB — BRAIN NATRIURETIC PEPTIDE: BNP: 294.8 pg/mL — ABNORMAL HIGH (ref 0.0–100.0)

## 2020-10-16 NOTE — Progress Notes (Signed)
Please inform patient kidney function is stable and BNP (measure of heart failure) is mildly elevated, but trending down. Will continue to monitor as needed.

## 2020-10-17 NOTE — Progress Notes (Signed)
Pt called back, I went over BNP and BMP results. Pt voiced understanding.

## 2020-10-17 NOTE — Progress Notes (Signed)
Attempted to call pt, no answer. Left vm requesting call back.

## 2020-10-28 DIAGNOSIS — J449 Chronic obstructive pulmonary disease, unspecified: Secondary | ICD-10-CM | POA: Diagnosis not present

## 2020-10-28 DIAGNOSIS — I110 Hypertensive heart disease with heart failure: Secondary | ICD-10-CM | POA: Diagnosis not present

## 2020-10-28 DIAGNOSIS — N183 Chronic kidney disease, stage 3 unspecified: Secondary | ICD-10-CM | POA: Diagnosis not present

## 2020-10-28 DIAGNOSIS — C3490 Malignant neoplasm of unspecified part of unspecified bronchus or lung: Secondary | ICD-10-CM | POA: Diagnosis not present

## 2020-10-28 DIAGNOSIS — K5090 Crohn's disease, unspecified, without complications: Secondary | ICD-10-CM | POA: Diagnosis not present

## 2020-11-05 DIAGNOSIS — J449 Chronic obstructive pulmonary disease, unspecified: Secondary | ICD-10-CM | POA: Diagnosis not present

## 2020-11-05 DIAGNOSIS — K5090 Crohn's disease, unspecified, without complications: Secondary | ICD-10-CM | POA: Diagnosis not present

## 2020-11-05 DIAGNOSIS — I509 Heart failure, unspecified: Secondary | ICD-10-CM | POA: Diagnosis not present

## 2020-11-05 DIAGNOSIS — N1831 Chronic kidney disease, stage 3a: Secondary | ICD-10-CM | POA: Diagnosis not present

## 2020-11-13 DIAGNOSIS — I255 Ischemic cardiomyopathy: Secondary | ICD-10-CM | POA: Diagnosis not present

## 2020-11-13 DIAGNOSIS — Z4502 Encounter for adjustment and management of automatic implantable cardiac defibrillator: Secondary | ICD-10-CM | POA: Diagnosis not present

## 2020-11-13 DIAGNOSIS — Z95810 Presence of automatic (implantable) cardiac defibrillator: Secondary | ICD-10-CM | POA: Diagnosis not present

## 2020-11-13 DIAGNOSIS — I428 Other cardiomyopathies: Secondary | ICD-10-CM | POA: Diagnosis not present

## 2020-11-13 DIAGNOSIS — I5022 Chronic systolic (congestive) heart failure: Secondary | ICD-10-CM | POA: Diagnosis not present

## 2020-11-19 DIAGNOSIS — Z8679 Personal history of other diseases of the circulatory system: Secondary | ICD-10-CM | POA: Diagnosis not present

## 2020-11-19 DIAGNOSIS — Z8673 Personal history of transient ischemic attack (TIA), and cerebral infarction without residual deficits: Secondary | ICD-10-CM | POA: Diagnosis not present

## 2020-11-19 DIAGNOSIS — R55 Syncope and collapse: Secondary | ICD-10-CM | POA: Diagnosis not present

## 2020-11-19 DIAGNOSIS — J439 Emphysema, unspecified: Secondary | ICD-10-CM | POA: Diagnosis not present

## 2020-11-19 DIAGNOSIS — R404 Transient alteration of awareness: Secondary | ICD-10-CM | POA: Diagnosis not present

## 2020-11-19 DIAGNOSIS — G9340 Encephalopathy, unspecified: Secondary | ICD-10-CM | POA: Diagnosis not present

## 2020-11-19 DIAGNOSIS — Z743 Need for continuous supervision: Secondary | ICD-10-CM | POA: Diagnosis not present

## 2020-11-19 DIAGNOSIS — Z85118 Personal history of other malignant neoplasm of bronchus and lung: Secondary | ICD-10-CM | POA: Diagnosis not present

## 2020-11-19 DIAGNOSIS — Z79899 Other long term (current) drug therapy: Secondary | ICD-10-CM | POA: Diagnosis not present

## 2020-11-19 DIAGNOSIS — I252 Old myocardial infarction: Secondary | ICD-10-CM | POA: Diagnosis not present

## 2020-11-19 DIAGNOSIS — I10 Essential (primary) hypertension: Secondary | ICD-10-CM | POA: Diagnosis not present

## 2020-11-19 DIAGNOSIS — R0902 Hypoxemia: Secondary | ICD-10-CM | POA: Diagnosis not present

## 2020-11-25 DIAGNOSIS — J449 Chronic obstructive pulmonary disease, unspecified: Secondary | ICD-10-CM | POA: Diagnosis not present

## 2020-11-25 DIAGNOSIS — N1831 Chronic kidney disease, stage 3a: Secondary | ICD-10-CM | POA: Diagnosis not present

## 2020-11-25 DIAGNOSIS — I509 Heart failure, unspecified: Secondary | ICD-10-CM | POA: Diagnosis not present

## 2020-11-25 DIAGNOSIS — C3490 Malignant neoplasm of unspecified part of unspecified bronchus or lung: Secondary | ICD-10-CM | POA: Diagnosis not present

## 2020-11-25 DIAGNOSIS — K5090 Crohn's disease, unspecified, without complications: Secondary | ICD-10-CM | POA: Diagnosis not present

## 2020-11-28 ENCOUNTER — Ambulatory Visit: Payer: Medicare Other | Admitting: Student
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
## 2020-12-12 ENCOUNTER — Ambulatory Visit: Payer: Medicare Other | Admitting: Student

## 2020-12-12 DIAGNOSIS — I255 Ischemic cardiomyopathy: Secondary | ICD-10-CM

## 2020-12-12 DIAGNOSIS — I2510 Atherosclerotic heart disease of native coronary artery without angina pectoris: Secondary | ICD-10-CM

## 2020-12-12 DIAGNOSIS — I5042 Chronic combined systolic (congestive) and diastolic (congestive) heart failure: Secondary | ICD-10-CM

## 2020-12-12 DIAGNOSIS — F17209 Nicotine dependence, unspecified, with unspecified nicotine-induced disorders: Secondary | ICD-10-CM

## 2020-12-17 ENCOUNTER — Other Ambulatory Visit: Payer: Self-pay | Admitting: *Deleted

## 2020-12-17 NOTE — Patient Outreach (Signed)
[place] [facility]) Care Management  12/17/2020  Jimmy GORTER Sr. [birth year] [phone]   Telephone Screen/Assessment-Unsuccessful  Outreach #1 RN attempted outreach call today however only able to leave a HIPAA approved voice message requesting a call back.  Will rescheduled another outreach call over the next week.  Raina Mina, RN Care Management Coordinator [place] Office [phone]
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
## 2020-12-19 ENCOUNTER — Ambulatory Visit: Payer: Medicare Other | Admitting: Student

## 2020-12-19 DIAGNOSIS — Z95810 Presence of automatic (implantable) cardiac defibrillator: Secondary | ICD-10-CM

## 2020-12-19 DIAGNOSIS — I5042 Chronic combined systolic (congestive) and diastolic (congestive) heart failure: Secondary | ICD-10-CM

## 2020-12-19 DIAGNOSIS — I255 Ischemic cardiomyopathy: Secondary | ICD-10-CM

## 2020-12-19 DIAGNOSIS — I2510 Atherosclerotic heart disease of native coronary artery without angina pectoris: Secondary | ICD-10-CM

## 2020-12-23 ENCOUNTER — Other Ambulatory Visit: Payer: Self-pay | Admitting: *Deleted

## 2020-12-23 NOTE — Patient Outreach (Signed)
[facility]) Care Management  12/23/2020  Jimmy Nelson Sr. [birth year] [id number]   Telephone Screen-Unsuccessful  RN attempted outreach call however unsuccessful. RN able to leave a HIPAA approved voice message requesting a call back. Will further engage at that time.  Will rescheduled another outreach call over the next week.  Raina Mina, RN Care Management Coordinator [place] Office [phone]

## 2020-12-23 NOTE — Progress Notes (Signed)
Primary Physician/Referring:  Maryella Shivers, MD  Patient ID: Jimmy Ochs., male    DOB: [birth year], 55 y.o.   MRN: [id number]  Chief Complaint  Patient presents with  . Coronary Artery Disease  . Follow-up   HPI:    Jimmy Nelson.  is a 55 y.o. Caucasian male with lung cancer in remission, ongoing tobacco use disorder, COPD on continuous home O2 at 2.5L/min, hypertension, CAD SP LAD stenting when he presented with acute pulmonary edema on 05/05/2019, severe ischemic cardiomyopathy with ejection fraction 25 to 30% SP ICD implantation on 09/10/2019.  According to patient right kidney failure secondary to complications of ureter stenting in 2000.    Patient presents for 3 month follow up of ischemic cardiomyopathy and tobacco cessation.  Unfortunately patient had previously further reduce smoking, however he has been under stress as of late reportedly in his also increased his daily smoking habits.  Also in the office today patient presents with several oxygen at 2 L/min via nasal cannula.  He states that during warmer months of the year he requires supplemental oxygen both at home, which is baseline as well as when he is out of the house.  Patient reports this is baseline.  He also expresses frustration regarding constant fatigue since his cardiac catheterization in 2020.  He has noticed worsening fatigue over the last 1 to 2 weeks as well.  Patient denies PND, orthopnea, leg swelling.  He denies chest pain, palpitations, dizziness, syncope, near syncope.  He continues to have chronic stable dyspnea on exertion due to underlying COPD.  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   Patient continues to eat high sodium intake diet, and consume high amounts of sugar, particularly in his [facility] which she drinks throughout the day.   Past Medical History:  Diagnosis Date  . Amnesia   .  Anemia   . Chronic systolic heart failure ([place]) 04/14/2020  . COPD (chronic obstructive pulmonary disease) ([place])   . Crohn's disease ([place]) Deteriorating disk  . Depression   . Encounter for assessment of implantable cardioverter-defibrillator (ICD) 04/14/2020  . History of MI (myocardial infarction) 05/05/2019  . History of stroke    2  . Hyperlipidemia   . Hypertension   . ICD - Single chamber [employer] GALLANT VR ICD 09/10/2019 09/10/2019  . Lung cancer ([place])   . Mental disorder   . Sleep apnea   . Stroke [facility])    Past Surgical History:  Procedure Laterality Date  . APPENDECTOMY    . CORONARY/GRAFT ACUTE MI REVASCULARIZATION N/A 05/05/2019   Procedure: Coronary/Graft Acute MI Revascularization;  Surgeon: Adrian Prows, MD;  Location: [place] CV [facility];  Service: Cardiovascular;  Laterality: N/A;  . EYE SURGERY    . ICD IMPLANT N/A 09/10/2019   Procedure: ICD IMPLANT;  Surgeon: Evans Lance, MD;  Location: [place] [facility];  Service: Cardiovascular;  Laterality: N/A;  . intestestine for chrohns disease    . LEFT HEART CATH AND CORONARY ANGIOGRAPHY N/A 05/05/2019   Procedure: LEFT HEART CATH AND CORONARY ANGIOGRAPHY;  Surgeon: Adrian Prows, MD;  Location: [place] [facility];  Service: Cardiovascular;  Laterality: N/A;  . PORT-A-CATH REMOVAL    History reviewed. No pertinent family history. No family history of premature CAD or sudden cardiac death.  Social History   Tobacco Use  . Smoking status: Current Every Day Smoker  Types: Cigarettes  . Smokeless tobacco: Current User    Types: Snuff  . Tobacco comment: 3 cigarettes daily   Substance Use Topics  . Alcohol use: Not Currently   Marital Status: Legally Separated  ROS  Review of Systems  Constitutional: Positive for malaise/fatigue. Negative for weight gain.  Cardiovascular: Positive for dyspnea on exertion (Chronic, stable). Negative for chest pain, claudication, leg swelling, near-syncope, orthopnea,  palpitations, paroxysmal nocturnal dyspnea and syncope.  Respiratory: Negative for shortness of breath.   Hematologic/Lymphatic: Does not bruise/bleed easily.  Gastrointestinal: Negative for melena.  Neurological: Negative for dizziness and weakness.  Psychiatric/Behavioral: Negative for depression. The patient is not nervous/anxious.    Objective  Blood pressure 105/70, pulse 74, temperature 98.6 F (37 C), height 5' 11"  (1.803 m), weight 155 lb (70.3 kg), SpO2 99 %.  Vitals with BMI 12/24/2020 08/29/2020 08/01/2020  Height 5' 11"  5' 11"  5' 11"   Weight 155 lbs 153 lbs 151 lbs  BMI 21.63 11.91 47.82  Systolic 956 213 086  Diastolic 70 65 69  Pulse 74 69 76  Some encounter information is confidential and restricted. Go to Review Flowsheets activity to see all data.     Physical Exam Vitals reviewed.  Constitutional:      Appearance: He is cachectic.     Comments: Appears older than stated age   HENT:     Head: Normocephalic and atraumatic.  Neck:     Thyroid: No thyromegaly.     Vascular: No JVD.  Cardiovascular:     Rate and Rhythm: Normal rate and regular rhythm.     Pulses: Intact distal pulses.     Heart sounds: S1 normal and S2 normal. Heart sounds are distant. No murmur heard. No gallop.   Pulmonary:     Effort: Pulmonary effort is normal. Tachypnea present. No respiratory distress.     Breath sounds: Wheezing present. No decreased breath sounds, rhonchi or rales.     Comments: 2 L/min O2 via nasal cannula Musculoskeletal:     Right lower leg: No edema.     Left lower leg: No edema.  Neurological:     General: No focal deficit present.     Mental Status: He is alert and oriented to person, place, and time.    Laboratory examination:   Recent Labs    01/09/20 1118 10/15/20 1413  NA 143 141  K 4.6 3.6  CL 104 104  CO2 19* 23  GLUCOSE 87 149*  BUN 13 14  CREATININE 1.56* 1.52*  CALCIUM 9.8 9.0  GFRNONAA 50* 51*  GFRAA 58* 59*   CrCl cannot be calculated  (Patient's most recent lab result is older than the maximum 21 days allowed.).  CMP Latest Ref Rng & Units 10/15/2020 01/09/2020 08/22/2019  Glucose 65 - 99 mg/dL 149(H) 87 64(L)  BUN 6 - 24 mg/dL 14 13 13   Creatinine 0.76 - 1.27 mg/dL 1.52(H) 1.56(H) 1.45(H)  Sodium 134 - 144 mmol/L 141 143 144  Potassium 3.5 - 5.2 mmol/L 3.6 4.6 3.7  Chloride 96 - 106 mmol/L 104 104 109(H)  CO2 20 - 29 mmol/L 23 19(L) 21  Calcium 8.7 - 10.2 mg/dL 9.0 9.8 9.3  Total Protein 6.0 - 8.5 g/dL - 7.3 -  Total Bilirubin 0.0 - 1.2 mg/dL - 0.4 -  Alkaline Phos 39 - 117 IU/L - 89 -  AST 0 - 40 IU/L - 33 -  ALT 0 - 44 IU/L - 14 -   CBC Latest Ref Rng &  Units 08/22/2019 05/25/2019 05/06/2019  WBC 3.4 - 10.8 x10E3/uL 8.1 12.3(H) 20.6(H)  Hemoglobin 13.0 - 17.7 g/dL 15.1 13.5 16.4  Hematocrit 37.5 - 51.0 % 46.2 42.8 51.2  Platelets 150 - 450 x10E3/uL 310 276 346   Lipid Panel     Component Value Date/Time   CHOL 78 (L) 01/09/2020 1118   TRIG 112 01/09/2020 1118   HDL 28 (L) 01/09/2020 1118   CHOLHDL 2.8 01/09/2020 1118   CHOLHDL 3.5 05/05/2019 0312   VLDL 28 05/05/2019 0312   LDLCALC 29 01/09/2020 1118   HEMOGLOBIN A1C Lab Results  Component Value Date   HGBA1C 5.4 05/05/2019   MPG 108.28 05/05/2019   TSH No results for input(s): TSH in the last 8760 hours.   External labs:  Cholesterol, total 78.000 01/09/2020 HDL 28.000 01/09/2020 LDL 56.000 05/05/2019 Triglycerides 112.000 01/09/2020  A1C 5.400 05/05/2019  Hemoglobin 15.300 G/ 08/22/2019  Creatinine, Serum 1.560 01/09/2020 Potassium 4.600 01/09/2020 ALT (SGPT) 14.000 01/09/2020   Medications and allergies   Allergies  Allergen Reactions  . Bee Venom Anaphylaxis  . Iodinated Diagnostic Agents Anaphylaxis    It is noted that pt is allergic to Iodinated contrast media-iv dye,oral contrast  . Chantix [Varenicline Tartrate]     Seizures      Current Outpatient Medications  Medication Instructions  . acetaminophen (TYLENOL) 650 mg, Oral, Every  6 hours PRN  . albuterol (PROVENTIL HFA;VENTOLIN HFA) 108 (90 BASE) MCG/ACT inhaler 1 puff, Inhalation, Every 4 hours PRN  . aspirin 81 mg, Oral, Daily  . atorvastatin (LIPITOR) 80 mg, Oral, Daily-1800  . BREZTRI AEROSPHERE 160-9-4.8 MCG/ACT AERO No dose, route, or frequency recorded.  . Cyanocobalamin 1,500 mcg, Oral, Daily  . diclofenac Sodium (VOLTAREN) 1 % GEL 1 application, Topical, 2 times daily  . divalproex (DEPAKOTE) 250-500 mg, Oral, See admin instructions, Tale 250 mg in the morning and 500 mg in the evening  . docusate sodium (COLACE) 100 mg, Oral, 2 times daily  . EPINEPHrine 0.3 mg/0.3 mL IJ SOAJ injection Intramuscular, As needed  . esomeprazole (NEXIUM) 40 mg, Oral, Daily  . ferrous sulfate 325 mg, Oral, Daily with breakfast  . furosemide (LASIX) 20 mg, Oral, Daily PRN, Take 20 mg once daily as needed for shortness of breath, leg swelling, or >3lb weight gain.  Marland Kitchen gabapentin (NEURONTIN) 600 mg, Oral, 3 times daily  . hydrOXYzine (ATARAX/VISTARIL) 25 mg, Oral, Every 8 hours PRN  . ibuprofen (ADVIL) 800 mg, Oral, Every 8 hours PRN  . ipratropium-albuterol (DUONEB) 0.5-2.5 (3) MG/3ML SOLN 3 mLs, Inhalation, Every 6 hours PRN  . ivabradine (CORLANOR) 5 mg, Oral, 2 times daily with meals  . metoprolol succinate (TOPROL-XL) 100 MG 24 hr tablet TAKE 1 TABLET BY MOUTH TWICE (2) DAILY WITH OR IMMEDIATELY FOLLOWING A MEAL  . montelukast (SINGULAIR) 10 mg, Oral, Daily  . Multiple Vitamins-Minerals (MULTIVITAMIN WITH MINERALS) tablet 1 tablet, Oral, Daily  . nicotine (NICODERM CQ - DOSED IN MG/24 HOURS) 14 mg, Transdermal, Daily  . nicotine (NICODERM CQ - DOSED IN MG/24 HR) 7 mg, Transdermal, Daily  . nitroGLYCERIN (NITROSTAT) 0.4 MG SL tablet DISSOLVE 1 TABLET UNDER THE TONGUE EVERY 5 MINUTES AS NEEDED FOR CHEST PAIN. (MAXIMUM OF 3 DOSES)  . OXYGEN 2.5 L, Inhalation  . oxymetazoline (AFRIN) 0.05 % nasal spray 1 spray, Each Nare, 2 times daily PRN  . pyridOXINE (VITAMIN B-6) 100 mg,  Oral, Daily  . sacubitril-valsartan (ENTRESTO) 49-51 MG 1 tablet, Oral, 2 times daily  . sertraline (ZOLOFT) 100  mg, Oral, Daily  . SYMBICORT 160-4.5 MCG/ACT inhaler 2 puffs, 2 times daily  . tadalafil (CIALIS) 20 mg, Daily PRN  . traZODone (DESYREL) 200 mg, Oral, Daily at bedtime    Radiology:  No results found.  Cardiac Studies:   Coronary Angiography 05/05/2019:Prox LAD to Mid LAD lesion is 100% stenosed S/P aspiration thrombectomy followed by overlapping 3.5 x 20 and a 3.0 x 12 mm Synergy DES distally, stenosis reduced from 10 percent to 0%. D1 has ostial 20 to 30% stenosis. Circumflex: Mild disease in the proximal and, OM 2 is very tiny and severely diseased. RCA is dominant, proximal 50% stenosis. LVEDP markedly elevated at 27 mmHg. EF 10 to 15% with anterolateral akinesis. 110 mL contrast utilized, 6.9 minutes of fluoroscopy time. Recommendation: Patient will be kept in the intensive care unit, he may need a hospital admission for 48 hours or more in view of severe LV dysfunction unless he does well clinically. I discussed the findings with his wife on the telephone.  Echocardiogram 07/20/2019: Left ventricle cavity is normal in size. Mild concentric hypertrophy of the left ventricle. Moderate global hypokinesis with distal antero-apical, apical dyskinesis c/w prior myocardial infarction. Severely depressed LV systolic function with visual EF 25-30%. Indeterminate diastolic filling pattern.  No significant valvular abnormality. Normal right atrial pressure. No significant change compared to previous study on 05/05/2019.   ICD implantation 09/10/2019: CONCLUSIONS:   1. Ischemic cardiomyopathy with chronic New York Heart Association class II heart failure.   2. Successful ICD implantation.   3. No early apparent complications.   PCV ECHOCARDIOGRAM COMPLETE 04/10/2020 Poor visualization of endocardial borders. Left ventricle cavity is normal in size. Moderate concentric  hypertrophy of the left ventricle. Severe global hypokinesis. LVEF 25-30%. Indeterminate diastolic filling pattern. IVC is normal with blunted respiratory response. Estimated RA pressure 8 mmHg. No significant change compared to previous study on 05/05/2019.  Scheduled Remote ICD check 11/06/2020: There were 0 VF episodes, 0 VT episodes and 0 episodes of nonsustained ventricular tachycardia. Health trends are stable. Corvue impedance monitoring does not suggest recent fluid accumulation.   Battery longevity is 8.2 years. RV pacing is <1.0 %.  EKG   EKG 08/29/2020: Sinus rhythm at a rate of 70 bpm, left atrial enlargement, normal axis.  PRWP, cannot exclude anteroseptal infarct old.  Low voltage complexes.  Nonspecific T wave abnormality. No significant change compared to 11/5/20211.   EKG 08/01/2020: Sinus rhythm at a rate of 93 bpm with first degree AV block, left atrial enlargement.  Normal axis.  Poor R wave progression, cannot exclude anterior septal infarct old.  Low voltage complexes.  Compared to EKG 01/18/2020, no significant change.  Assessment     ICD-10-CM   1. Ischemic cardiomyopathy  I25.5 PCV ECHOCARDIOGRAM COMPLETE    Brain natriuretic peptide  2. Coronary artery disease involving native coronary artery of native heart without angina pectoris  I25.10   3. Chronic combined systolic and diastolic heart failure (HCC)  I50.42 PCV ECHOCARDIOGRAM COMPLETE    Brain natriuretic peptide  4. Nicotine dependence, cigarettes, uncomplicated  C14.481   5. Other fatigue  R53.83 Vitamin D 1,25 dihydroxy    No orders of the defined types were placed in this encounter.   Medications Discontinued During This Encounter  Medication Reason  . ticagrelor (BRILINTA) 90 MG TABS tablet Completed Course    Recommendations:   Jimmy BOETTNER Sr.  is a 55 y.o. Caucasian male with lung cancer in remission, ongoing tobacco use disorder, COPD, hypertension, CAD  SP LAD stenting when he presented with  acute pulmonary edema on 05/05/2019, severe ischemic cardiomyopathy with ejection fraction 25 to 30% SP ICD implantation on 09/10/2019.  Patient presents for 3 month follow up of ischemic cardiomyopathy and tobacco cessation.  Patient continues to express frustration regarding fatigue which he states has been constant since cardiac catheterization.  Counseled patient regarding deconditioning following cardiac catheterization as well as in view of significantly reduced LVEF.  However this patient reports worsening of this fatigue over the last 2 weeks will obtain repeat BNP.  Patient's fianc who is present at bedside is also requesting that we obtain vitamin D level, therefore will order this as well.  Patient's last echocardiogram was in July 2021, therefore will obtain repeat echocardiogram prior to follow-up visit.   Patient is tolerating guideline directed medical therapy relatively well, therefore we will continue aspirin, atorvastatin, metoprolol, Entresto, and Corlanor.  In view of renal insufficiency patient is currently not on spironolactone.  His blood pressure remains soft, therefore he is on maximum tolerated Entresto and beta-blocker at this time.  Patient has not been on dual antiplatelet therapy for approximately 18 months following stenting to LAD.  We will therefore discontinue Brilinta at this time.  Patient will continue taking aspirin daily.  Again counseled patient regarding diet compliance as well as smoking cessation.  Follow-up in 6 months, sooner if needed, for ischemic cardiomyopathy.   Alethia Berthold, PA-C 12/24/2020, 2:50 PM Office: [phone]

## 2020-12-24 ENCOUNTER — Ambulatory Visit: Payer: Medicare Other | Admitting: Student

## 2020-12-24 ENCOUNTER — Encounter: Payer: Self-pay | Admitting: Student

## 2020-12-24 ENCOUNTER — Other Ambulatory Visit: Payer: Self-pay

## 2020-12-24 VITALS — BP 105/70 | HR 74 | Temp 98.6°F | Ht 71.0 in | Wt 155.0 lb

## 2020-12-24 DIAGNOSIS — I5042 Chronic combined systolic (congestive) and diastolic (congestive) heart failure: Secondary | ICD-10-CM

## 2020-12-24 DIAGNOSIS — R5383 Other fatigue: Secondary | ICD-10-CM | POA: Diagnosis not present

## 2020-12-24 DIAGNOSIS — I2510 Atherosclerotic heart disease of native coronary artery without angina pectoris: Secondary | ICD-10-CM

## 2020-12-24 DIAGNOSIS — K5090 Crohn's disease, unspecified, without complications: Secondary | ICD-10-CM | POA: Diagnosis not present

## 2020-12-24 DIAGNOSIS — F17210 Nicotine dependence, cigarettes, uncomplicated: Secondary | ICD-10-CM

## 2020-12-24 DIAGNOSIS — I255 Ischemic cardiomyopathy: Secondary | ICD-10-CM | POA: Diagnosis not present

## 2020-12-26 DIAGNOSIS — C3490 Malignant neoplasm of unspecified part of unspecified bronchus or lung: Secondary | ICD-10-CM | POA: Diagnosis not present

## 2020-12-26 DIAGNOSIS — Z9181 History of falling: Secondary | ICD-10-CM | POA: Diagnosis not present

## 2020-12-26 DIAGNOSIS — J449 Chronic obstructive pulmonary disease, unspecified: Secondary | ICD-10-CM | POA: Diagnosis not present

## 2020-12-26 DIAGNOSIS — I509 Heart failure, unspecified: Secondary | ICD-10-CM | POA: Diagnosis not present

## 2020-12-26 DIAGNOSIS — K5090 Crohn's disease, unspecified, without complications: Secondary | ICD-10-CM | POA: Diagnosis not present

## 2020-12-29 ENCOUNTER — Other Ambulatory Visit: Payer: Self-pay | Admitting: *Deleted

## 2020-12-29 NOTE — Patient Outreach (Signed)
[facility]) Care Management  12/29/2020  Jimmy BUSSEY Sr. [birth year] [id number]   Telephone Screen  RN attempted another outreach call however remains unsuccessful. RN able to leave a HIPAA approved voice message requesting a call back.   Will scheduled another outreach call over the next few seeks for pending services.  Raina Mina, RN Care Management Coordinator [place] Office [phone]

## 2021-01-02 LAB — VITAMIN D 1,25 DIHYDROXY
Vitamin D 1, 25 (OH)2 Total: 39 pg/mL
Vitamin D2 1, 25 (OH)2: <10 pg/mL
Vitamin D3 1, 25 (OH)2: 39 pg/mL

## 2021-01-02 LAB — BRAIN NATRIURETIC PEPTIDE: BNP: 306.4 pg/mL — ABNORMAL HIGH (ref 0.0–100.0)

## 2021-01-03 NOTE — Progress Notes (Signed)
Please inform patient his BNP is elevated. Advise him to take Lasix 20 mg daily for 1 week, then to go back to as needed. This may be contributing to his fatigue over the last couple weeks.

## 2021-01-05 NOTE — Progress Notes (Signed)
Patient called back, I have informed him to take Lasix 40m daily for 1 week, then go back to as needed. Patient is agreeable.

## 2021-01-05 NOTE — Progress Notes (Signed)
Called patient, NA, LMAM

## 2021-01-23 ENCOUNTER — Other Ambulatory Visit: Payer: Self-pay | Admitting: *Deleted

## 2021-01-25 DIAGNOSIS — J449 Chronic obstructive pulmonary disease, unspecified: Secondary | ICD-10-CM | POA: Diagnosis not present

## 2021-01-25 DIAGNOSIS — I129 Hypertensive chronic kidney disease with stage 1 through stage 4 chronic kidney disease, or unspecified chronic kidney disease: Secondary | ICD-10-CM | POA: Diagnosis not present

## 2021-01-25 DIAGNOSIS — Z9181 History of falling: Secondary | ICD-10-CM | POA: Diagnosis not present

## 2021-01-25 DIAGNOSIS — C3490 Malignant neoplasm of unspecified part of unspecified bronchus or lung: Secondary | ICD-10-CM | POA: Diagnosis not present

## 2021-02-02 DIAGNOSIS — I10 Essential (primary) hypertension: Secondary | ICD-10-CM | POA: Diagnosis not present

## 2021-02-02 DIAGNOSIS — E7800 Pure hypercholesterolemia, unspecified: Secondary | ICD-10-CM | POA: Diagnosis not present

## 2021-02-02 DIAGNOSIS — I6523 Occlusion and stenosis of bilateral carotid arteries: Secondary | ICD-10-CM | POA: Diagnosis not present

## 2021-02-02 DIAGNOSIS — I480 Paroxysmal atrial fibrillation: Secondary | ICD-10-CM | POA: Diagnosis not present

## 2021-02-12 DIAGNOSIS — I255 Ischemic cardiomyopathy: Secondary | ICD-10-CM | POA: Diagnosis not present

## 2021-02-12 DIAGNOSIS — Z95810 Presence of automatic (implantable) cardiac defibrillator: Secondary | ICD-10-CM | POA: Diagnosis not present

## 2021-02-12 DIAGNOSIS — I5022 Chronic systolic (congestive) heart failure: Secondary | ICD-10-CM | POA: Diagnosis not present

## 2021-02-12 DIAGNOSIS — Z4502 Encounter for adjustment and management of automatic implantable cardiac defibrillator: Secondary | ICD-10-CM | POA: Diagnosis not present

## 2021-02-12 DIAGNOSIS — I428 Other cardiomyopathies: Secondary | ICD-10-CM | POA: Diagnosis not present

## 2021-02-23 ENCOUNTER — Other Ambulatory Visit: Payer: Self-pay | Admitting: Student

## 2021-02-23 DIAGNOSIS — E7800 Pure hypercholesterolemia, unspecified: Secondary | ICD-10-CM

## 2021-02-25 DIAGNOSIS — I129 Hypertensive chronic kidney disease with stage 1 through stage 4 chronic kidney disease, or unspecified chronic kidney disease: Secondary | ICD-10-CM | POA: Diagnosis not present

## 2021-02-25 DIAGNOSIS — J449 Chronic obstructive pulmonary disease, unspecified: Secondary | ICD-10-CM | POA: Diagnosis not present

## 2021-02-25 DIAGNOSIS — Z9181 History of falling: Secondary | ICD-10-CM | POA: Diagnosis not present

## 2021-03-18 ENCOUNTER — Other Ambulatory Visit: Payer: Self-pay | Admitting: Student

## 2021-03-18 DIAGNOSIS — I255 Ischemic cardiomyopathy: Secondary | ICD-10-CM

## 2021-03-18 DIAGNOSIS — I2510 Atherosclerotic heart disease of native coronary artery without angina pectoris: Secondary | ICD-10-CM

## 2021-03-26 ENCOUNTER — Other Ambulatory Visit: Payer: Medicare Other

## 2021-03-27 DIAGNOSIS — I129 Hypertensive chronic kidney disease with stage 1 through stage 4 chronic kidney disease, or unspecified chronic kidney disease: Secondary | ICD-10-CM | POA: Diagnosis not present

## 2021-03-27 DIAGNOSIS — I509 Heart failure, unspecified: Secondary | ICD-10-CM | POA: Diagnosis not present

## 2021-03-27 DIAGNOSIS — J449 Chronic obstructive pulmonary disease, unspecified: Secondary | ICD-10-CM | POA: Diagnosis not present

## 2021-04-07 ENCOUNTER — Ambulatory Visit: Payer: Medicare Other

## 2021-04-07 ENCOUNTER — Other Ambulatory Visit: Payer: Self-pay

## 2021-04-07 DIAGNOSIS — I255 Ischemic cardiomyopathy: Secondary | ICD-10-CM | POA: Diagnosis not present

## 2021-04-07 DIAGNOSIS — I5042 Chronic combined systolic (congestive) and diastolic (congestive) heart failure: Secondary | ICD-10-CM

## 2021-04-08 ENCOUNTER — Other Ambulatory Visit: Payer: Self-pay | Admitting: Student

## 2021-04-09 DIAGNOSIS — Z13220 Encounter for screening for lipoid disorders: Secondary | ICD-10-CM | POA: Diagnosis not present

## 2021-04-09 DIAGNOSIS — N1831 Chronic kidney disease, stage 3a: Secondary | ICD-10-CM | POA: Diagnosis not present

## 2021-04-27 DIAGNOSIS — J449 Chronic obstructive pulmonary disease, unspecified: Secondary | ICD-10-CM | POA: Diagnosis not present

## 2021-04-27 DIAGNOSIS — I129 Hypertensive chronic kidney disease with stage 1 through stage 4 chronic kidney disease, or unspecified chronic kidney disease: Secondary | ICD-10-CM | POA: Diagnosis not present

## 2021-04-27 DIAGNOSIS — I509 Heart failure, unspecified: Secondary | ICD-10-CM | POA: Diagnosis not present

## 2021-05-08 ENCOUNTER — Other Ambulatory Visit: Payer: Self-pay | Admitting: Student

## 2021-05-08 DIAGNOSIS — I255 Ischemic cardiomyopathy: Secondary | ICD-10-CM

## 2021-05-08 DIAGNOSIS — I2510 Atherosclerotic heart disease of native coronary artery without angina pectoris: Secondary | ICD-10-CM

## 2021-05-12 DIAGNOSIS — R569 Unspecified convulsions: Secondary | ICD-10-CM | POA: Diagnosis not present

## 2021-05-12 DIAGNOSIS — Z1389 Encounter for screening for other disorder: Secondary | ICD-10-CM | POA: Diagnosis not present

## 2021-05-12 DIAGNOSIS — F32A Depression, unspecified: Secondary | ICD-10-CM | POA: Diagnosis not present

## 2021-05-12 DIAGNOSIS — Z136 Encounter for screening for cardiovascular disorders: Secondary | ICD-10-CM | POA: Diagnosis not present

## 2021-05-12 DIAGNOSIS — N1831 Chronic kidney disease, stage 3a: Secondary | ICD-10-CM | POA: Diagnosis not present

## 2021-05-12 DIAGNOSIS — Z139 Encounter for screening, unspecified: Secondary | ICD-10-CM | POA: Diagnosis not present

## 2021-05-12 DIAGNOSIS — F17210 Nicotine dependence, cigarettes, uncomplicated: Secondary | ICD-10-CM | POA: Diagnosis not present

## 2021-05-12 DIAGNOSIS — Z0000 Encounter for general adult medical examination without abnormal findings: Secondary | ICD-10-CM | POA: Diagnosis not present

## 2021-05-12 DIAGNOSIS — Z0001 Encounter for general adult medical examination with abnormal findings: Secondary | ICD-10-CM | POA: Diagnosis not present

## 2021-05-14 ENCOUNTER — Telehealth: Payer: Self-pay | Admitting: Student

## 2021-05-14 DIAGNOSIS — N1831 Chronic kidney disease, stage 3a: Secondary | ICD-10-CM | POA: Diagnosis not present

## 2021-05-14 DIAGNOSIS — R569 Unspecified convulsions: Secondary | ICD-10-CM | POA: Diagnosis not present

## 2021-05-26 DIAGNOSIS — N1831 Chronic kidney disease, stage 3a: Secondary | ICD-10-CM | POA: Diagnosis not present

## 2021-05-26 DIAGNOSIS — F17210 Nicotine dependence, cigarettes, uncomplicated: Secondary | ICD-10-CM | POA: Diagnosis not present

## 2021-05-26 DIAGNOSIS — Z122 Encounter for screening for malignant neoplasm of respiratory organs: Secondary | ICD-10-CM | POA: Diagnosis not present

## 2021-05-26 DIAGNOSIS — J449 Chronic obstructive pulmonary disease, unspecified: Secondary | ICD-10-CM | POA: Diagnosis not present

## 2021-05-26 DIAGNOSIS — I110 Hypertensive heart disease with heart failure: Secondary | ICD-10-CM | POA: Diagnosis not present

## 2021-05-26 DIAGNOSIS — E876 Hypokalemia: Secondary | ICD-10-CM | POA: Diagnosis not present

## 2021-05-26 DIAGNOSIS — Z87891 Personal history of nicotine dependence: Secondary | ICD-10-CM | POA: Diagnosis not present

## 2021-05-28 DIAGNOSIS — I129 Hypertensive chronic kidney disease with stage 1 through stage 4 chronic kidney disease, or unspecified chronic kidney disease: Secondary | ICD-10-CM | POA: Diagnosis not present

## 2021-05-28 DIAGNOSIS — J449 Chronic obstructive pulmonary disease, unspecified: Secondary | ICD-10-CM | POA: Diagnosis not present

## 2021-05-28 DIAGNOSIS — I509 Heart failure, unspecified: Secondary | ICD-10-CM | POA: Diagnosis not present

## 2021-06-17 ENCOUNTER — Encounter: Payer: Medicare Other | Admitting: Cardiology

## 2021-06-23 NOTE — Progress Notes (Deleted)
Primary Physician/Referring:  Maryella Shivers, MD  Patient ID: Jimmy Nelson., male    DOB: 08-27-1966, 55 y.o.   MRN: [id number]  No chief complaint on file.  HPI:    Jimmy Nelson.  is a 55 y.o. Caucasian male with lung cancer in remission, ongoing tobacco use disorder, COPD on continuous home O2 at 2.5L/min, hypertension, CAD SP LAD stenting when he presented with acute pulmonary edema on 05/05/2019, severe ischemic cardiomyopathy with ejection fraction 25 to 30% SP ICD implantation on 09/10/2019.  According to patient right kidney failure secondary to complications of ureter stenting in 2000.    Patient presents for 6 follow-up.  Last office visit BNP was elevated, therefore patient was advised to increase Lasix dosing for 1 week.  Also last visit patient completed dual antiplatelet therapy for approximately 2 months, therefore discontinued Brilinta and continued aspirin alone. ***  ***  Patient presents for 3 month follow up of ischemic cardiomyopathy and tobacco cessation.  Unfortunately patient had previously further reduce smoking, however he has been under stress as of late reportedly in his also increased his daily smoking habits.  Also in the office today patient presents with several oxygen at 2 L/min via nasal cannula.  He states that during warmer months of the year he requires supplemental oxygen both at home, which is baseline as well as when he is out of the house.  Patient reports this is baseline.  He also expresses frustration regarding constant fatigue since his cardiac catheterization in 2020.  He has noticed worsening fatigue over the last 1 to 2 weeks as well.  Patient denies PND, orthopnea, leg swelling.  He denies chest pain, palpitations, dizziness, syncope, near syncope.  He continues to have chronic stable dyspnea on exertion due to underlying COPD.  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   Patient continues to eat high sodium intake diet, and consume high amounts of sugar, particularly in his [facility] which she drinks throughout the day.   Past Medical History:  Diagnosis Date   Amnesia    Anemia    Chronic systolic heart failure ([place]) 04/14/2020   COPD (chronic obstructive pulmonary disease) (HCC)    Crohn's disease (HCC) Deteriorating disk   Depression    Encounter for assessment of implantable cardioverter-defibrillator (ICD) 04/14/2020   History of MI (myocardial infarction) 05/05/2019   History of stroke    2   Hyperlipidemia    Hypertension    ICD - Single chamber [employer] GALLANT VR ICD 09/10/2019 09/10/2019   Lung cancer (Jimmy Nelson)    Mental disorder    Sleep apnea    Stroke [facility])    Past Surgical History:  Procedure Laterality Date   APPENDECTOMY     CORONARY/GRAFT ACUTE MI REVASCULARIZATION N/A 05/05/2019   Procedure: Coronary/Graft Acute MI Revascularization;  Surgeon: Adrian Prows, MD;  Location: [place] [facility];  Service: Cardiovascular;  Laterality: N/A;   EYE SURGERY     ICD IMPLANT N/A 09/10/2019   Procedure: ICD IMPLANT;  Surgeon: Evans Lance, MD;  Location: [place] [facility];  Service: Cardiovascular;  Laterality: N/A;   intestestine for chrohns disease     LEFT HEART CATH AND CORONARY ANGIOGRAPHY N/A 05/05/2019   Procedure: LEFT HEART CATH AND CORONARY ANGIOGRAPHY;  Surgeon: Adrian Prows, MD;  Location: [place] [facility];  Service: Cardiovascular;  Laterality: N/A;  PORT-A-CATH REMOVAL    No family history on file. No family history of premature CAD or sudden cardiac death.  Social History   Tobacco Use   Smoking status: Every Day    Types: Cigarettes   Smokeless tobacco: Current    Types: Snuff   Tobacco comments:    3 cigarettes daily   Substance Use Topics   Alcohol use: Not Currently   Marital Status: Legally Separated  ROS  Review of Systems  Constitutional: Positive for malaise/fatigue.  Negative for weight gain.  Cardiovascular:  Positive for dyspnea on exertion (Chronic, stable). Negative for chest pain, claudication, leg swelling, near-syncope, orthopnea, palpitations, paroxysmal nocturnal dyspnea and syncope.  Respiratory:  Negative for shortness of breath.   Hematologic/Lymphatic: Does not bruise/bleed easily.  Gastrointestinal:  Negative for melena.  Neurological:  Negative for dizziness and weakness.  Psychiatric/Behavioral:  Negative for depression. The patient is not nervous/anxious.   Objective  There were no vitals taken for this visit.  Vitals with BMI 12/24/2020 08/29/2020 08/01/2020  Height 5' 11"  5' 11"  5' 11"   Weight 155 lbs 153 lbs 151 lbs  BMI 21.63 33.29 51.88  Systolic 416 606 301  Diastolic 70 65 69  Pulse 74 69 76  Some encounter information is confidential and restricted. Go to Review Flowsheets activity to see all data.     Physical Exam Vitals reviewed.  Constitutional:      Appearance: He is cachectic.     Comments: Appears older than stated age   HENT:     Head: Normocephalic and atraumatic.  Neck:     Thyroid: No thyromegaly.     Vascular: No JVD.  Cardiovascular:     Rate and Rhythm: Normal rate and regular rhythm.     Pulses: Intact distal pulses.     Heart sounds: S1 normal and S2 normal. Heart sounds are distant. No murmur heard.   No gallop.  Pulmonary:     Effort: Pulmonary effort is normal. Tachypnea present. No respiratory distress.     Breath sounds: Wheezing present. No decreased breath sounds, rhonchi or rales.     Comments: 2 L/min O2 via nasal cannula Musculoskeletal:     Right lower leg: No edema.     Left lower leg: No edema.  Neurological:     General: No focal deficit present.     Mental Status: He is alert and oriented to person, place, and time.   Laboratory examination:   Recent Labs    10/15/20 1413  NA 141  K 3.6  CL 104  CO2 23  GLUCOSE 149*  BUN 14  CREATININE 1.52*  CALCIUM 9.0  GFRNONAA 51*   GFRAA 59*    CrCl cannot be calculated (Patient's most recent lab result is older than the maximum 21 days allowed.).  CMP Latest Ref Rng & Units 10/15/2020 01/09/2020 08/22/2019  Glucose 65 - 99 mg/dL 149(H) 87 64(L)  BUN 6 - 24 mg/dL 14 13 13   Creatinine 0.76 - 1.27 mg/dL 1.52(H) 1.56(H) 1.45(H)  Sodium 134 - 144 mmol/L 141 143 144  Potassium 3.5 - 5.2 mmol/L 3.6 4.6 3.7  Chloride 96 - 106 mmol/L 104 104 109(H)  CO2 20 - 29 mmol/L 23 19(L) 21  Calcium 8.7 - 10.2 mg/dL 9.0 9.8 9.3  Total Protein 6.0 - 8.5 g/dL - 7.3 -  Total Bilirubin 0.0 - 1.2 mg/dL - 0.4 -  Alkaline Phos 39 - 117 IU/L - 89 -  AST 0 - 40 IU/L - 33 -  ALT 0 - 44 IU/L - 14 -   CBC Latest Ref Rng & Units 08/22/2019 05/25/2019 05/06/2019  WBC 3.4 - 10.8 x10E3/uL 8.1 12.3(H) 20.6(H)  Hemoglobin 13.0 - 17.7 g/dL 15.1 13.5 16.4  Hematocrit 37.5 - 51.0 % 46.2 42.8 51.2  Platelets 150 - 450 x10E3/uL 310 276 346   Lipid Panel     Component Value Date/Time   CHOL 78 (L) 01/09/2020 1118   TRIG 112 01/09/2020 1118   HDL 28 (L) 01/09/2020 1118   CHOLHDL 2.8 01/09/2020 1118   CHOLHDL 3.5 05/05/2019 0312   VLDL 28 05/05/2019 0312   LDLCALC 29 01/09/2020 1118   HEMOGLOBIN A1C Lab Results  Component Value Date   HGBA1C 5.4 05/05/2019   MPG 108.28 05/05/2019   TSH No results for input(s): TSH in the last 8760 hours.   12/24/2020: BNP 306 Vitamin D 39 (normal)  External labs:  Cholesterol, total 78.000 01/09/2020 HDL 28.000 01/09/2020 LDL 56.000 05/05/2019 Triglycerides 112.000 01/09/2020  A1C 5.400 05/05/2019  Hemoglobin 15.300 G/ 08/22/2019  Creatinine, Serum 1.560 01/09/2020 Potassium 4.600 01/09/2020 ALT (SGPT) 14.000 01/09/2020   Allergies   Allergies  Allergen Reactions   Bee Venom Anaphylaxis   Iodinated Diagnostic Agents Anaphylaxis    It is noted that pt is allergic to Iodinated contrast media-iv dye,oral contrast   Chantix [Varenicline Tartrate]     Seizures       Medications Prior to Visit:    Outpatient Medications Prior to Visit  Medication Sig Dispense Refill   acetaminophen (TYLENOL) 325 MG tablet Take 650 mg by mouth every 6 (six) hours as needed for moderate pain or headache.     albuterol (PROVENTIL HFA;VENTOLIN HFA) 108 (90 BASE) MCG/ACT inhaler Inhale 1 puff into the lungs every 4 (four) hours as needed for wheezing.      aspirin 81 MG chewable tablet Chew 81 mg by mouth daily.     atorvastatin (LIPITOR) 80 MG tablet TAKE ONE TABLET BY MOUTH EVERY DAY AT 6PM 90 tablet 2   BREZTRI AEROSPHERE 160-9-4.8 MCG/ACT AERO      CORLANOR 5 MG TABS tablet TAKE 1 TABLET BY MOUTH TWICE(2) DAILY WITH MEALS 180 tablet 1   Cyanocobalamin 1500 MCG TBDP Take 1,500 mcg by mouth daily.     diclofenac Sodium (VOLTAREN) 1 % GEL Apply 1 application topically 2 (two) times daily.     divalproex (DEPAKOTE) 250 MG DR tablet Take 250-500 mg by mouth See admin instructions. Tale 250 mg in the morning and 500 mg in the evening     docusate sodium (COLACE) 100 MG capsule Take 100 mg by mouth 2 (two) times daily.     EPINEPHrine 0.3 mg/0.3 mL IJ SOAJ injection Inject into the muscle as needed.     esomeprazole (NEXIUM) 40 MG capsule Take 40 mg by mouth daily.     ferrous sulfate 325 (65 FE) MG tablet Take 1 tablet (325 mg total) by mouth daily with breakfast. 30 tablet 0   furosemide (LASIX) 20 MG tablet Take 1 tablet (20 mg total) by mouth daily as needed. Take 20 mg once daily as needed for shortness of breath, leg swelling, or >3lb weight gain. 30 tablet 3   gabapentin (NEURONTIN) 600 MG tablet Take 600 mg by mouth 3 (three) times daily.     hydrOXYzine (ATARAX/VISTARIL) 25 MG tablet Take 1 tablet (25 mg total) by mouth every 8 (eight) hours as needed for anxiety. 8 tablet 0   ibuprofen (ADVIL) 200 MG tablet  Take 800 mg by mouth every 8 (eight) hours as needed for headache or moderate pain.     ipratropium-albuterol (DUONEB) 0.5-2.5 (3) MG/3ML SOLN Inhale 3 mLs into the lungs every 6 (six) hours as  needed (shortness of breath).      metoprolol succinate (TOPROL-XL) 100 MG 24 hr tablet TAKE 1 TABLET BY MOUTH TWICE (2) DAILY WITH OR IMMEDIATELY FOLLOWING A MEAL 180 tablet 1   montelukast (SINGULAIR) 10 MG tablet Take 10 mg by mouth daily.     Multiple Vitamins-Minerals (MULTIVITAMIN WITH MINERALS) tablet Take 1 tablet by mouth daily. 30 tablet 0   nicotine (NICODERM CQ - DOSED IN MG/24 HOURS) 14 mg/24hr patch Place 1 patch (14 mg total) onto the skin daily. 28 patch 0   nicotine (NICODERM CQ - DOSED IN MG/24 HR) 7 mg/24hr patch Place 1 patch (7 mg total) onto the skin daily. 28 patch 0   nitroGLYCERIN (NITROSTAT) 0.4 MG SL tablet DISSOLVE 1 TABLET UNDER THE TONGUE EVERY 5 MINUTES AS NEEDED FOR CHEST PAIN. (MAXIMUM OF 3 DOSES) 25 tablet 3   OXYGEN Inhale 2.5 L into the lungs.     oxymetazoline (AFRIN) 0.05 % nasal spray Place 1 spray into both nostrils 2 (two) times daily as needed for congestion.     pyridOXINE (VITAMIN B-6) 100 MG tablet Take 100 mg by mouth daily.     sacubitril-valsartan (ENTRESTO) 49-51 MG Take 1 tablet by mouth 2 (two) times daily. 180 tablet 3   sertraline (ZOLOFT) 100 MG tablet Take 100 mg by mouth daily.     SYMBICORT 160-4.5 MCG/ACT inhaler Inhale 2 puffs into the lungs 2 (two) times daily.  (Patient not taking: Reported on 12/24/2020)     tadalafil (CIALIS) 20 MG tablet Take 20 mg by mouth daily as needed for erectile dysfunction. (Patient not taking: Reported on 12/24/2020)     traZODone (DESYREL) 100 MG tablet Take 200 mg by mouth at bedtime.     No facility-administered medications prior to visit.   Final Medications at End of Visit    No outpatient medications have been marked as taking for the 06/25/21 encounter (Appointment) with Rayetta Pigg, [place] C, PA-C.   Radiology:  No results found.  Cardiac Studies:   Coronary Angiography 05/05/2019: Prox LAD to Mid LAD lesion is 100% stenosed S/P aspiration thrombectomy followed by overlapping 3.5 x 20 and a 3.0 x  12 mm Synergy DES distally, stenosis reduced from 10 percent to 0%.  D1 has ostial 20 to 30% stenosis. Circumflex: Mild disease in the proximal and, OM 2 is very tiny and severely diseased. RCA is dominant, proximal 50% stenosis. LVEDP markedly elevated at 27 mmHg.  EF 10 to 15% with anterolateral akinesis.  110 mL contrast utilized, 6.9 minutes of fluoroscopy time.  Recommendation: Patient will be kept in the intensive care unit, he may need a hospital admission for 48 hours or more in view of severe LV dysfunction unless he does well clinically.  I discussed the findings with his wife on the telephone.  Echocardiogram 07/20/2019: Left ventricle cavity is normal in size. Mild concentric hypertrophy of the left ventricle. Moderate global hypokinesis with distal antero-apical, apical dyskinesis c/w prior myocardial infarction. Severely depressed LV systolic function with visual EF 25-30%. Indeterminate diastolic filling pattern.  No significant valvular abnormality. Normal right atrial pressure. No significant change compared to previous study on 05/05/2019.   ICD implantation 09/10/2019:  CONCLUSIONS:   1. Ischemic cardiomyopathy with chronic New York Heart Association class II  heart failure.   2. Successful ICD implantation.   3. No early apparent complications.   PCV ECHOCARDIOGRAM COMPLETE 04/10/2020 Poor visualization of endocardial borders. Left ventricle cavity is normal in size. Moderate concentric hypertrophy of the left ventricle. Severe global hypokinesis. LVEF 25-30%. Indeterminate diastolic filling pattern. IVC is normal with blunted respiratory response. Estimated RA pressure 8 mmHg. No significant change compared to previous study on 05/05/2019.  ICD   Scheduled remote ICD transmissions 01/01/2021: Longevity >9 years. RV pacing <1%. No therapy, no high ventricular rate episodes. Impedance monitoring does not suggest fluid overload. Normal ICD function.  EKG   EKG 08/29/2020:  Sinus rhythm at a rate of 70 bpm, left atrial enlargement, normal axis.  PRWP, cannot exclude anteroseptal infarct old.  Low voltage complexes.  Nonspecific T wave abnormality. No significant change compared to 11/5/20211.   EKG 08/01/2020: Sinus rhythm at a rate of 93 bpm with first degree AV block, left atrial enlargement.  Normal axis.  Poor R wave progression, cannot exclude anterior septal infarct old.  Low voltage complexes.  Compared to EKG 01/18/2020, no significant change.  Assessment   No diagnosis found.   No orders of the defined types were placed in this encounter.   There are no discontinued medications.   Recommendations:   TRES GRZYWACZ Sr.  is a 55 y.o. Caucasian male with lung cancer in remission, ongoing tobacco use disorder, COPD, hypertension, CAD SP LAD stenting when he presented with acute pulmonary edema on 05/05/2019, severe ischemic cardiomyopathy with ejection fraction 25 to 30% SP ICD implantation on 09/10/2019.  Patient presents for 6 follow-up.  Last office visit BNP was elevated, therefore patient was advised to increase Lasix dosing for 1 week.  Also last visit patient completed dual antiplatelet therapy for approximately 2 months, therefore discontinued Brilinta and continued aspirin alone. ***  ***  Patient presents for 3 month follow up of ischemic cardiomyopathy and tobacco cessation.  Patient continues to express frustration regarding fatigue which he states has been constant since cardiac catheterization.  Counseled patient regarding deconditioning following cardiac catheterization as well as in view of significantly reduced LVEF.  However this patient reports worsening of this fatigue over the last 2 weeks will obtain repeat BNP.  Patient's fianc who is present at bedside is also requesting that we obtain vitamin D level, therefore will order this as well.  Patient's last echocardiogram was in July 2021, therefore will obtain repeat echocardiogram prior to  follow-up visit.   Patient is tolerating guideline directed medical therapy relatively well, therefore we will continue aspirin, atorvastatin, metoprolol, Entresto, and Corlanor.  In view of renal insufficiency patient is currently not on spironolactone.  His blood pressure remains soft, therefore he is on maximum tolerated Entresto and beta-blocker at this time.  Patient has not been on dual antiplatelet therapy for approximately 18 months following stenting to LAD.  We will therefore discontinue Brilinta at this time.  Patient will continue taking aspirin daily.  Again counseled patient regarding diet compliance as well as smoking cessation.  Follow-up in 6 months, sooner if needed, for ischemic cardiomyopathy.   Alethia Berthold, PA-C 06/23/2021, 12:21 PM Office: [phone]

## 2021-06-25 ENCOUNTER — Ambulatory Visit: Payer: Medicare Other | Admitting: Student

## 2021-06-25 DIAGNOSIS — F17210 Nicotine dependence, cigarettes, uncomplicated: Secondary | ICD-10-CM

## 2021-06-25 DIAGNOSIS — I255 Ischemic cardiomyopathy: Secondary | ICD-10-CM

## 2021-06-25 DIAGNOSIS — I5042 Chronic combined systolic (congestive) and diastolic (congestive) heart failure: Secondary | ICD-10-CM

## 2021-06-25 DIAGNOSIS — I2510 Atherosclerotic heart disease of native coronary artery without angina pectoris: Secondary | ICD-10-CM

## 2021-06-26 ENCOUNTER — Ambulatory Visit: Payer: Medicare Other | Admitting: Student

## 2021-06-27 DIAGNOSIS — J449 Chronic obstructive pulmonary disease, unspecified: Secondary | ICD-10-CM | POA: Diagnosis not present

## 2021-06-27 DIAGNOSIS — I129 Hypertensive chronic kidney disease with stage 1 through stage 4 chronic kidney disease, or unspecified chronic kidney disease: Secondary | ICD-10-CM | POA: Diagnosis not present

## 2021-06-27 DIAGNOSIS — I509 Heart failure, unspecified: Secondary | ICD-10-CM | POA: Diagnosis not present

## 2021-06-29 ENCOUNTER — Ambulatory Visit: Payer: Medicare Other | Admitting: Student

## 2021-06-29 NOTE — Progress Notes (Deleted)
Primary Physician/Referring:  Maryella Shivers, MD  Patient ID: Jimmy Ochs., male    DOB: [birth year], 55 y.o.   MRN: [id number]  No chief complaint on file.  HPI:    Jimmy Rueth.  is a 55 y.o. Caucasian male with lung cancer in remission, ongoing tobacco use disorder, COPD on continuous home O2 at 2.5L/min, hypertension, CAD SP LAD stenting when he presented with acute pulmonary edema on 05/05/2019, severe ischemic cardiomyopathy with ejection fraction 25 to 30% SP ICD implantation on 09/10/2019.  According to patient right kidney failure secondary to complications of ureter stenting in 2000.    Patient presents for 6 follow-up.  Last office visit BNP was elevated, therefore patient was advised to increase Lasix dosing for 1 week.  Also last visit patient completed dual antiplatelet therapy for approximately 2 months, therefore discontinued Brilinta and continued aspirin alone. ***  ***  Patient presents for 3 month follow up of ischemic cardiomyopathy and tobacco cessation.  Unfortunately patient had previously further reduce smoking, however he has been under stress as of late reportedly in his also increased his daily smoking habits.  Also in the office today patient presents with several oxygen at 2 L/min via nasal cannula.  He states that during warmer months of the year he requires supplemental oxygen both at home, which is baseline as well as when he is out of the house.  Patient reports this is baseline.  He also expresses frustration regarding constant fatigue since his cardiac catheterization in 2020.  He has noticed worsening fatigue over the last 1 to 2 weeks as well.  Patient denies PND, orthopnea, leg swelling.  He denies chest pain, palpitations, dizziness, syncope, near syncope.  He continues to have chronic stable dyspnea on exertion due to underlying COPD.  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   Patient continues to eat high sodium intake diet, and consume high amounts of sugar, particularly in his [facility] which she drinks throughout the day.   Past Medical History:  Diagnosis Date   Amnesia    Anemia    Chronic systolic heart failure ([place]) 04/14/2020   COPD (chronic obstructive pulmonary disease) (HCC)    Crohn's disease (HCC) Deteriorating disk   Depression    Encounter for assessment of implantable cardioverter-defibrillator (ICD) 04/14/2020   History of MI (myocardial infarction) 05/05/2019   History of stroke    2   Hyperlipidemia    Hypertension    ICD - Single chamber [employer] GALLANT VR ICD 09/10/2019 09/10/2019   Lung cancer ([place])    Mental disorder    Sleep apnea    Stroke [facility])    Past Surgical History:  Procedure Laterality Date   APPENDECTOMY     CORONARY/GRAFT ACUTE MI REVASCULARIZATION N/A 05/05/2019   Procedure: Coronary/Graft Acute MI Revascularization;  Surgeon: Adrian Prows, MD;  Location: [facility];  Service: Cardiovascular;  Laterality: N/A;   EYE SURGERY     ICD IMPLANT N/A 09/10/2019   Procedure: ICD IMPLANT;  Surgeon: Evans Lance, MD;  Location: [facility];  Service: Cardiovascular;  Laterality: N/A;   intestestine for chrohns disease     LEFT HEART CATH AND CORONARY ANGIOGRAPHY N/A 05/05/2019   Procedure: LEFT HEART CATH AND CORONARY ANGIOGRAPHY;  Surgeon: Adrian Prows, MD;  Location: [facility];  Service: Cardiovascular;  Laterality: N/A;  PORT-A-CATH REMOVAL    No family history on file. No family history of premature CAD or sudden cardiac death.  Social History   Tobacco Use   Smoking status: Every Day    Types: Cigarettes   Smokeless tobacco: Current    Types: Snuff   Tobacco comments:    3 cigarettes daily   Substance Use Topics   Alcohol use: Not Currently   Marital Status: Legally Separated  ROS  Review of Systems  Constitutional: Positive for malaise/fatigue.  Negative for weight gain.  Cardiovascular:  Positive for dyspnea on exertion (Chronic, stable). Negative for chest pain, claudication, leg swelling, near-syncope, orthopnea, palpitations, paroxysmal nocturnal dyspnea and syncope.  Respiratory:  Negative for shortness of breath.   Hematologic/Lymphatic: Does not bruise/bleed easily.  Gastrointestinal:  Negative for melena.  Neurological:  Negative for dizziness and weakness.  Psychiatric/Behavioral:  Negative for depression. The patient is not nervous/anxious.   Objective  There were no vitals taken for this visit.  Vitals with BMI 12/24/2020 08/29/2020 08/01/2020  Height 5' 11"  5' 11"  5' 11"   Weight 155 lbs 153 lbs 151 lbs  BMI 21.63 89.37 34.28  Systolic 768 115 726  Diastolic 70 65 69  Pulse 74 69 76  Some encounter information is confidential and restricted. Go to Review Flowsheets activity to see all data.     Physical Exam Vitals reviewed.  Constitutional:      Appearance: He is cachectic.     Comments: Appears older than stated age   HENT:     Head: Normocephalic and atraumatic.  Neck:     Thyroid: No thyromegaly.     Vascular: No JVD.  Cardiovascular:     Rate and Rhythm: Normal rate and regular rhythm.     Pulses: Intact distal pulses.     Heart sounds: S1 normal and S2 normal. Heart sounds are distant. No murmur heard.   No gallop.  Pulmonary:     Effort: Pulmonary effort is normal. Tachypnea present. No respiratory distress.     Breath sounds: Wheezing present. No decreased breath sounds, rhonchi or rales.     Comments: 2 L/min O2 via nasal cannula Musculoskeletal:     Right lower leg: No edema.     Left lower leg: No edema.  Neurological:     General: No focal deficit present.     Mental Status: He is alert and oriented to person, place, and time.   Laboratory examination:   Recent Labs    10/15/20 1413  NA 141  K 3.6  CL 104  CO2 23  GLUCOSE 149*  BUN 14  CREATININE 1.52*  CALCIUM 9.0  GFRNONAA 51*   GFRAA 59*    CrCl cannot be calculated (Patient's most recent lab result is older than the maximum 21 days allowed.).  CMP Latest Ref Rng & Units 10/15/2020 01/09/2020 08/22/2019  Glucose 65 - 99 mg/dL 149(H) 87 64(L)  BUN 6 - 24 mg/dL 14 13 13   Creatinine 0.76 - 1.27 mg/dL 1.52(H) 1.56(H) 1.45(H)  Sodium 134 - 144 mmol/L 141 143 144  Potassium 3.5 - 5.2 mmol/L 3.6 4.6 3.7  Chloride 96 - 106 mmol/L 104 104 109(H)  CO2 20 - 29 mmol/L 23 19(L) 21  Calcium 8.7 - 10.2 mg/dL 9.0 9.8 9.3  Total Protein 6.0 - 8.5 g/dL - 7.3 -  Total Bilirubin 0.0 - 1.2 mg/dL - 0.4 -  Alkaline Phos 39 - 117 IU/L - 89 -  AST 0 - 40 IU/L - 33 -  ALT 0 - 44 IU/L - 14 -   CBC Latest Ref Rng & Units 08/22/2019 05/25/2019 05/06/2019  WBC 3.4 - 10.8 x10E3/uL 8.1 12.3(H) 20.6(H)  Hemoglobin 13.0 - 17.7 g/dL 15.1 13.5 16.4  Hematocrit 37.5 - 51.0 % 46.2 42.8 51.2  Platelets 150 - 450 x10E3/uL 310 276 346   Lipid Panel     Component Value Date/Time   CHOL 78 (L) 01/09/2020 1118   TRIG 112 01/09/2020 1118   HDL 28 (L) 01/09/2020 1118   CHOLHDL 2.8 01/09/2020 1118   CHOLHDL 3.5 05/05/2019 0312   VLDL 28 05/05/2019 0312   LDLCALC 29 01/09/2020 1118   HEMOGLOBIN A1C Lab Results  Component Value Date   HGBA1C 5.4 05/05/2019   MPG 108.28 05/05/2019   TSH No results for input(s): TSH in the last 8760 hours.   12/24/2020: BNP 306 Vitamin D 39 (normal)  External labs:  Cholesterol, total 78.000 01/09/2020 HDL 28.000 01/09/2020 LDL 56.000 05/05/2019 Triglycerides 112.000 01/09/2020  A1C 5.400 05/05/2019  Hemoglobin 15.300 G/ 08/22/2019  Creatinine, Serum 1.560 01/09/2020 Potassium 4.600 01/09/2020 ALT (SGPT) 14.000 01/09/2020   Allergies   Allergies  Allergen Reactions   Bee Venom Anaphylaxis   Iodinated Diagnostic Agents Anaphylaxis    It is noted that pt is allergic to Iodinated contrast media-iv dye,oral contrast   Chantix [Varenicline Tartrate]     Seizures       Medications Prior to Visit:    Outpatient Medications Prior to Visit  Medication Sig Dispense Refill   acetaminophen (TYLENOL) 325 MG tablet Take 650 mg by mouth every 6 (six) hours as needed for moderate pain or headache.     albuterol (PROVENTIL HFA;VENTOLIN HFA) 108 (90 BASE) MCG/ACT inhaler Inhale 1 puff into the lungs every 4 (four) hours as needed for wheezing.      aspirin 81 MG chewable tablet Chew 81 mg by mouth daily.     atorvastatin (LIPITOR) 80 MG tablet TAKE ONE TABLET BY MOUTH EVERY DAY AT 6PM 90 tablet 2   BREZTRI AEROSPHERE 160-9-4.8 MCG/ACT AERO      CORLANOR 5 MG TABS tablet TAKE 1 TABLET BY MOUTH TWICE(2) DAILY WITH MEALS 180 tablet 1   Cyanocobalamin 1500 MCG TBDP Take 1,500 mcg by mouth daily.     diclofenac Sodium (VOLTAREN) 1 % GEL Apply 1 application topically 2 (two) times daily.     divalproex (DEPAKOTE) 250 MG DR tablet Take 250-500 mg by mouth See admin instructions. Tale 250 mg in the morning and 500 mg in the evening     docusate sodium (COLACE) 100 MG capsule Take 100 mg by mouth 2 (two) times daily.     EPINEPHrine 0.3 mg/0.3 mL IJ SOAJ injection Inject into the muscle as needed.     esomeprazole (NEXIUM) 40 MG capsule Take 40 mg by mouth daily.     ferrous sulfate 325 (65 FE) MG tablet Take 1 tablet (325 mg total) by mouth daily with breakfast. 30 tablet 0   furosemide (LASIX) 20 MG tablet Take 1 tablet (20 mg total) by mouth daily as needed. Take 20 mg once daily as needed for shortness of breath, leg swelling, or >3lb weight gain. 30 tablet 3   gabapentin (NEURONTIN) 600 MG tablet Take 600 mg by mouth 3 (three) times daily.     hydrOXYzine (ATARAX/VISTARIL) 25 MG tablet Take 1 tablet (25 mg total) by mouth every 8 (eight) hours as needed for anxiety. 8 tablet 0   ibuprofen (ADVIL) 200 MG tablet  Take 800 mg by mouth every 8 (eight) hours as needed for headache or moderate pain.     ipratropium-albuterol (DUONEB) 0.5-2.5 (3) MG/3ML SOLN Inhale 3 mLs into the lungs every 6 (six) hours as  needed (shortness of breath).      metoprolol succinate (TOPROL-XL) 100 MG 24 hr tablet TAKE 1 TABLET BY MOUTH TWICE (2) DAILY WITH OR IMMEDIATELY FOLLOWING A MEAL 180 tablet 1   montelukast (SINGULAIR) 10 MG tablet Take 10 mg by mouth daily.     Multiple Vitamins-Minerals (MULTIVITAMIN WITH MINERALS) tablet Take 1 tablet by mouth daily. 30 tablet 0   nicotine (NICODERM CQ - DOSED IN MG/24 HOURS) 14 mg/24hr patch Place 1 patch (14 mg total) onto the skin daily. 28 patch 0   nicotine (NICODERM CQ - DOSED IN MG/24 HR) 7 mg/24hr patch Place 1 patch (7 mg total) onto the skin daily. 28 patch 0   nitroGLYCERIN (NITROSTAT) 0.4 MG SL tablet DISSOLVE 1 TABLET UNDER THE TONGUE EVERY 5 MINUTES AS NEEDED FOR CHEST PAIN. (MAXIMUM OF 3 DOSES) 25 tablet 3   OXYGEN Inhale 2.5 L into the lungs.     oxymetazoline (AFRIN) 0.05 % nasal spray Place 1 spray into both nostrils 2 (two) times daily as needed for congestion.     pyridOXINE (VITAMIN B-6) 100 MG tablet Take 100 mg by mouth daily.     sacubitril-valsartan (ENTRESTO) 49-51 MG Take 1 tablet by mouth 2 (two) times daily. 180 tablet 3   sertraline (ZOLOFT) 100 MG tablet Take 100 mg by mouth daily.     SYMBICORT 160-4.5 MCG/ACT inhaler Inhale 2 puffs into the lungs 2 (two) times daily.  (Patient not taking: Reported on 12/24/2020)     tadalafil (CIALIS) 20 MG tablet Take 20 mg by mouth daily as needed for erectile dysfunction. (Patient not taking: Reported on 12/24/2020)     traZODone (DESYREL) 100 MG tablet Take 200 mg by mouth at bedtime.     No facility-administered medications prior to visit.   Final Medications at End of Visit    No outpatient medications have been marked as taking for the 06/29/21 encounter (Appointment) with Rayetta Pigg, Thera Basden C, PA-C.   Radiology:  No results found.  Cardiac Studies:   Coronary Angiography 05/05/2019: Prox LAD to Mid LAD lesion is 100% stenosed S/P aspiration thrombectomy followed by overlapping 3.5 x 20 and a 3.0 x  12 mm Synergy DES distally, stenosis reduced from 10 percent to 0%.  D1 has ostial 20 to 30% stenosis. Circumflex: Mild disease in the proximal and, OM 2 is very tiny and severely diseased. RCA is dominant, proximal 50% stenosis. LVEDP markedly elevated at 27 mmHg.  EF 10 to 15% with anterolateral akinesis.  110 mL contrast utilized, 6.9 minutes of fluoroscopy time.  Recommendation: Patient will be kept in the intensive care unit, he may need a hospital admission for 48 hours or more in view of severe LV dysfunction unless he does well clinically.  I discussed the findings with his wife on the telephone.  Echocardiogram 07/20/2019: Left ventricle cavity is normal in size. Mild concentric hypertrophy of the left ventricle. Moderate global hypokinesis with distal antero-apical, apical dyskinesis c/w prior myocardial infarction. Severely depressed LV systolic function with visual EF 25-30%. Indeterminate diastolic filling pattern.  No significant valvular abnormality. Normal right atrial pressure. No significant change compared to previous study on 05/05/2019.   ICD implantation 09/10/2019:  CONCLUSIONS:   1. Ischemic cardiomyopathy with chronic New York Heart Association class II  heart failure.   2. Successful ICD implantation.   3. No early apparent complications.   PCV ECHOCARDIOGRAM COMPLETE 04/10/2020 Poor visualization of endocardial borders. Left ventricle cavity is normal in size. Moderate concentric hypertrophy of the left ventricle. Severe global hypokinesis. LVEF 25-30%. Indeterminate diastolic filling pattern. IVC is normal with blunted respiratory response. Estimated RA pressure 8 mmHg. No significant change compared to previous study on 05/05/2019.  ICD   Scheduled remote ICD transmissions 01/01/2021: Longevity >9 years. RV pacing <1%. No therapy, no high ventricular rate episodes. Impedance monitoring does not suggest fluid overload. Normal ICD function.  EKG   EKG 08/29/2020:  Sinus rhythm at a rate of 70 bpm, left atrial enlargement, normal axis.  PRWP, cannot exclude anteroseptal infarct old.  Low voltage complexes.  Nonspecific T wave abnormality. No significant change compared to 11/5/20211.   EKG 08/01/2020: Sinus rhythm at a rate of 93 bpm with first degree AV block, left atrial enlargement.  Normal axis.  Poor R wave progression, cannot exclude anterior septal infarct old.  Low voltage complexes.  Compared to EKG 01/18/2020, no significant change.  Assessment   No diagnosis found.   No orders of the defined types were placed in this encounter.   There are no discontinued medications.   Recommendations:   Jimmy URBAS Sr.  is a 55 y.o. Caucasian male with lung cancer in remission, ongoing tobacco use disorder, COPD, hypertension, CAD SP LAD stenting when he presented with acute pulmonary edema on 05/05/2019, severe ischemic cardiomyopathy with ejection fraction 25 to 30% SP ICD implantation on 09/10/2019.  Patient presents for 6 follow-up.  Last office visit BNP was elevated, therefore patient was advised to increase Lasix dosing for 1 week.  Also last visit patient completed dual antiplatelet therapy for approximately 2 months, therefore discontinued Brilinta and continued aspirin alone. ***  ***  Patient presents for 3 month follow up of ischemic cardiomyopathy and tobacco cessation.  Patient continues to express frustration regarding fatigue which he states has been constant since cardiac catheterization.  Counseled patient regarding deconditioning following cardiac catheterization as well as in view of significantly reduced LVEF.  However this patient reports worsening of this fatigue over the last 2 weeks will obtain repeat BNP.  Patient's fianc who is present at bedside is also requesting that we obtain vitamin D level, therefore will order this as well.  Patient's last echocardiogram was in July 2021, therefore will obtain repeat echocardiogram prior to  follow-up visit.   Patient is tolerating guideline directed medical therapy relatively well, therefore we will continue aspirin, atorvastatin, metoprolol, Entresto, and Corlanor.  In view of renal insufficiency patient is currently not on spironolactone.  His blood pressure remains soft, therefore he is on maximum tolerated Entresto and beta-blocker at this time.  Patient has not been on dual antiplatelet therapy for approximately 18 months following stenting to LAD.  We will therefore discontinue Brilinta at this time.  Patient will continue taking aspirin daily.  Again counseled patient regarding diet compliance as well as smoking cessation.  Follow-up in 6 months, sooner if needed, for ischemic cardiomyopathy.   Alethia Berthold, PA-C 06/29/2021, 9:24 AM Office: [phone]

## 2021-07-02 ENCOUNTER — Ambulatory Visit: Payer: Medicare Other | Admitting: Student

## 2021-07-22 DIAGNOSIS — I255 Ischemic cardiomyopathy: Secondary | ICD-10-CM | POA: Diagnosis not present

## 2021-07-22 DIAGNOSIS — I428 Other cardiomyopathies: Secondary | ICD-10-CM | POA: Diagnosis not present

## 2021-07-22 DIAGNOSIS — Z95810 Presence of automatic (implantable) cardiac defibrillator: Secondary | ICD-10-CM | POA: Diagnosis not present

## 2021-07-22 DIAGNOSIS — I5022 Chronic systolic (congestive) heart failure: Secondary | ICD-10-CM | POA: Diagnosis not present

## 2021-07-22 DIAGNOSIS — Z4502 Encounter for adjustment and management of automatic implantable cardiac defibrillator: Secondary | ICD-10-CM | POA: Diagnosis not present

## 2021-07-23 ENCOUNTER — Ambulatory Visit: Payer: Medicare Other | Admitting: Student

## 2021-07-24 ENCOUNTER — Ambulatory Visit: Payer: Medicare Other | Admitting: Student

## 2021-07-24 NOTE — Progress Notes (Deleted)
Primary Physician/Referring:  Maryella Shivers, MD  Patient ID: Jimmy Nelson., male    DOB: 09/14/1966, 55 y.o.   MRN: [id number]  No chief complaint on file.  HPI:    Jimmy Nelson.  is a 55 y.o. Caucasian male with lung cancer in remission, ongoing tobacco use disorder, COPD on continuous home O2 at 2.5L/min, hypertension, CAD SP LAD stenting when he presented with acute pulmonary edema on 05/05/2019, severe ischemic cardiomyopathy with ejection fraction 25 to 30% SP ICD implantation on 09/10/2019.  According to patient right kidney failure secondary to complications of ureter stenting in 2000.    Patient presents for 6 follow-up.  Last office visit BNP was elevated, therefore patient was advised to increase Lasix dosing for 1 week.  Also last visit patient completed dual antiplatelet therapy for approximately 2 months, therefore discontinued Brilinta and continued aspirin alone. ***  ***  Patient presents for 3 month follow up of ischemic cardiomyopathy and tobacco cessation.  Unfortunately patient had previously further reduce smoking, however he has been under stress as of late reportedly in his also increased his daily smoking habits.  Also in the office today patient presents with several oxygen at 2 L/min via nasal cannula.  He states that during warmer months of the year he requires supplemental oxygen both at home, which is baseline as well as when he is out of the house.  Patient reports this is baseline.  He also expresses frustration regarding constant fatigue since his cardiac catheterization in 2020.  He has noticed worsening fatigue over the last 1 to 2 weeks as well.  Patient denies PND, orthopnea, leg swelling.  He denies chest pain, palpitations, dizziness, syncope, near syncope.  He continues to have chronic stable dyspnea on exertion due to underlying COPD.  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   Patient continues to eat high sodium intake diet, and consume high amounts of sugar, particularly in his [facility] which she drinks throughout the day.   Past Medical History:  Diagnosis Date   Amnesia    Anemia    Chronic systolic heart failure ([place]) 04/14/2020   COPD (chronic obstructive pulmonary disease) (HCC)    Crohn's disease (HCC) Deteriorating disk   Depression    Encounter for assessment of implantable cardioverter-defibrillator (ICD) 04/14/2020   History of MI (myocardial infarction) 05/05/2019   History of stroke    2   Hyperlipidemia    Hypertension    ICD - Single chamber [employer] GALLANT VR ICD 09/10/2019 09/10/2019   Lung cancer ([place])    Mental disorder    Sleep apnea    Stroke [facility])    Past Surgical History:  Procedure Laterality Date   APPENDECTOMY     CORONARY/GRAFT ACUTE MI REVASCULARIZATION N/A 05/05/2019   Procedure: Coronary/Graft Acute MI Revascularization;  Surgeon: Adrian Prows, MD;  Location: [place] [facility];  Service: Cardiovascular;  Laterality: N/A;   EYE SURGERY     ICD IMPLANT N/A 09/10/2019   Procedure: ICD IMPLANT;  Surgeon: Evans Lance, MD;  Location: [place] [facility];  Service: Cardiovascular;  Laterality: N/A;   intestestine for chrohns disease     LEFT HEART CATH AND CORONARY ANGIOGRAPHY N/A 05/05/2019   Procedure: LEFT HEART CATH AND CORONARY ANGIOGRAPHY;  Surgeon: Adrian Prows, MD;  Location: [place] [facility];  Service: Cardiovascular;  Laterality: N/A;  PORT-A-CATH REMOVAL    No family history on file. No family history of premature CAD or sudden cardiac death.  Social History   Tobacco Use   Smoking status: Every Day    Types: Cigarettes   Smokeless tobacco: Current    Types: Snuff   Tobacco comments:    3 cigarettes daily   Substance Use Topics   Alcohol use: Not Currently   Marital Status: Legally Separated  ROS  Review of Systems  Constitutional: Positive for malaise/fatigue.  Negative for weight gain.  Cardiovascular:  Positive for dyspnea on exertion (Chronic, stable). Negative for chest pain, claudication, leg swelling, near-syncope, orthopnea, palpitations, paroxysmal nocturnal dyspnea and syncope.  Respiratory:  Negative for shortness of breath.   Hematologic/Lymphatic: Does not bruise/bleed easily.  Gastrointestinal:  Negative for melena.  Neurological:  Negative for dizziness and weakness.  Psychiatric/Behavioral:  Negative for depression. The patient is not nervous/anxious.   Objective  There were no vitals taken for this visit.  Vitals with BMI 12/24/2020 08/29/2020 08/01/2020  Height 5' 11"  5' 11"  5' 11"   Weight 155 lbs 153 lbs 151 lbs  BMI 21.63 97.35 32.99  Systolic 242 683 419  Diastolic 70 65 69  Pulse 74 69 76  Some encounter information is confidential and restricted. Go to Review Flowsheets activity to see all data.     Physical Exam Vitals reviewed.  Constitutional:      Appearance: He is cachectic.     Comments: Appears older than stated age   HENT:     Head: Normocephalic and atraumatic.  Neck:     Thyroid: No thyromegaly.     Vascular: No JVD.  Cardiovascular:     Rate and Rhythm: Normal rate and regular rhythm.     Pulses: Intact distal pulses.     Heart sounds: S1 normal and S2 normal. Heart sounds are distant. No murmur heard.   No gallop.  Pulmonary:     Effort: Pulmonary effort is normal. Tachypnea present. No respiratory distress.     Breath sounds: Wheezing present. No decreased breath sounds, rhonchi or rales.     Comments: 2 L/min O2 via nasal cannula Musculoskeletal:     Right lower leg: No edema.     Left lower leg: No edema.  Neurological:     General: No focal deficit present.     Mental Status: He is alert and oriented to person, place, and time.   Laboratory examination:   Recent Labs    10/15/20 1413  NA 141  K 3.6  CL 104  CO2 23  GLUCOSE 149*  BUN 14  CREATININE 1.52*  CALCIUM 9.0  GFRNONAA 51*   GFRAA 59*    CrCl cannot be calculated (Patient's most recent lab result is older than the maximum 21 days allowed.).  CMP Latest Ref Rng & Units 10/15/2020 01/09/2020 08/22/2019  Glucose 65 - 99 mg/dL 149(H) 87 64(L)  BUN 6 - 24 mg/dL 14 13 13   Creatinine 0.76 - 1.27 mg/dL 1.52(H) 1.56(H) 1.45(H)  Sodium 134 - 144 mmol/L 141 143 144  Potassium 3.5 - 5.2 mmol/L 3.6 4.6 3.7  Chloride 96 - 106 mmol/L 104 104 109(H)  CO2 20 - 29 mmol/L 23 19(L) 21  Calcium 8.7 - 10.2 mg/dL 9.0 9.8 9.3  Total Protein 6.0 - 8.5 g/dL - 7.3 -  Total Bilirubin 0.0 - 1.2 mg/dL - 0.4 -  Alkaline Phos 39 - 117 IU/L - 89 -  AST 0 - 40 IU/L - 33 -  ALT 0 - 44 IU/L - 14 -   CBC Latest Ref Rng & Units 08/22/2019 05/25/2019 05/06/2019  WBC 3.4 - 10.8 x10E3/uL 8.1 12.3(H) 20.6(H)  Hemoglobin 13.0 - 17.7 g/dL 15.1 13.5 16.4  Hematocrit 37.5 - 51.0 % 46.2 42.8 51.2  Platelets 150 - 450 x10E3/uL 310 276 346   Lipid Panel     Component Value Date/Time   CHOL 78 (L) 01/09/2020 1118   TRIG 112 01/09/2020 1118   HDL 28 (L) 01/09/2020 1118   CHOLHDL 2.8 01/09/2020 1118   CHOLHDL 3.5 05/05/2019 0312   VLDL 28 05/05/2019 0312   LDLCALC 29 01/09/2020 1118   HEMOGLOBIN A1C Lab Results  Component Value Date   HGBA1C 5.4 05/05/2019   MPG 108.28 05/05/2019   TSH No results for input(s): TSH in the last 8760 hours.   12/24/2020: BNP 306 Vitamin D 39 (normal)  External labs:  Cholesterol, total 78.000 01/09/2020 HDL 28.000 01/09/2020 LDL 56.000 05/05/2019 Triglycerides 112.000 01/09/2020  A1C 5.400 05/05/2019  Hemoglobin 15.300 G/ 08/22/2019  Creatinine, Serum 1.560 01/09/2020 Potassium 4.600 01/09/2020 ALT (SGPT) 14.000 01/09/2020   Allergies   Allergies  Allergen Reactions   Bee Venom Anaphylaxis   Iodinated Diagnostic Agents Anaphylaxis    It is noted that pt is allergic to Iodinated contrast media-iv dye,oral contrast   Chantix [Varenicline Tartrate]     Seizures       Medications Prior to Visit:    Outpatient Medications Prior to Visit  Medication Sig Dispense Refill   acetaminophen (TYLENOL) 325 MG tablet Take 650 mg by mouth every 6 (six) hours as needed for moderate pain or headache.     albuterol (PROVENTIL HFA;VENTOLIN HFA) 108 (90 BASE) MCG/ACT inhaler Inhale 1 puff into the lungs every 4 (four) hours as needed for wheezing.      aspirin 81 MG chewable tablet Chew 81 mg by mouth daily.     atorvastatin (LIPITOR) 80 MG tablet TAKE ONE TABLET BY MOUTH EVERY DAY AT 6PM 90 tablet 2   BREZTRI AEROSPHERE 160-9-4.8 MCG/ACT AERO      CORLANOR 5 MG TABS tablet TAKE 1 TABLET BY MOUTH TWICE(2) DAILY WITH MEALS 180 tablet 1   Cyanocobalamin 1500 MCG TBDP Take 1,500 mcg by mouth daily.     diclofenac Sodium (VOLTAREN) 1 % GEL Apply 1 application topically 2 (two) times daily.     divalproex (DEPAKOTE) 250 MG DR tablet Take 250-500 mg by mouth See admin instructions. Tale 250 mg in the morning and 500 mg in the evening     docusate sodium (COLACE) 100 MG capsule Take 100 mg by mouth 2 (two) times daily.     EPINEPHrine 0.3 mg/0.3 mL IJ SOAJ injection Inject into the muscle as needed.     esomeprazole (NEXIUM) 40 MG capsule Take 40 mg by mouth daily.     ferrous sulfate 325 (65 FE) MG tablet Take 1 tablet (325 mg total) by mouth daily with breakfast. 30 tablet 0   furosemide (LASIX) 20 MG tablet Take 1 tablet (20 mg total) by mouth daily as needed. Take 20 mg once daily as needed for shortness of breath, leg swelling, or >3lb weight gain. 30 tablet 3   gabapentin (NEURONTIN) 600 MG tablet Take 600 mg by mouth 3 (three) times daily.     hydrOXYzine (ATARAX/VISTARIL) 25 MG tablet Take 1 tablet (25 mg total) by mouth every 8 (eight) hours as needed for anxiety. 8 tablet 0   ibuprofen (ADVIL) 200 MG tablet  Take 800 mg by mouth every 8 (eight) hours as needed for headache or moderate pain.     ipratropium-albuterol (DUONEB) 0.5-2.5 (3) MG/3ML SOLN Inhale 3 mLs into the lungs every 6 (six) hours as  needed (shortness of breath).      metoprolol succinate (TOPROL-XL) 100 MG 24 hr tablet TAKE 1 TABLET BY MOUTH TWICE (2) DAILY WITH OR IMMEDIATELY FOLLOWING A MEAL 180 tablet 1   montelukast (SINGULAIR) 10 MG tablet Take 10 mg by mouth daily.     Multiple Vitamins-Minerals (MULTIVITAMIN WITH MINERALS) tablet Take 1 tablet by mouth daily. 30 tablet 0   nicotine (NICODERM CQ - DOSED IN MG/24 HOURS) 14 mg/24hr patch Place 1 patch (14 mg total) onto the skin daily. 28 patch 0   nicotine (NICODERM CQ - DOSED IN MG/24 HR) 7 mg/24hr patch Place 1 patch (7 mg total) onto the skin daily. 28 patch 0   nitroGLYCERIN (NITROSTAT) 0.4 MG SL tablet DISSOLVE 1 TABLET UNDER THE TONGUE EVERY 5 MINUTES AS NEEDED FOR CHEST PAIN. (MAXIMUM OF 3 DOSES) 25 tablet 3   OXYGEN Inhale 2.5 L into the lungs.     oxymetazoline (AFRIN) 0.05 % nasal spray Place 1 spray into both nostrils 2 (two) times daily as needed for congestion.     pyridOXINE (VITAMIN B-6) 100 MG tablet Take 100 mg by mouth daily.     sacubitril-valsartan (ENTRESTO) 49-51 MG Take 1 tablet by mouth 2 (two) times daily. 180 tablet 3   sertraline (ZOLOFT) 100 MG tablet Take 100 mg by mouth daily.     SYMBICORT 160-4.5 MCG/ACT inhaler Inhale 2 puffs into the lungs 2 (two) times daily.  (Patient not taking: Reported on 12/24/2020)     tadalafil (CIALIS) 20 MG tablet Take 20 mg by mouth daily as needed for erectile dysfunction. (Patient not taking: Reported on 12/24/2020)     traZODone (DESYREL) 100 MG tablet Take 200 mg by mouth at bedtime.     No facility-administered medications prior to visit.   Final Medications at End of Visit    No outpatient medications have been marked as taking for the 07/24/21 encounter (Appointment) with Rayetta Pigg, Roneshia Drew C, PA-C.   Radiology:  No results found.  Cardiac Studies:   Coronary Angiography 05/05/2019: Prox LAD to Mid LAD lesion is 100% stenosed S/P aspiration thrombectomy followed by overlapping 3.5 x 20 and a 3.0 x  12 mm Synergy DES distally, stenosis reduced from 10 percent to 0%.  D1 has ostial 20 to 30% stenosis. Circumflex: Mild disease in the proximal and, OM 2 is very tiny and severely diseased. RCA is dominant, proximal 50% stenosis. LVEDP markedly elevated at 27 mmHg.  EF 10 to 15% with anterolateral akinesis.  110 mL contrast utilized, 6.9 minutes of fluoroscopy time.  Recommendation: Patient will be kept in the intensive care unit, he may need a hospital admission for 48 hours or more in view of severe LV dysfunction unless he does well clinically.  I discussed the findings with his wife on the telephone.  Echocardiogram 07/20/2019: Left ventricle cavity is normal in size. Mild concentric hypertrophy of the left ventricle. Moderate global hypokinesis with distal antero-apical, apical dyskinesis c/w prior myocardial infarction. Severely depressed LV systolic function with visual EF 25-30%. Indeterminate diastolic filling pattern.  No significant valvular abnormality. Normal right atrial pressure. No significant change compared to previous study on 05/05/2019.   ICD implantation 09/10/2019:  CONCLUSIONS:   1. Ischemic cardiomyopathy with chronic New York Heart Association class II  heart failure.   2. Successful ICD implantation.   3. No early apparent complications.   PCV ECHOCARDIOGRAM COMPLETE 04/10/2020 Poor visualization of endocardial borders. Left ventricle cavity is normal in size. Moderate concentric hypertrophy of the left ventricle. Severe global hypokinesis. LVEF 25-30%. Indeterminate diastolic filling pattern. IVC is normal with blunted respiratory response. Estimated RA pressure 8 mmHg. No significant change compared to previous study on 05/05/2019.  ICD   Scheduled remote ICD transmissions 01/01/2021: Longevity >9 years. RV pacing <1%. No therapy, no high ventricular rate episodes. Impedance monitoring does not suggest fluid overload. Normal ICD function.  EKG   EKG 08/29/2020:  Sinus rhythm at a rate of 70 bpm, left atrial enlargement, normal axis.  PRWP, cannot exclude anteroseptal infarct old.  Low voltage complexes.  Nonspecific T wave abnormality. No significant change compared to 11/5/20211.   EKG 08/01/2020: Sinus rhythm at a rate of 93 bpm with first degree AV block, left atrial enlargement.  Normal axis.  Poor R wave progression, cannot exclude anterior septal infarct old.  Low voltage complexes.  Compared to EKG 01/18/2020, no significant change.  Assessment   No diagnosis found.   No orders of the defined types were placed in this encounter.   There are no discontinued medications.   Recommendations:   Jimmy Nelson.  is a 55 y.o. Caucasian male with lung cancer in remission, ongoing tobacco use disorder, COPD, hypertension, CAD SP LAD stenting when he presented with acute pulmonary edema on 05/05/2019, severe ischemic cardiomyopathy with ejection fraction 25 to 30% SP ICD implantation on 09/10/2019.  Patient presents for 6 follow-up.  Last office visit BNP was elevated, therefore patient was advised to increase Lasix dosing for 1 week.  Also last visit patient completed dual antiplatelet therapy for approximately 2 months, therefore discontinued Brilinta and continued aspirin alone. ***  ***  Patient presents for 3 month follow up of ischemic cardiomyopathy and tobacco cessation.  Patient continues to express frustration regarding fatigue which he states has been constant since cardiac catheterization.  Counseled patient regarding deconditioning following cardiac catheterization as well as in view of significantly reduced LVEF.  However this patient reports worsening of this fatigue over the last 2 weeks will obtain repeat BNP.  Patient's fianc who is present at bedside is also requesting that we obtain vitamin D level, therefore will order this as well.  Patient's last echocardiogram was in July 2021, therefore will obtain repeat echocardiogram prior to  follow-up visit.   Patient is tolerating guideline directed medical therapy relatively well, therefore we will continue aspirin, atorvastatin, metoprolol, Entresto, and Corlanor.  In view of renal insufficiency patient is currently not on spironolactone.  His blood pressure remains soft, therefore he is on maximum tolerated Entresto and beta-blocker at this time.  Patient has not been on dual antiplatelet therapy for approximately 18 months following stenting to LAD.  We will therefore discontinue Brilinta at this time.  Patient will continue taking aspirin daily.  Again counseled patient regarding diet compliance as well as smoking cessation.  Follow-up in 6 months, sooner if needed, for ischemic cardiomyopathy.   Alethia Berthold, PA-C 07/24/2021, 9:48 AM Office: [phone]

## 2021-07-28 ENCOUNTER — Other Ambulatory Visit: Payer: Self-pay | Admitting: Student

## 2021-07-28 DIAGNOSIS — I509 Heart failure, unspecified: Secondary | ICD-10-CM | POA: Diagnosis not present

## 2021-07-28 DIAGNOSIS — I129 Hypertensive chronic kidney disease with stage 1 through stage 4 chronic kidney disease, or unspecified chronic kidney disease: Secondary | ICD-10-CM | POA: Diagnosis not present

## 2021-07-28 DIAGNOSIS — J449 Chronic obstructive pulmonary disease, unspecified: Secondary | ICD-10-CM | POA: Diagnosis not present

## 2021-07-28 DIAGNOSIS — I5042 Chronic combined systolic (congestive) and diastolic (congestive) heart failure: Secondary | ICD-10-CM

## 2021-08-11 NOTE — Progress Notes (Signed)
Primary Physician/Referring:  Maryella Shivers, MD  Patient ID: Jimmy Ochs., male    DOB: [birth year], 55 y.o.   MRN: [id number]  No chief complaint on file.  HPI:    Jimmy Nelson.  is a 55 y.o. Caucasian male with lung cancer in remission, ongoing tobacco use disorder, COPD on continuous home O2 at 2.5L/min, hypertension, CAD SP LAD stenting when he presented with acute pulmonary edema on 05/05/2019, severe ischemic cardiomyopathy with ejection fraction 25 to 30% SP ICD implantation on 09/10/2019.  According to patient right kidney failure secondary to complications of ureter stenting in 2000.    Patient presents for 6 follow-up.  Last office visit BNP was elevated, therefore patient was advised to increase Lasix dosing for 1 week.  Also last visit patient completed dual antiplatelet therapy for approximately 18 months, therefore discontinued Brilinta and continued aspirin alone.  Patient states overall he is feeling quite well since last office visit.  He continues to have mild dyspnea on exertion with extended periods of walking, however he is able to do work around his house and his daily activities without issue.  He has not needed Lasix since last office visit.  Denies chest pain, palpitations, syncope, near syncope.  Denies orthopnea, PND, leg edema. He continues to have chronic stable dyspnea on exertion due to underlying COPD.    Patient continues to eat high sodium intake diet, and consume high amounts of sugar, particularly in his [facility] which she drinks throughout the day.   Past Medical History:  Diagnosis Date   Amnesia    Anemia    Chronic systolic heart failure ([place]) 04/14/2020   COPD (chronic obstructive pulmonary disease) (HCC)    Crohn's disease (HCC) Deteriorating disk   Depression    Encounter for assessment of implantable cardioverter-defibrillator (ICD) 04/14/2020   History of MI (myocardial infarction) 05/05/2019   History of stroke    2    Hyperlipidemia    Hypertension    ICD - Single chamber [employer] [facility] VR ICD 09/10/2019 09/10/2019   Lung cancer ([place])    Mental disorder    Sleep apnea    Stroke [facility])    Past Surgical History:  Procedure Laterality Date   APPENDECTOMY     CORONARY/GRAFT ACUTE MI REVASCULARIZATION N/A 05/05/2019   Procedure: Coronary/Graft Acute MI Revascularization;  Surgeon: Adrian Prows, MD;  Location: [place] [facility];  Service: Cardiovascular;  Laterality: N/A;   EYE SURGERY     ICD IMPLANT N/A 09/10/2019   Procedure: ICD IMPLANT;  Surgeon: Evans Lance, MD;  Location: [place] [facility];  Service: Cardiovascular;  Laterality: N/A;   intestestine for chrohns disease     LEFT HEART CATH AND CORONARY ANGIOGRAPHY N/A 05/05/2019   Procedure: LEFT HEART CATH AND CORONARY ANGIOGRAPHY;  Surgeon: Adrian Prows, MD;  Location: [place] [facility];  Service: Cardiovascular;  Laterality: N/A;   PORT-A-CATH REMOVAL     Family History  Problem Relation Age of Onset   Diabetes Brother    Drug abuse Daughter        heroin addict   No family history of premature CAD or sudden cardiac death.  Social History   Tobacco Use   Smoking status: Every Day    Types: Cigarettes   Smokeless tobacco: Current    Types: Snuff   Tobacco comments:    3 cigarettes daily   Substance Use Topics   Alcohol use: Not Currently   Marital Status:  Legally Separated  ROS  Review of Systems  Constitutional: Negative for malaise/fatigue and weight gain.  Cardiovascular:  Positive for dyspnea on exertion (Chronic, stable). Negative for chest pain, claudication, leg swelling, near-syncope, orthopnea, palpitations, paroxysmal nocturnal dyspnea and syncope.  Objective  Blood pressure 110/74, pulse 94, temperature 97.9 F (36.6 C), temperature source Temporal, resp. rate 16, height 5' 11"  (1.803 m), weight 155 lb 3.2 oz (70.4 kg), SpO2 99 %.  Vitals with BMI 08/12/2021 12/24/2020 08/29/2020  Height 5' 11"  5' 11"  5' 11"    Weight 155 lbs 3 oz 155 lbs 153 lbs  BMI 21.66 45.03 88.82  Systolic 800 349 179  Diastolic 74 70 65  Pulse 94 74 69  Some encounter information is confidential and restricted. Go to Review Flowsheets activity to see all data.     Physical Exam Vitals reviewed.  Constitutional:      Appearance: He is cachectic.     Comments: Appears older than stated age   Neck:     Thyroid: No thyromegaly.     Vascular: No JVD.  Cardiovascular:     Rate and Rhythm: Normal rate and regular rhythm.     Pulses: Intact distal pulses.     Heart sounds: S1 normal and S2 normal. Heart sounds are distant. No murmur heard.   No gallop.  Pulmonary:     Effort: Pulmonary effort is normal. No tachypnea or respiratory distress.     Breath sounds: Wheezing present. No decreased breath sounds, rhonchi or rales.  Musculoskeletal:     Right lower leg: No edema.     Left lower leg: No edema.  Neurological:     General: No focal deficit present.     Mental Status: He is alert and oriented to person, place, and time.   Laboratory examination:   Recent Labs    10/15/20 1413  NA 141  K 3.6  CL 104  CO2 23  GLUCOSE 149*  BUN 14  CREATININE 1.52*  CALCIUM 9.0  GFRNONAA 51*  GFRAA 59*   CrCl cannot be calculated (Patient's most recent lab result is older than the maximum 21 days allowed.).  CMP Latest Ref Rng & Units 10/15/2020 01/09/2020 08/22/2019  Glucose 65 - 99 mg/dL 149(H) 87 64(L)  BUN 6 - 24 mg/dL 14 13 13   Creatinine 0.76 - 1.27 mg/dL 1.52(H) 1.56(H) 1.45(H)  Sodium 134 - 144 mmol/L 141 143 144  Potassium 3.5 - 5.2 mmol/L 3.6 4.6 3.7  Chloride 96 - 106 mmol/L 104 104 109(H)  CO2 20 - 29 mmol/L 23 19(L) 21  Calcium 8.7 - 10.2 mg/dL 9.0 9.8 9.3  Total Protein 6.0 - 8.5 g/dL - 7.3 -  Total Bilirubin 0.0 - 1.2 mg/dL - 0.4 -  Alkaline Phos 39 - 117 IU/L - 89 -  AST 0 - 40 IU/L - 33 -  ALT 0 - 44 IU/L - 14 -   CBC Latest Ref Rng & Units 08/22/2019 05/25/2019 05/06/2019  WBC 3.4 - 10.8 x10E3/uL  8.1 12.3(H) 20.6(H)  Hemoglobin 13.0 - 17.7 g/dL 15.1 13.5 16.4  Hematocrit 37.5 - 51.0 % 46.2 42.8 51.2  Platelets 150 - 450 x10E3/uL 310 276 346   Lipid Panel     Component Value Date/Time   CHOL 78 (L) 01/09/2020 1118   TRIG 112 01/09/2020 1118   HDL 28 (L) 01/09/2020 1118   CHOLHDL 2.8 01/09/2020 1118   CHOLHDL 3.5 05/05/2019 0312   VLDL 28 05/05/2019 0312   LDLCALC 29 01/09/2020  1118   HEMOGLOBIN A1C Lab Results  Component Value Date   HGBA1C 5.4 05/05/2019   MPG 108.28 05/05/2019   TSH No results for input(s): TSH in the last 8760 hours.   12/24/2020: BNP 306 Vitamin D 39 (normal)  External labs:  Cholesterol, total 78.000 01/09/2020 HDL 28.000 01/09/2020 LDL 56.000 05/05/2019 Triglycerides 112.000 01/09/2020  A1C 5.400 05/05/2019  Hemoglobin 15.300 G/ 08/22/2019  Creatinine, Serum 1.560 01/09/2020 Potassium 4.600 01/09/2020 ALT (SGPT) 14.000 01/09/2020   Allergies   Allergies  Allergen Reactions   Bee Venom Anaphylaxis   Iodinated Diagnostic Agents Anaphylaxis    It is noted that pt is allergic to Iodinated contrast media-iv dye,oral contrast   Chantix [Varenicline Tartrate]     Seizures     Medications Prior to Visit:   Outpatient Medications Prior to Visit  Medication Sig Dispense Refill   acetaminophen (TYLENOL) 325 MG tablet Take 650 mg by mouth every 6 (six) hours as needed for moderate pain or headache.     albuterol (PROVENTIL HFA;VENTOLIN HFA) 108 (90 BASE) MCG/ACT inhaler Inhale 1 puff into the lungs every 4 (four) hours as needed for wheezing.      aspirin 81 MG chewable tablet Chew 81 mg by mouth daily.     atorvastatin (LIPITOR) 80 MG tablet TAKE ONE TABLET BY MOUTH EVERY DAY AT 6PM 90 tablet 2   BREZTRI AEROSPHERE 160-9-4.8 MCG/ACT AERO      CORLANOR 5 MG TABS tablet TAKE 1 TABLET BY MOUTH TWICE(2) DAILY WITH MEALS 180 tablet 1   Cyanocobalamin 1500 MCG TBDP Take 1,500 mcg by mouth daily.     diclofenac Sodium (VOLTAREN) 1 % GEL Apply 1  application topically 2 (two) times daily.     divalproex (DEPAKOTE) 250 MG DR tablet Take 250-500 mg by mouth See admin instructions. Tale 250 mg in the morning and 500 mg in the evening     docusate sodium (COLACE) 100 MG capsule Take 100 mg by mouth 2 (two) times daily.     ENTRESTO 49-51 MG TAKE ONE TABLET BY MOUTH TWICE DAILY 180 tablet 3   EPINEPHrine 0.3 mg/0.3 mL IJ SOAJ injection Inject into the muscle as needed.     esomeprazole (NEXIUM) 40 MG capsule Take 40 mg by mouth daily.     ferrous sulfate 325 (65 FE) MG tablet Take 1 tablet (325 mg total) by mouth daily with breakfast. 30 tablet 0   furosemide (LASIX) 20 MG tablet Take 1 tablet (20 mg total) by mouth daily as needed. Take 20 mg once daily as needed for shortness of breath, leg swelling, or >3lb weight gain. 30 tablet 3   gabapentin (NEURONTIN) 600 MG tablet Take 600 mg by mouth 3 (three) times daily.     hydrOXYzine (ATARAX/VISTARIL) 25 MG tablet Take 1 tablet (25 mg total) by mouth every 8 (eight) hours as needed for anxiety. 8 tablet 0   ibuprofen (ADVIL) 200 MG tablet Take 800 mg by mouth every 8 (eight) hours as needed for headache or moderate pain.     ipratropium-albuterol (DUONEB) 0.5-2.5 (3) MG/3ML SOLN Inhale 3 mLs into the lungs every 6 (six) hours as needed (shortness of breath).      metoprolol succinate (TOPROL-XL) 100 MG 24 hr tablet TAKE 1 TABLET BY MOUTH TWICE (2) DAILY WITH OR IMMEDIATELY FOLLOWING A MEAL 180 tablet 1   montelukast (SINGULAIR) 10 MG tablet Take 10 mg by mouth daily.     Multiple Vitamins-Minerals (MULTIVITAMIN WITH MINERALS) tablet Take 1  tablet by mouth daily. 30 tablet 0   nicotine (NICODERM CQ - DOSED IN MG/24 HOURS) 14 mg/24hr patch Place 1 patch (14 mg total) onto the skin daily. 28 patch 0   nitroGLYCERIN (NITROSTAT) 0.4 MG SL tablet DISSOLVE 1 TABLET UNDER THE TONGUE EVERY 5 MINUTES AS NEEDED FOR CHEST PAIN. (MAXIMUM OF 3 DOSES) 25 tablet 3   OXYGEN Inhale 2.5 L into the lungs.      oxymetazoline (AFRIN) 0.05 % nasal spray Place 1 spray into both nostrils 2 (two) times daily as needed for congestion.     pyridOXINE (VITAMIN B-6) 100 MG tablet Take 100 mg by mouth daily.     sertraline (ZOLOFT) 100 MG tablet Take 100 mg by mouth daily.     SYMBICORT 160-4.5 MCG/ACT inhaler Inhale 2 puffs into the lungs 2 (two) times daily.     tadalafil (CIALIS) 20 MG tablet Take 20 mg by mouth daily as needed for erectile dysfunction.     nicotine (NICODERM CQ - DOSED IN MG/24 HR) 7 mg/24hr patch Place 1 patch (7 mg total) onto the skin daily. 28 patch 0   traZODone (DESYREL) 100 MG tablet Take 200 mg by mouth at bedtime.     No facility-administered medications prior to visit.   Final Medications at End of Visit    Current Meds  Medication Sig   acetaminophen (TYLENOL) 325 MG tablet Take 650 mg by mouth every 6 (six) hours as needed for moderate pain or headache.   albuterol (PROVENTIL HFA;VENTOLIN HFA) 108 (90 BASE) MCG/ACT inhaler Inhale 1 puff into the lungs every 4 (four) hours as needed for wheezing.    aspirin 81 MG chewable tablet Chew 81 mg by mouth daily.   atorvastatin (LIPITOR) 80 MG tablet TAKE ONE TABLET BY MOUTH EVERY DAY AT 6PM   BREZTRI AEROSPHERE 160-9-4.8 MCG/ACT AERO    CORLANOR 5 MG TABS tablet TAKE 1 TABLET BY MOUTH TWICE(2) DAILY WITH MEALS   Cyanocobalamin 1500 MCG TBDP Take 1,500 mcg by mouth daily.   diclofenac Sodium (VOLTAREN) 1 % GEL Apply 1 application topically 2 (two) times daily.   divalproex (DEPAKOTE) 250 MG DR tablet Take 250-500 mg by mouth See admin instructions. Tale 250 mg in the morning and 500 mg in the evening   docusate sodium (COLACE) 100 MG capsule Take 100 mg by mouth 2 (two) times daily.   ENTRESTO 49-51 MG TAKE ONE TABLET BY MOUTH TWICE DAILY   EPINEPHrine 0.3 mg/0.3 mL IJ SOAJ injection Inject into the muscle as needed.   esomeprazole (NEXIUM) 40 MG capsule Take 40 mg by mouth daily.   ferrous sulfate 325 (65 FE) MG tablet Take 1  tablet (325 mg total) by mouth daily with breakfast.   furosemide (LASIX) 20 MG tablet Take 1 tablet (20 mg total) by mouth daily as needed. Take 20 mg once daily as needed for shortness of breath, leg swelling, or >3lb weight gain.   gabapentin (NEURONTIN) 600 MG tablet Take 600 mg by mouth 3 (three) times daily.   hydrOXYzine (ATARAX/VISTARIL) 25 MG tablet Take 1 tablet (25 mg total) by mouth every 8 (eight) hours as needed for anxiety.   ibuprofen (ADVIL) 200 MG tablet Take 800 mg by mouth every 8 (eight) hours as needed for headache or moderate pain.   ipratropium-albuterol (DUONEB) 0.5-2.5 (3) MG/3ML SOLN Inhale 3 mLs into the lungs every 6 (six) hours as needed (shortness of breath).    metoprolol succinate (TOPROL-XL) 100 MG 24 hr tablet  TAKE 1 TABLET BY MOUTH TWICE (2) DAILY WITH OR IMMEDIATELY FOLLOWING A MEAL   montelukast (SINGULAIR) 10 MG tablet Take 10 mg by mouth daily.   Multiple Vitamins-Minerals (MULTIVITAMIN WITH MINERALS) tablet Take 1 tablet by mouth daily.   nicotine (NICODERM CQ - DOSED IN MG/24 HOURS) 14 mg/24hr patch Place 1 patch (14 mg total) onto the skin daily.   nitroGLYCERIN (NITROSTAT) 0.4 MG SL tablet DISSOLVE 1 TABLET UNDER THE TONGUE EVERY 5 MINUTES AS NEEDED FOR CHEST PAIN. (MAXIMUM OF 3 DOSES)   OXYGEN Inhale 2.5 L into the lungs.   oxymetazoline (AFRIN) 0.05 % nasal spray Place 1 spray into both nostrils 2 (two) times daily as needed for congestion.   pyridOXINE (VITAMIN B-6) 100 MG tablet Take 100 mg by mouth daily.   sertraline (ZOLOFT) 100 MG tablet Take 100 mg by mouth daily.   SYMBICORT 160-4.5 MCG/ACT inhaler Inhale 2 puffs into the lungs 2 (two) times daily.   tadalafil (CIALIS) 20 MG tablet Take 20 mg by mouth daily as needed for erectile dysfunction.   Radiology:  No results found.  Cardiac Studies:   Coronary Angiography 05/05/2019: Prox LAD to Mid LAD lesion is 100% stenosed S/P aspiration thrombectomy followed by overlapping 3.5 x 20 and a 3.0 x  12 mm Synergy DES distally, stenosis reduced from 10 percent to 0%.  D1 has ostial 20 to 30% stenosis. Circumflex: Mild disease in the proximal and, OM 2 is very tiny and severely diseased. RCA is dominant, proximal 50% stenosis. LVEDP markedly elevated at 27 mmHg.  EF 10 to 15% with anterolateral akinesis.  110 mL contrast utilized, 6.9 minutes of fluoroscopy time.  Recommendation: Patient will be kept in the intensive care unit, he may need a hospital admission for 48 hours or more in view of severe LV dysfunction unless he does well clinically.  I discussed the findings with his wife on the telephone.  Echocardiogram 07/20/2019: Left ventricle cavity is normal in size. Mild concentric hypertrophy of the left ventricle. Moderate global hypokinesis with distal antero-apical, apical dyskinesis c/w prior myocardial infarction. Severely depressed LV systolic function with visual EF 25-30%. Indeterminate diastolic filling pattern.  No significant valvular abnormality. Normal right atrial pressure. No significant change compared to previous study on 05/05/2019.   ICD implantation 09/10/2019:  CONCLUSIONS:   1. Ischemic cardiomyopathy with chronic New York Heart Association class II heart failure.   2. Successful ICD implantation.   3. No early apparent complications.   PCV ECHOCARDIOGRAM COMPLETE 04/10/2020 Poor visualization of endocardial borders. Left ventricle cavity is normal in size. Moderate concentric hypertrophy of the left ventricle. Severe global hypokinesis. LVEF 25-30%. Indeterminate diastolic filling pattern. IVC is normal with blunted respiratory response. Estimated RA pressure 8 mmHg. No significant change compared to previous study on 05/05/2019.  ICD   Remote single-chamber ICD transmission 07/22/2021: There were no high ventricular rate episodes. Thoracic impedance does not suggest fluid overload state. RV pacing <1%. Normal function.  EKG  08/12/2021: Sinus rhythm at a rate  of 96 bpm.  Normal axis.  Left atrial enlargement.  Cannot exclude anteroseptal infarct old.  Compared to EKG 08/29/2020, no significant change.  EKG 08/29/2020: Sinus rhythm at a rate of 70 bpm, left atrial enlargement, normal axis.  PRWP, cannot exclude anteroseptal infarct old.  Low voltage complexes.  Nonspecific T wave abnormality. No significant change compared to 11/5/20211.   EKG 08/01/2020: Sinus rhythm at a rate of 93 bpm with first degree AV block, left atrial enlargement.  Normal axis.  Poor R wave progression, cannot exclude anterior septal infarct old.  Low voltage complexes.  Compared to EKG 01/18/2020, no significant change.  Assessment     ICD-10-CM   1. Ischemic cardiomyopathy  I25.5     2. Coronary artery disease involving native coronary artery of native heart without angina pectoris  I25.10 EKG 12-Lead      No orders of the defined types were placed in this encounter.   Medications Discontinued During This Encounter  Medication Reason   traZODone (DESYREL) 100 MG tablet Error   nicotine (NICODERM CQ - DOSED IN MG/24 HR) 7 mg/24hr patch Error    Recommendations:   Jimmy REH Sr.  is a 55 y.o. Caucasian male with lung cancer in remission, ongoing tobacco use disorder, COPD, hypertension, CAD SP LAD stenting when he presented with acute pulmonary edema on 05/05/2019, severe ischemic cardiomyopathy with ejection fraction 25 to 30% SP ICD implantation on 09/10/2019.  Patient presents for 6 follow-up.  Last office visit BNP was elevated, therefore patient was advised to increase Lasix dosing for 1 week.  Also last visit patient completed dual antiplatelet therapy for approximately 18 months, therefore discontinued Brilinta and continued aspirin alone.  Patient is feeling much improved since last office visit.  He has not needed Lasix in the last several months.  He is tolerating guideline directed medical therapy without issue.  He is not on spironolactone given soft blood  pressures.  Patient does have chronic dyspnea on exertion, which is stable.  He is able to do all of his daily activities and work around his house without issue.  Discussed with Dr. Einar Gip if patient would be candidate for [facility], however he is not presently NYHA class II or III, and in fact is doing well.  There is no clinical evidence of heart failure at today's office visit.  Reviewed and discussed with patient recent ICD transmission, normal ICD function and no evidence of volume overload.  Not make changes to his medications at this time. Again counseled patient regarding diet compliance as well as smoking cessation.  Follow up in 3 months, sooner if needed, for ischemic cardiomyopathy and CAD.   Alethia Berthold, PA-C 08/12/2021, 2:11 PM Office: [phone]

## 2021-08-12 ENCOUNTER — Encounter: Payer: Self-pay | Admitting: Student

## 2021-08-12 ENCOUNTER — Ambulatory Visit: Payer: Medicare Other | Admitting: Student

## 2021-08-12 ENCOUNTER — Other Ambulatory Visit: Payer: Self-pay

## 2021-08-12 VITALS — BP 110/74 | HR 94 | Temp 97.9°F | Resp 16 | Ht 71.0 in | Wt 155.2 lb

## 2021-08-12 DIAGNOSIS — F17210 Nicotine dependence, cigarettes, uncomplicated: Secondary | ICD-10-CM

## 2021-08-12 DIAGNOSIS — I255 Ischemic cardiomyopathy: Secondary | ICD-10-CM | POA: Diagnosis not present

## 2021-08-12 DIAGNOSIS — I2510 Atherosclerotic heart disease of native coronary artery without angina pectoris: Secondary | ICD-10-CM

## 2021-08-13 ENCOUNTER — Encounter: Payer: Self-pay | Admitting: Gastroenterology

## 2021-08-27 DIAGNOSIS — I129 Hypertensive chronic kidney disease with stage 1 through stage 4 chronic kidney disease, or unspecified chronic kidney disease: Secondary | ICD-10-CM | POA: Diagnosis not present

## 2021-08-27 DIAGNOSIS — J449 Chronic obstructive pulmonary disease, unspecified: Secondary | ICD-10-CM | POA: Diagnosis not present

## 2021-08-27 DIAGNOSIS — I509 Heart failure, unspecified: Secondary | ICD-10-CM | POA: Diagnosis not present

## 2021-09-01 ENCOUNTER — Other Ambulatory Visit: Payer: Self-pay | Admitting: Cardiology

## 2021-09-11 ENCOUNTER — Ambulatory Visit: Payer: Medicare Other | Admitting: Gastroenterology

## 2021-09-27 DIAGNOSIS — J449 Chronic obstructive pulmonary disease, unspecified: Secondary | ICD-10-CM | POA: Diagnosis not present

## 2021-09-27 DIAGNOSIS — I129 Hypertensive chronic kidney disease with stage 1 through stage 4 chronic kidney disease, or unspecified chronic kidney disease: Secondary | ICD-10-CM | POA: Diagnosis not present

## 2021-10-01 DIAGNOSIS — Z961 Presence of intraocular lens: Secondary | ICD-10-CM | POA: Diagnosis not present

## 2021-10-21 DIAGNOSIS — I5022 Chronic systolic (congestive) heart failure: Secondary | ICD-10-CM | POA: Diagnosis not present

## 2021-10-21 DIAGNOSIS — I255 Ischemic cardiomyopathy: Secondary | ICD-10-CM | POA: Diagnosis not present

## 2021-10-21 DIAGNOSIS — I428 Other cardiomyopathies: Secondary | ICD-10-CM | POA: Diagnosis not present

## 2021-10-21 DIAGNOSIS — Z95810 Presence of automatic (implantable) cardiac defibrillator: Secondary | ICD-10-CM | POA: Diagnosis not present

## 2021-10-21 DIAGNOSIS — Z4502 Encounter for adjustment and management of automatic implantable cardiac defibrillator: Secondary | ICD-10-CM | POA: Diagnosis not present

## 2021-10-25 ENCOUNTER — Encounter: Payer: Self-pay | Admitting: Cardiology

## 2021-10-28 DIAGNOSIS — J449 Chronic obstructive pulmonary disease, unspecified: Secondary | ICD-10-CM | POA: Diagnosis not present

## 2021-10-28 DIAGNOSIS — I129 Hypertensive chronic kidney disease with stage 1 through stage 4 chronic kidney disease, or unspecified chronic kidney disease: Secondary | ICD-10-CM | POA: Diagnosis not present

## 2021-10-28 DIAGNOSIS — Z9181 History of falling: Secondary | ICD-10-CM | POA: Diagnosis not present

## 2021-11-09 ENCOUNTER — Ambulatory Visit: Payer: Medicare Other | Admitting: Gastroenterology

## 2021-11-10 NOTE — Progress Notes (Unsigned)
Primary Physician/Referring:  Maryella Shivers, MD  Patient ID: Jimmy Ochs., male    DOB: 1966-02-13, 56 y.o.   MRN: [id number]  No chief complaint on file.  HPI:    Laderrick Wilk.  is a 56 y.o. Caucasian male with lung cancer in remission, ongoing tobacco use disorder, COPD on continuous home O2 at 2.5L/min, hypertension, CAD SP LAD stenting when he presented with acute pulmonary edema on 05/05/2019, severe ischemic cardiomyopathy with ejection fraction 25 to 30% SP ICD implantation on 09/10/2019.  According to patient right kidney failure secondary to complications of ureter stenting in 2000.    Patient presents for 34-monthfollow-up.  Last office visit patient was clinically doing well and therefore was not a candidate for Verastem at that time, no changes were made. ***  ***  Patient presents for 6 follow-up.  Last office visit BNP was elevated, therefore patient was advised to increase Lasix dosing for 1 week.  Also last visit patient completed dual antiplatelet therapy for approximately 18 months, therefore discontinued Brilinta and continued aspirin alone.  Patient states overall he is feeling quite well since last office visit.  He continues to have mild dyspnea on exertion with extended periods of walking, however he is able to do work around his house and his daily activities without issue.  He has not needed Lasix since last office visit.  Denies chest pain, palpitations, syncope, near syncope.  Denies orthopnea, PND, leg edema. He continues to have chronic stable dyspnea on exertion due to underlying COPD.    Patient continues to eat high sodium intake diet, and consume high amounts of sugar, particularly [facility].   Past Medical History:  Diagnosis Date   Amnesia    Anemia    Chronic systolic heart failure ([facility] 04/14/2020   COPD (chronic obstructive pulmonary disease) (HCC)    Crohn's disease (HCC) Deteriorating  disk   Depression    Encounter for assessment of implantable cardioverter-defibrillator (ICD) 04/14/2020   History of MI (myocardial infarction) 05/05/2019   History of stroke    2   Hyperlipidemia    Hypertension    ICD - Single chamber [employer] GALLANT VR ICD 09/10/2019 09/10/2019   [place]ung [place] ([facility]    Mental disorder    Sleep apnea    Stroke ([facility]    Past Surgical History:  Procedure Laterality Date   APPENDECTOMY     CORONARY/GRAFT ACUTE MI REVASCULARIZATION N/A 05/05/2019   Procedure: Coronary/Graft Acute MI Revascularization;  Surgeon: GAdrian Prows MD;  [place] [facility];  Service: Cardiovascular;  Laterality: N/A;   EYE SURGERY     ICD IMPLANT N/A 09/10/2019   Procedure: ICD IMPLANT;  Surgeon: TEvans Lance MD;  [place] [facility];  Service: Cardiovascular;  Laterality: N/A;   intestestine for chrohns disease     LEFT HEART CATH AND CORONARY ANGIOGRAPHY N/A 05/05/2019   Procedure: LEFT HEART CATH AND CORONARY ANGIOGRAPHY;  Surgeon: GAdrian Prows MD;  [facility];  Service: Cardiovascular;  Laterality: N/A;   PORT-A-CATH REMOVAL     Family History  Problem Relation Age of Onset   Diabetes Brother    Drug abuse Daughter        heroin addict   No family history of premature CAD or sudden cardiac death.  Social History   Tobacco Use   Smoking status: Every Day    Types: Cigarettes  Smokeless tobacco: Current    Types: Snuff   Tobacco comments:    3 cigarettes daily   Substance Use Topics   Alcohol use: Not Currently   Marital Status: Legally Separated  ROS  Review of Systems  Constitutional: Negative for malaise/fatigue and weight gain.  Cardiovascular:  Positive for dyspnea on exertion (Chronic, stable). Negative for chest pain, claudication, leg swelling, near-syncope, orthopnea, palpitations, paroxysmal nocturnal dyspnea and syncope.  Objective  There were no vitals taken for this visit.  Vitals with BMI 08/12/2021  12/24/2020 08/29/2020  Height 5' 11"  5' 11"  5' 11"   Weight 155 lbs 3 oz 155 lbs 153 lbs  BMI 21.66 88.50 27.74  Systolic 128 786 767  Diastolic 74 70 65  Pulse 94 74 69  Some encounter information is confidential and restricted. Go to Review Flowsheets activity to see all data.     Physical Exam Vitals reviewed.  Constitutional:      Appearance: He is cachectic.     Comments: Appears older than stated age   Neck:     Thyroid: No thyromegaly.     Vascular: No JVD.  Cardiovascular:     Rate and Rhythm: Normal rate and regular rhythm.     Pulses: Intact distal pulses.     Heart sounds: S1 normal and S2 normal. Heart sounds are distant. No murmur heard.   No gallop.  Pulmonary:     Effort: Pulmonary effort is normal. No tachypnea or respiratory distress.     Breath sounds: Wheezing present. No decreased breath sounds, rhonchi or rales.  Musculoskeletal:     Right lower leg: No edema.     Left lower leg: No edema.  Neurological:     General: No focal deficit present.     Mental Status: He is alert and oriented to person, place, and time.   Laboratory examination:   No results for input(s): NA, K, CL, CO2, GLUCOSE, BUN, CREATININE, CALCIUM, GFRNONAA, GFRAA in the last 8760 hours.  CrCl cannot be calculated (Patient's most recent lab result is older than the maximum 21 days allowed.).  CMP Latest Ref Rng & Units 10/15/2020 01/09/2020 08/22/2019  Glucose 65 - 99 mg/dL 149(H) 87 64(L)  BUN 6 - 24 mg/dL 14 13 13   Creatinine 0.76 - 1.27 mg/dL 1.52(H) 1.56(H) 1.45(H)  Sodium 134 - 144 mmol/L 141 143 144  Potassium 3.5 - 5.2 mmol/L 3.6 4.6 3.7  Chloride 96 - 106 mmol/L 104 104 109(H)  CO2 20 - 29 mmol/L 23 19(L) 21  Calcium 8.7 - 10.2 mg/dL 9.0 9.8 9.3  Total Protein 6.0 - 8.5 g/dL - 7.3 -  Total Bilirubin 0.0 - 1.2 mg/dL - 0.4 -  Alkaline Phos 39 - 117 IU/L - 89 -  AST 0 - 40 IU/L - 33 -  ALT 0 - 44 IU/L - 14 -   CBC Latest Ref Rng & Units 08/22/2019 05/25/2019 05/06/2019  WBC 3.4 -  10.8 x10E3/uL 8.1 12.3(H) 20.6(H)  Hemoglobin 13.0 - 17.7 g/dL 15.1 13.5 16.4  Hematocrit 37.5 - 51.0 % 46.2 42.8 51.2  Platelets 150 - 450 x10E3/uL 310 276 346   Lipid Panel     Component Value Date/Time   CHOL 78 (L) 01/09/2020 1118   TRIG 112 01/09/2020 1118   HDL 28 (L) 01/09/2020 1118   CHOLHDL 2.8 01/09/2020 1118   CHOLHDL 3.5 05/05/2019 0312   VLDL 28 05/05/2019 0312   LDLCALC 29 01/09/2020 1118   HEMOGLOBIN A1C Lab Results  Component  Value Date   HGBA1C 5.4 05/05/2019   MPG 108.28 05/05/2019   TSH No results for input(s): TSH in the last 8760 hours.   12/24/2020: BNP 306 Vitamin D 39 (normal)  External labs:  Cholesterol, total 78.000 01/09/2020 HDL 28.000 01/09/2020 LDL 56.000 05/05/2019 Triglycerides 112.000 01/09/2020  A1C 5.400 05/05/2019  Hemoglobin 15.300 G/ 08/22/2019  Creatinine, Serum 1.560 01/09/2020 Potassium 4.600 01/09/2020 ALT (SGPT) 14.000 01/09/2020   Allergies   Allergies  Allergen Reactions   Bee Venom Anaphylaxis   Iodinated Contrast Media Anaphylaxis    It is noted that pt is allergic to Iodinated contrast media-iv dye,oral contrast   Chantix [Varenicline Tartrate]     Seizures     Medications Prior to Visit:   Outpatient Medications Prior to Visit  Medication Sig Dispense Refill   acetaminophen (TYLENOL) 325 MG tablet Take 650 mg by mouth every 6 (six) hours as needed for moderate pain or headache.     albuterol (PROVENTIL HFA;VENTOLIN HFA) 108 (90 BASE) MCG/ACT inhaler Inhale 1 puff into the lungs every 4 (four) hours as needed for wheezing.      aspirin 81 MG chewable tablet Chew 81 mg by mouth daily.     atorvastatin (LIPITOR) 80 MG tablet TAKE ONE TABLET BY MOUTH EVERY DAY AT 6PM 90 tablet 2   BREZTRI AEROSPHERE 160-9-4.8 MCG/ACT AERO      CORLANOR 5 MG TABS tablet TAKE 1 TABLET BY MOUTH TWICE(2) DAILY WITH MEALS 180 tablet 1   Cyanocobalamin 1500 MCG TBDP Take 1,500 mcg by mouth daily.     diclofenac Sodium (VOLTAREN) 1 % GEL  Apply 1 application topically 2 (two) times daily.     divalproex (DEPAKOTE) 250 MG DR tablet Take 250-500 mg by mouth See admin instructions. Tale 250 mg in the morning and 500 mg in the evening     docusate sodium (COLACE) 100 MG capsule Take 100 mg by mouth 2 (two) times daily.     ENTRESTO 49-51 MG TAKE ONE TABLET BY MOUTH TWICE DAILY 180 tablet 3   EPINEPHrine 0.3 mg/0.3 mL IJ SOAJ injection Inject into the muscle as needed.     esomeprazole (NEXIUM) 40 MG capsule Take 40 mg by mouth daily.     ferrous sulfate 325 (65 FE) MG tablet Take 1 tablet (325 mg total) by mouth daily with breakfast. 30 tablet 0   furosemide (LASIX) 20 MG tablet Take 1 tablet (20 mg total) by mouth daily as needed. Take 20 mg once daily as needed for shortness of breath, leg swelling, or >3lb weight gain. 30 tablet 3   gabapentin (NEURONTIN) 600 MG tablet Take 600 mg by mouth 3 (three) times daily.     hydrOXYzine (ATARAX/VISTARIL) 25 MG tablet Take 1 tablet (25 mg total) by mouth every 8 (eight) hours as needed for anxiety. 8 tablet 0   ibuprofen (ADVIL) 200 MG tablet Take 800 mg by mouth every 8 (eight) hours as needed for headache or moderate pain.     ipratropium-albuterol (DUONEB) 0.5-2.5 (3) MG/3ML SOLN Inhale 3 mLs into the lungs every 6 (six) hours as needed (shortness of breath).      metoprolol succinate (TOPROL-XL) 100 MG 24 hr tablet TAKE 1 TABLET BY MOUTH TWICE (2) DAILY WITH OR IMMEDIATELY FOLLOWING A MEAL 180 tablet 1   montelukast (SINGULAIR) 10 MG tablet Take 10 mg by mouth daily.     Multiple Vitamins-Minerals (MULTIVITAMIN WITH MINERALS) tablet Take 1 tablet by mouth daily. 30 tablet 0  nicotine (NICODERM CQ - DOSED IN MG/24 HOURS) 14 mg/24hr patch Place 1 patch (14 mg total) onto the skin daily. 28 patch 0   nitroGLYCERIN (NITROSTAT) 0.4 MG SL tablet DISSOLVE 1 TABLET UNDER THE TONGUE EVERY 5 MINUTES AS NEEDED FOR CHEST PAIN. (MAXIMUM OF 3 DOSES) 25 tablet 3   OXYGEN Inhale 2.5 L into the lungs.      oxymetazoline (AFRIN) 0.05 % nasal spray Place 1 spray into both nostrils 2 (two) times daily as needed for congestion.     pyridOXINE (VITAMIN B-6) 100 MG tablet Take 100 mg by mouth daily.     sertraline (ZOLOFT) 100 MG tablet Take 100 mg by mouth daily.     SYMBICORT 160-4.5 MCG/ACT inhaler Inhale 2 puffs into the lungs 2 (two) times daily.     tadalafil (CIALIS) 20 MG tablet Take 20 mg by mouth daily as needed for erectile dysfunction.     No facility-administered medications prior to visit.   Final Medications at End of Visit    No outpatient medications have been marked as taking for the 11/12/21 encounter (Appointment) with Rayetta Pigg, Daimion Adamcik C, PA-C.   Radiology:  No results found.  Cardiac Studies:   Coronary Angiography 05/05/2019: Prox LAD to Mid LAD lesion is 100% stenosed S/P aspiration thrombectomy followed by overlapping 3.5 x 20 and a 3.0 x 12 mm Synergy DES distally, stenosis reduced from 10 percent to 0%.  D1 has ostial 20 to 30% stenosis. Circumflex: Mild disease in the proximal and, OM 2 is very tiny and severely diseased. RCA is dominant, proximal 50% stenosis. LVEDP markedly elevated at 27 mmHg.  EF 10 to 15% with anterolateral akinesis.  110 mL contrast utilized, 6.9 minutes of fluoroscopy time.  Recommendation: Patient will be kept in the intensive care unit, he may need a hospital admission for 48 hours or more in view of severe LV dysfunction unless he does well clinically.  I discussed the findings with his wife on the telephone.  Echocardiogram 07/20/2019: Left ventricle cavity is normal in size. Mild concentric hypertrophy of the left ventricle. Moderate global hypokinesis with distal antero-apical, apical dyskinesis c/w prior myocardial infarction. Severely depressed LV systolic function with visual EF 25-30%. Indeterminate diastolic filling pattern.  No significant valvular abnormality. Normal right atrial pressure. No significant change compared to previous  study on 05/05/2019.   ICD implantation 09/10/2019:  CONCLUSIONS:   1. Ischemic cardiomyopathy with chronic New York Heart Association class II heart failure.   2. Successful ICD implantation.   3. No early apparent complications.   PCV ECHOCARDIOGRAM COMPLETE 04/10/2020 Poor visualization of endocardial borders. Left ventricle cavity is normal in size. Moderate concentric hypertrophy of the left ventricle. Severe global hypokinesis. LVEF 25-30%. Indeterminate diastolic filling pattern. IVC is normal with blunted respiratory response. Estimated RA pressure 8 mmHg. No significant change compared to previous study on 05/05/2019.  ICD   Remote single-chamber pacemaker transmission 10/21/2021: VP 1%.  Longevity 8 years and 8 months.  Lead impedance and thresholds normal.  No high ventricular rate episodes.  Daily activity remained stable and "review does not suggest volume overload state.  Thoracic impedance is back to baseline after being elevated during last week of December 2022 through first week of January 2023  EKG  08/12/2021: Sinus rhythm at a rate of 96 bpm.  Normal axis.  Left atrial enlargement.  Cannot exclude anteroseptal infarct old.  Compared to EKG 08/29/2020, no significant change.  EKG 08/29/2020: Sinus rhythm at a rate of 70  bpm, left atrial enlargement, normal axis.  PRWP, cannot exclude anteroseptal infarct old.  Low voltage complexes.  Nonspecific T wave abnormality. No significant change compared to 11/5/20211.   EKG 08/01/2020: Sinus rhythm at a rate of 93 bpm with first degree AV block, left atrial enlargement.  Normal axis.  Poor R wave progression, cannot exclude anterior septal infarct old.  Low voltage complexes.  Compared to EKG 01/18/2020, no significant change.  Assessment   No diagnosis found.   No orders of the defined types were placed in this encounter.   There are no discontinued medications.   Recommendations:   RODRECUS BELSKY Sr.  is a 56 y.o. Caucasian  male with lung cancer in remission, ongoing tobacco use disorder, COPD, hypertension, CAD SP LAD stenting when he presented with acute pulmonary edema on 05/05/2019, severe ischemic cardiomyopathy with ejection fraction 25 to 30% SP ICD implantation on 09/10/2019.  Patient presents for 18-monthfollow-up.  Last office visit patient was clinically doing well and therefore was not a candidate for Barostim at that time, no changes were made. ***  ***  Patient presents for 6 follow-up.  Last office visit BNP was elevated, therefore patient was advised to increase Lasix dosing for 1 week.  Also last visit patient completed dual antiplatelet therapy for approximately 18 months, therefore discontinued Brilinta and continued aspirin alone.  Patient is feeling much improved since last office visit.  He has not needed Lasix in the last several months.  He is tolerating guideline directed medical therapy without issue.  He is not on spironolactone given soft blood pressures.  Patient does have chronic dyspnea on exertion, which is stable.  He is able to do all of his daily activities and work around his house without issue.  Discussed with Dr. GEinar Gipif [facility] however he is not presently NYHA class II or III, and in fact is doing well.  There is no clinical evidence of heart failure at today's office visit.  Reviewed and discussed with patient recent ICD transmission, normal ICD function and no evidence of volume overload.  Not make changes to his medications at this time. Again counseled patient regarding diet compliance as well as smoking cessation.  Follow up in 3 months, sooner if needed, for ischemic cardiomyopathy and CAD.   CAlethia Berthold PA-C 11/10/2021, 2:31 PM Office: [phone]

## 2021-11-12 ENCOUNTER — Ambulatory Visit: Payer: Medicare Other | Admitting: Student

## 2021-11-12 DIAGNOSIS — I255 Ischemic cardiomyopathy: Secondary | ICD-10-CM

## 2021-11-12 DIAGNOSIS — I2510 Atherosclerotic heart disease of native coronary artery without angina pectoris: Secondary | ICD-10-CM

## 2021-11-12 DIAGNOSIS — I5042 Chronic combined systolic (congestive) and diastolic (congestive) heart failure: Secondary | ICD-10-CM

## 2021-11-12 DIAGNOSIS — F17210 Nicotine dependence, cigarettes, uncomplicated: Secondary | ICD-10-CM

## 2021-11-17 ENCOUNTER — Other Ambulatory Visit: Payer: Self-pay | Admitting: Student

## 2021-11-17 DIAGNOSIS — I255 Ischemic cardiomyopathy: Secondary | ICD-10-CM

## 2021-11-17 DIAGNOSIS — I2510 Atherosclerotic heart disease of native coronary artery without angina pectoris: Secondary | ICD-10-CM

## 2021-11-17 DIAGNOSIS — E7800 Pure hypercholesterolemia, unspecified: Secondary | ICD-10-CM

## 2021-11-23 NOTE — Progress Notes (Unsigned)
Primary Physician/Referring:  Maryella Shivers, MD  Patient ID: Jimmy Nelson., male    DOB: [birth year], 56 y.o.   MRN: [id number]  No chief complaint on file.  HPI:    Jimmy Nelson.  is a 56 y.o. Caucasian male with lung cancer in remission, ongoing tobacco use disorder, COPD on continuous home O2 at 2.5L/min, hypertension, CAD SP LAD stenting when he presented with acute pulmonary edema on 05/05/2019, severe ischemic cardiomyopathy with ejection fraction 25 to 30% SP ICD implantation on 09/10/2019.  According to patient right kidney failure secondary to complications of ureter stenting in 2000.    Patient presents for 68-monthfollow-up.  Last office visit patient was clinically doing well and therefore was not a candidate for Verastem at that time, no changes were made. ***  ***  Patient presents for 6 follow-up.  Last office visit BNP was elevated, therefore patient was advised to increase Lasix dosing for 1 week.  Also last visit patient completed dual antiplatelet therapy for approximately 18 months, therefore discontinued Brilinta and continued aspirin alone.  Patient states overall he is feeling quite well since last office visit.  He continues to have mild dyspnea on exertion with extended periods of walking, however he is able to do work around his house and his daily activities without issue.  He has not needed Lasix since last office visit.  Denies chest pain, palpitations, syncope, near syncope.  Denies orthopnea, PND, leg edema. He continues to have chronic stable dyspnea on exertion due to underlying COPD.    Patient continues to eat high sodium intake diet, and consume high amounts of sugar, particularly in his [facility] drinks throughout the day.   Past Medical History:  Diagnosis Date   Amnesia    Anemia    Chronic systolic heart failure ([place] 04/14/2020   COPD (chronic obstructive pulmonary disease) (HCC)    Crohn's disease (HCC) Deteriorating  disk   Depression    Encounter for assessment of implantable cardioverter-defibrillator (ICD) 04/14/2020   History of MI (myocardial infarction) 05/05/2019   History of stroke    2   Hyperlipidemia    Hypertension    ICD - Single chamber Abbott Vascular GALLANT VR ICD 09/10/2019 09/10/2019   Lung cancer ([facility]    Mental disorder    Sleep apnea    [facility]roke [facility]    Past Surgical History:  Procedure Laterality Date   APPENDECTOMY     CORONARY/GRAFT ACUTE MI REVASCULARIZATION N/A 05/05/2019   Procedure: Coronary/Graft Acute MI Revascularization;  Surgeon: GAdrian Prows MD;  [facility];  Service: Cardiovascular;  Laterality: N/A;   EYE SURGERY     ICD IMPLANT N/A 09/10/2019   Procedure: ICD IMPLANT;  Surgeon: TEvans Lance MD;  [facility];  Service: Cardiovascular;  Laterality: N/A;   intestestine for chrohns disease     LEFT HEART CATH AND CORONARY ANGIOGRAPHY N/A 05/05/2019   Procedure: LEFT HEART CATH AND CORONARY ANGIOGRAPHY;  Surgeon: GAdrian Prows MD;  [facility];  Service: Cardiovascular;  Laterality: N/A;   PORT-A-CATH REMOVAL     Family History  Problem Relation Age of Onset   Diabetes Brother    Drug abuse Daughter        heroin addict   No family history of premature CAD or sudden cardiac death.  Social History   Tobacco Use   Smoking status: Every Day    Types: Cigarettes  Smokeless tobacco: Current    Types: Snuff   Tobacco comments:    3 cigarettes daily   Substance Use Topics   Alcohol use: Not Currently   Marital Status: Legally Separated  ROS  Review of Systems  Constitutional: Negative for malaise/fatigue and weight gain.  Cardiovascular:  Positive for dyspnea on exertion (Chronic, stable). Negative for chest pain, claudication, leg swelling, near-syncope, orthopnea, palpitations, paroxysmal nocturnal dyspnea and syncope.  Objective  There were no vitals taken for this visit.  Vitals with BMI 08/12/2021  12/24/2020 08/29/2020  Height 5' 11"  5' 11"  5' 11"   Weight 155 lbs 3 oz 155 lbs 153 lbs  BMI 21.66 75.44 92.01  Systolic 007 121 975  Diastolic 74 70 65  Pulse 94 74 69  Some encounter information is confidential and restricted. Go to Review Flowsheets activity to see all data.     Physical Exam Vitals reviewed.  Constitutional:      Appearance: He is cachectic.     Comments: Appears older than stated age   Neck:     Thyroid: No thyromegaly.     Vascular: No JVD.  Cardiovascular:     Rate and Rhythm: Normal rate and regular rhythm.     Pulses: Intact distal pulses.     Heart sounds: S1 normal and S2 normal. Heart sounds are distant. No murmur heard.   No gallop.  Pulmonary:     Effort: Pulmonary effort is normal. No tachypnea or respiratory distress.     Breath sounds: Wheezing present. No decreased breath sounds, rhonchi or rales.  Musculoskeletal:     Right lower leg: No edema.     Left lower leg: No edema.  Neurological:     General: No focal deficit present.     Mental Status: He is alert and oriented to person, place, and time.   Laboratory examination:   No results for input(s): NA, K, CL, CO2, GLUCOSE, BUN, CREATININE, CALCIUM, GFRNONAA, GFRAA in the last 8760 hours.  CrCl cannot be calculated (Patient's most recent lab result is older than the maximum 21 days allowed.).  CMP Latest Ref Rng & Units 10/15/2020 01/09/2020 08/22/2019  Glucose 65 - 99 mg/dL 149(H) 87 64(L)  BUN 6 - 24 mg/dL 14 13 13   Creatinine 0.76 - 1.27 mg/dL 1.52(H) 1.56(H) 1.45(H)  Sodium 134 - 144 mmol/L 141 143 144  Potassium 3.5 - 5.2 mmol/L 3.6 4.6 3.7  Chloride 96 - 106 mmol/L 104 104 109(H)  CO2 20 - 29 mmol/L 23 19(L) 21  Calcium 8.7 - 10.2 mg/dL 9.0 9.8 9.3  Total Protein 6.0 - 8.5 g/dL - 7.3 -  Total Bilirubin 0.0 - 1.2 mg/dL - 0.4 -  Alkaline Phos 39 - 117 IU/L - 89 -  AST 0 - 40 IU/L - 33 -  ALT 0 - 44 IU/L - 14 -   CBC Latest Ref Rng & Units 08/22/2019 05/25/2019 05/06/2019  WBC 3.4 -  10.8 x10E3/uL 8.1 12.3(H) 20.6(H)  Hemoglobin 13.0 - 17.7 g/dL 15.1 13.5 16.4  Hematocrit 37.5 - 51.0 % 46.2 42.8 51.2  Platelets 150 - 450 x10E3/uL 310 276 346   Lipid Panel     Component Value Date/Time   CHOL 78 (L) 01/09/2020 1118   TRIG 112 01/09/2020 1118   HDL 28 (L) 01/09/2020 1118   CHOLHDL 2.8 01/09/2020 1118   CHOLHDL 3.5 05/05/2019 0312   VLDL 28 05/05/2019 0312   LDLCALC 29 01/09/2020 1118   HEMOGLOBIN A1C Lab Results  Component  Value Date   HGBA1C 5.4 05/05/2019   MPG 108.28 05/05/2019   TSH No results for input(s): TSH in the last 8760 hours.   12/24/2020: BNP 306 Vitamin D 39 (normal)  External labs:  Cholesterol, total 78.000 01/09/2020 HDL 28.000 01/09/2020 LDL 56.000 05/05/2019 Triglycerides 112.000 01/09/2020  A1C 5.400 05/05/2019  Hemoglobin 15.300 G/ 08/22/2019  Creatinine, Serum 1.560 01/09/2020 Potassium 4.600 01/09/2020 ALT (SGPT) 14.000 01/09/2020   Allergies   Allergies  Allergen Reactions   Bee Venom Anaphylaxis   Iodinated Contrast Media Anaphylaxis    It is noted that pt is allergic to Iodinated contrast media-iv dye,oral contrast   Chantix [Varenicline Tartrate]     Seizures     Medications Prior to Visit:   Outpatient Medications Prior to Visit  Medication Sig Dispense Refill   acetaminophen (TYLENOL) 325 MG tablet Take 650 mg by mouth every 6 (six) hours as needed for moderate pain or headache.     albuterol (PROVENTIL HFA;VENTOLIN HFA) 108 (90 BASE) MCG/ACT inhaler Inhale 1 puff into the lungs every 4 (four) hours as needed for wheezing.      aspirin 81 MG chewable tablet Chew 81 mg by mouth daily.     atorvastatin (LIPITOR) 80 MG tablet TAKE ONE TABLET BY MOUTH EVERY DAY AT 6PM 90 tablet 2   BREZTRI AEROSPHERE 160-9-4.8 MCG/ACT AERO      CORLANOR 5 MG TABS tablet TAKE 1 TABLET BY MOUTH TWICE(2) DAILY WITH MEALS 180 tablet 1   Cyanocobalamin 1500 MCG TBDP Take 1,500 mcg by mouth daily.     diclofenac Sodium (VOLTAREN) 1 % GEL  Apply 1 application topically 2 (two) times daily.     divalproex (DEPAKOTE) 250 MG DR tablet Take 250-500 mg by mouth See admin instructions. Tale 250 mg in the morning and 500 mg in the evening     docusate sodium (COLACE) 100 MG capsule Take 100 mg by mouth 2 (two) times daily.     ENTRESTO 49-51 MG TAKE ONE TABLET BY MOUTH TWICE DAILY 180 tablet 3   EPINEPHrine 0.3 mg/0.3 mL IJ SOAJ injection Inject into the muscle as needed.     esomeprazole (NEXIUM) 40 MG capsule Take 40 mg by mouth daily.     ferrous sulfate 325 (65 FE) MG tablet Take 1 tablet (325 mg total) by mouth daily with breakfast. 30 tablet 0   furosemide (LASIX) 20 MG tablet Take 1 tablet (20 mg total) by mouth daily as needed. Take 20 mg once daily as needed for shortness of breath, leg swelling, or >3lb weight gain. 30 tablet 3   gabapentin (NEURONTIN) 600 MG tablet Take 600 mg by mouth 3 (three) times daily.     hydrOXYzine (ATARAX/VISTARIL) 25 MG tablet Take 1 tablet (25 mg total) by mouth every 8 (eight) hours as needed for anxiety. 8 tablet 0   ibuprofen (ADVIL) 200 MG tablet Take 800 mg by mouth every 8 (eight) hours as needed for headache or moderate pain.     ipratropium-albuterol (DUONEB) 0.5-2.5 (3) MG/3ML SOLN Inhale 3 mLs into the lungs every 6 (six) hours as needed (shortness of breath).      metoprolol succinate (TOPROL-XL) 100 MG 24 hr tablet TAKE 1 TABLET BY MOUTH TWICE (2) DAILY WITH OR IMMEDIATELY FOLLOWING A MEAL 180 tablet 1   montelukast (SINGULAIR) 10 MG tablet Take 10 mg by mouth daily.     Multiple Vitamins-Minerals (MULTIVITAMIN WITH MINERALS) tablet Take 1 tablet by mouth daily. 30 tablet 0  nicotine (NICODERM CQ - DOSED IN MG/24 HOURS) 14 mg/24hr patch Place 1 patch (14 mg total) onto the skin daily. 28 patch 0   nitroGLYCERIN (NITROSTAT) 0.4 MG SL tablet DISSOLVE 1 TABLET UNDER THE TONGUE EVERY 5 MINUTES AS NEEDED FOR CHEST PAIN. (MAXIMUM OF 3 DOSES) 25 tablet 3   OXYGEN Inhale 2.5 L into the lungs.      oxymetazoline (AFRIN) 0.05 % nasal spray Place 1 spray into both nostrils 2 (two) times daily as needed for congestion.     pyridOXINE (VITAMIN B-6) 100 MG tablet Take 100 mg by mouth daily.     sertraline (ZOLOFT) 100 MG tablet Take 100 mg by mouth daily.     SYMBICORT 160-4.5 MCG/ACT inhaler Inhale 2 puffs into the lungs 2 (two) times daily.     tadalafil (CIALIS) 20 MG tablet Take 20 mg by mouth daily as needed for erectile dysfunction.     No facility-administered medications prior to visit.   Final Medications at End of Visit    No outpatient medications have been marked as taking for the 11/25/21 encounter (Appointment) with Rayetta Pigg, Imanni Burdine C, PA-C.   Radiology:  No results found.  Cardiac Studies:   Coronary Angiography 05/05/2019: Prox LAD to Mid LAD lesion is 100% stenosed S/P aspiration thrombectomy followed by overlapping 3.5 x 20 and a 3.0 x 12 mm Synergy DES distally, stenosis reduced from 10 percent to 0%.  D1 has ostial 20 to 30% stenosis. Circumflex: Mild disease in the proximal and, OM 2 is very tiny and severely diseased. RCA is dominant, proximal 50% stenosis. LVEDP markedly elevated at 27 mmHg.  EF 10 to 15% with anterolateral akinesis.  110 mL contrast utilized, 6.9 minutes of fluoroscopy time.  Recommendation: Patient will be kept in the intensive care unit, he may need a hospital admission for 48 hours or more in view of severe LV dysfunction unless he does well clinically.  I discussed the findings with his wife on the telephone.  Echocardiogram 07/20/2019: Left ventricle cavity is normal in size. Mild concentric hypertrophy of the left ventricle. Moderate global hypokinesis with distal antero-apical, apical dyskinesis c/w prior myocardial infarction. Severely depressed LV systolic function with visual EF 25-30%. Indeterminate diastolic filling pattern.  No significant valvular abnormality. Normal right atrial pressure. No significant change compared to previous  study on 05/05/2019.   ICD implantation 09/10/2019:  CONCLUSIONS:   1. Ischemic cardiomyopathy with chronic New York Heart Association class II heart failure.   2. Successful ICD implantation.   3. No early apparent complications.   PCV ECHOCARDIOGRAM COMPLETE 04/10/2020 Poor visualization of endocardial borders. Left ventricle cavity is normal in size. Moderate concentric hypertrophy of the left ventricle. Severe global hypokinesis. LVEF 25-30%. Indeterminate diastolic filling pattern. IVC is normal with blunted respiratory response. Estimated RA pressure 8 mmHg. No significant change compared to previous study on 05/05/2019.  ICD   Remote single-chamber pacemaker transmission 10/21/2021: VP 1%.  Longevity 8 years and 8 months.  Lead impedance and thresholds normal.  No high ventricular rate episodes.  Daily activity remained stable and "review does not suggest volume overload state.  Thoracic impedance is back to baseline after being elevated during last week of December 2022 through first week of January 2023  EKG  08/12/2021: Sinus rhythm at a rate of 96 bpm.  Normal axis.  Left atrial enlargement.  Cannot exclude anteroseptal infarct old.  Compared to EKG 08/29/2020, no significant change.  EKG 08/29/2020: Sinus rhythm at a rate of 70  bpm, left atrial enlargement, normal axis.  PRWP, cannot exclude anteroseptal infarct old.  Low voltage complexes.  Nonspecific T wave abnormality. No significant change compared to 11/5/20211.   EKG 08/01/2020: Sinus rhythm at a rate of 93 bpm with first degree AV block, left atrial enlargement.  Normal axis.  Poor R wave progression, cannot exclude anterior septal infarct old.  Low voltage complexes.  Compared to EKG 01/18/2020, no significant change.  Assessment   No diagnosis found.   No orders of the defined types were placed in this encounter.   There are no discontinued medications.   Recommendations:   RALSTON VENUS Sr.  is a 56 y.o. Caucasian  male with lung cancer in remission, ongoing tobacco use disorder, COPD, hypertension, CAD SP LAD stenting when he presented with acute pulmonary edema on 05/05/2019, severe ischemic cardiomyopathy with ejection fraction 25 to 30% SP ICD implantation on 09/10/2019.  Patient presents for 24-monthfollow-up.  Last office visit patient was clinically doing well and therefore was not a candidate for Barostim at that time, no changes were made. ***  ***  Patient presents for 6 follow-up.  Last office visit BNP was elevated, therefore patient was advised to increase Lasix dosing for 1 week.  Also last visit patient completed dual antiplatelet therapy for approximately 18 months, therefore discontinued Brilinta and continued aspirin alone.  Patient is feeling much improved since last office visit.  He has not needed Lasix in the last several months.  He is tolerating guideline directed medical therapy without issue.  He is not on spironolactone given soft blood pressures.  Patient does have chronic dyspnea on exertion, which is stable.  He is able to do all of his daily activities and work around his house without issue.  Discussed with Dr. GEinar Gipif [facility] however he is not presently NYHA class II or III, and in fact is doing well.  There is no clinical evidence of heart failure at today's office visit.  Reviewed and discussed with patient recent ICD transmission, normal ICD function and no evidence of volume overload.  Not make changes to his medications at this time. Again counseled patient regarding diet compliance as well as smoking cessation.  Follow up in 3 months, sooner if needed, for ischemic cardiomyopathy and CAD.   CAlethia Berthold PA-C 11/23/2021, 3:06 PM Office: [phone]

## 2021-11-24 DIAGNOSIS — K5090 Crohn's disease, unspecified, without complications: Secondary | ICD-10-CM | POA: Diagnosis not present

## 2021-11-24 DIAGNOSIS — I110 Hypertensive heart disease with heart failure: Secondary | ICD-10-CM | POA: Diagnosis not present

## 2021-11-24 DIAGNOSIS — N1831 Chronic kidney disease, stage 3a: Secondary | ICD-10-CM | POA: Diagnosis not present

## 2021-11-24 DIAGNOSIS — J449 Chronic obstructive pulmonary disease, unspecified: Secondary | ICD-10-CM | POA: Diagnosis not present

## 2021-11-25 ENCOUNTER — Ambulatory Visit: Payer: Medicare Other | Admitting: Student

## 2021-11-25 DIAGNOSIS — I2510 Atherosclerotic heart disease of native coronary artery without angina pectoris: Secondary | ICD-10-CM

## 2021-11-25 DIAGNOSIS — I255 Ischemic cardiomyopathy: Secondary | ICD-10-CM

## 2021-11-25 DIAGNOSIS — J449 Chronic obstructive pulmonary disease, unspecified: Secondary | ICD-10-CM | POA: Diagnosis not present

## 2021-11-25 DIAGNOSIS — I129 Hypertensive chronic kidney disease with stage 1 through stage 4 chronic kidney disease, or unspecified chronic kidney disease: Secondary | ICD-10-CM | POA: Diagnosis not present

## 2021-11-25 DIAGNOSIS — Z9181 History of falling: Secondary | ICD-10-CM | POA: Diagnosis not present

## 2021-11-27 DIAGNOSIS — Z13220 Encounter for screening for lipoid disorders: Secondary | ICD-10-CM | POA: Diagnosis not present

## 2021-11-27 DIAGNOSIS — I110 Hypertensive heart disease with heart failure: Secondary | ICD-10-CM | POA: Diagnosis not present

## 2021-12-01 DIAGNOSIS — J449 Chronic obstructive pulmonary disease, unspecified: Secondary | ICD-10-CM | POA: Diagnosis not present

## 2021-12-17 DIAGNOSIS — N1831 Chronic kidney disease, stage 3a: Secondary | ICD-10-CM | POA: Diagnosis not present

## 2021-12-17 DIAGNOSIS — R569 Unspecified convulsions: Secondary | ICD-10-CM | POA: Diagnosis not present

## 2021-12-17 DIAGNOSIS — Z6822 Body mass index (BMI) 22.0-22.9, adult: Secondary | ICD-10-CM | POA: Diagnosis not present

## 2021-12-22 ENCOUNTER — Ambulatory Visit: Payer: Medicare Other | Admitting: Student

## 2021-12-24 ENCOUNTER — Ambulatory Visit: Payer: Medicare Other | Admitting: Student

## 2021-12-24 NOTE — Progress Notes (Deleted)
? ?Primary Physician/Referring:  Maryella Shivers, MD ? ?Patient ID: Jimmy Alcala., male    DOB: 1966-02-15, 56 y.o.   MRN: [id number] ? ?No chief complaint on file. ? ?HPI:   ? ?Jimmy MANZO Sr.  is a 56 y.o. Caucasian male with lung cancer in remission, ongoing tobacco use disorder, COPD on continuous home O2 at 2.5L/min, hypertension, CAD SP LAD stenting when he presented with acute pulmonary edema on 05/05/2019, severe ischemic cardiomyopathy with ejection fraction 25 to 30% SP ICD implantation on 09/10/2019.  According to patient right kidney failure secondary to complications of ureter stenting in 2000.   ? ?Patient presents for 56-monthfollow-up.  Last office visit patient was clinically doing well and therefore was not a candidate for Verastem at that time, no changes were made. *** ? ?*** ? ?Patient presents for 56 follow-up.  Last office visit BNP was elevated, therefore patient was advised to increase Lasix dosing for 1 week.  Also last visit patient completed dual antiplatelet therapy for approximately 18 months, therefore discontinued Brilinta and continued aspirin alone.  Patient states overall he is feeling quite well since last office visit.  He continues to have mild dyspnea on exertion with extended periods of walking, however he is able to do work around his house and his daily activities without issue.  He has not needed Lasix since last office visit. ? ?Denies chest pain, palpitations, syncope, near syncope.  Denies orthopnea, PND, leg edema. He continues to have chronic stable dyspnea on exertion due to underlying COPD.   ? ?Patient continues to eat high sodium intake diet, and consume high amounts of sugar, particularly [facility] his [facility].  ? ?Past Medical History:  ?Diagnosis Date  ? Amnesia   ? Anemia   ? Chronic systolic heart failure ([place] 04/14/[place]  ? COPD (chronic obstructive pulmonary disease) ([place]   ? Crohn's disease ([place] Deteriorating  disk  ? Depression   ? Encounter for assessment of implantable cardioverter-defibrillator (ICD) 04/14/2020  ? History of MI (myocardial infarction) 05/05/2019  ? History of stroke   ? 2  ? Hyperlipidemia   ? Hypertension   ? ICD - Single chamber Abbott Vascular GALLANT VR ICD 09/10/2019 09/10/2019  ? Lung cancer ([place]   ? Mental disorder   ? Sleep apnea   ? Stroke ([facility]   ? ?Past Surgical History:  ?Procedure Laterality Date  ? APPENDECTOMY    ? CORONARY/GRAFT ACUTE MI REVASCULARIZATION N/A 05/05/2019  ? Procedure: Coronary/Graft Acute MI Revascularization;  Surgeon: GAdrian Prows MD;  [place];  Service: Cardiovascular;  Laterality: N/A;  ? EYE SURGERY    ? ICD IMPLANT N/A 09/10/2019  ? Procedure: ICD IMPLANT;  Surgeon: TEvans Lance MD;  [facility];  Service: Cardiovascular;  Laterality: N/A;  ? intestestine for chrohns disease    ? LEFT HEART CATH AND CORONARY ANGIOGRAPHY N/A 05/05/2019  ? Procedure: LEFT HEART CATH AND CORONARY ANGIOGRAPHY;  Surgeon: GAdrian Prows MD;  [facility];  Service: Cardiovascular;  Laterality: N/A;  ? PORT-A-CATH REMOVAL    ? ?Family History  ?Problem Relation Age of Onset  ? Diabetes Brother   ? Drug abuse Daughter   ?     heroin addict  ? ?No family history of premature CAD or sudden cardiac death.  ?Social History  ? ?Tobacco Use  ? Smoking status: Every Day  ?  Types: Cigarettes  ?  Smokeless tobacco: Current  ?  Types: Snuff  ? Tobacco comments:  ?  3 cigarettes daily   ?Substance Use Topics  ? Alcohol use: Not Currently  ? ?Marital Status: Legally Separated ? ?ROS  ?Review of Systems  ?Constitutional: Negative for malaise/fatigue and weight gain.  ?Cardiovascular:  Positive for dyspnea on exertion (Chronic, stable). Negative for chest pain, claudication, leg swelling, near-syncope, orthopnea, palpitations, paroxysmal nocturnal dyspnea and syncope.  ?Objective  ?There were no vitals taken for this visit.  ? ?  08/12/2021  ?  1:17 PM  12/24/2020  ? 12:56 PM 08/29/2020  ?  8:56 AM  ?Vitals with BMI  ?Height 5' 11"  5' 11"  5' 11"   ?Weight 155 lbs 3 oz 155 lbs 153 lbs  ?BMI 21.66 21.63 21.35  ?Systolic 967 591 638  ?Diastolic 74 70 65  ?Pulse 94 74 69  ?  ? Physical Exam ?Vitals reviewed.  ?Constitutional:   ?   Appearance: He is cachectic.  ?   Comments: Appears older than stated age   ?Neck:  ?   Thyroid: No thyromegaly.  ?   Vascular: No JVD.  ?Cardiovascular:  ?   Rate and Rhythm: Normal rate and regular rhythm.  ?   Pulses: Intact distal pulses.  ?   Heart sounds: S1 normal and S2 normal. Heart sounds are distant. No murmur heard. ?  No gallop.  ?Pulmonary:  ?   Effort: Pulmonary effort is normal. No tachypnea or respiratory distress.  ?   Breath sounds: Wheezing present. No decreased breath sounds, rhonchi or rales.  ?Musculoskeletal:  ?   Right lower leg: No edema.  ?   Left lower leg: No edema.  ?Neurological:  ?   General: No focal deficit present.  ?   Mental Status: He is alert and oriented to person, place, and time.  ? ?Laboratory examination:  ? ?No results for input(s): NA, K, CL, CO2, GLUCOSE, BUN, CREATININE, CALCIUM, GFRNONAA, GFRAA in the last 8760 hours. ? ?CrCl cannot be calculated (Patient's most recent lab result is older than the maximum 21 days allowed.).  ? ?  Latest Ref Rng & Units 10/15/2020  ?  2:13 PM 01/09/2020  ? 11:18 AM 08/22/2019  ? 12:07 PM  ?CMP  ?Glucose 65 - 99 mg/dL 149   87   64    ?BUN 6 - 24 mg/dL 14   13   13     ?Creatinine 0.76 - 1.27 mg/dL 1.52   1.56   1.45    ?Sodium 134 - 144 mmol/L 141   143   144    ?Potassium 3.5 - 5.2 mmol/L 3.6   4.6   3.7    ?Chloride 96 - 106 mmol/L 104   104   109    ?CO2 20 - 29 mmol/L 23   19   21     ?Calcium 8.7 - 10.2 mg/dL 9.0   9.8   9.3    ?Total Protein 6.0 - 8.5 g/dL  7.3     ?Total Bilirubin 0.0 - 1.2 mg/dL  0.4     ?Alkaline Phos 39 - 117 IU/L  89     ?AST 0 - 40 IU/L  33     ?ALT 0 - 44 IU/L  14     ? ? ?  Latest Ref Rng & Units 08/22/2019  ? 12:07 PM 05/25/2019  ? 10:49  AM 05/06/2019  ?  2:46 AM  ?CBC  ?WBC 3.4 - 10.8 x10E3/uL  8.1   12.3   20.6    ?Hemoglobin 13.0 - 17.7 g/dL 15.1   13.5   16.4    ?Hematocrit 37.5 - 51.0 % 46.2   42.8   51.2    ?Platelets 150 - 450 x10E3/uL 310   276   346    ? ?Lipid Panel  ?   ?Component Value Date/Time  ? CHOL 78 (L) 01/09/2020 1118  ? TRIG 112 01/09/2020 1118  ? HDL 28 (L) 01/09/2020 1118  ? CHOLHDL 2.8 01/09/2020 1118  ? CHOLHDL 3.5 05/05/2019 0312  ? VLDL 28 05/05/2019 0312  ? [place] 29 01/09/2020 1118  ? ?HEMOGLOBIN A1C ?Lab Results  ?Component Value Date  ? HGBA1C 5.4 05/05/2019  ? MPG 108.28 05/05/2019  ? ?TSH ?No results for input(s): TSH in the last 8760 hours.  ? ?12/24/2020: ?BNP 306 ?Vitamin D 39 (normal) ? ?External labs: ? ?Cholesterol, total 78.000 01/09/2020 ?HDL 28.000 01/09/2020 ?LDL 56.000 05/05/2019 ?Triglycerides 112.000 01/09/2020 ? ?A1C 5.400 05/05/2019 ? ?Hemoglobin 15.300 G/ 08/22/2019 ? ?Creatinine, Serum 1.560 01/09/2020 ?Potassium 4.600 01/09/2020 ?ALT (SGPT) 14.000 01/09/2020  ? ?Allergies  ? ?Allergies  ?Allergen Reactions  ? Bee Venom Anaphylaxis  ? Iodinated Contrast Media Anaphylaxis  ?  It is noted that pt is allergic to Iodinated contrast media-iv dye,oral contrast  ? Chantix [Varenicline Tartrate]   ?  Seizures   ?  ?Medications Prior to Visit:  ? ?Outpatient Medications Prior to Visit  ?Medication Sig Dispense Refill  ? acetaminophen (TYLENOL) 325 MG tablet Take 650 mg by mouth every 6 (six) hours as needed for moderate pain or headache.    ? albuterol (PROVENTIL HFA;VENTOLIN HFA) 108 (90 BASE) MCG/ACT inhaler Inhale 1 puff into the lungs every 4 (four) hours as needed for wheezing.     ? aspirin 81 MG chewable tablet Chew 81 mg by mouth daily.    ? atorvastatin (LIPITOR) 80 MG tablet TAKE ONE TABLET BY MOUTH EVERY DAY AT 6PM 90 tablet 2  ? BREZTRI AEROSPHERE 160-9-4.8 MCG/ACT AERO     ? CORLANOR 5 MG TABS tablet TAKE 1 TABLET BY MOUTH TWICE(2) DAILY WITH MEALS 180 tablet 1  ? Cyanocobalamin 1500 MCG TBDP Take 1,500 mcg  by mouth daily.    ? diclofenac Sodium (VOLTAREN) 1 % GEL Apply 1 application topically 2 (two) times daily.    ? divalproex (DEPAKOTE) 250 MG DR tablet Take 250-500 mg by mouth See admin instructions. T

## 2021-12-26 DIAGNOSIS — J449 Chronic obstructive pulmonary disease, unspecified: Secondary | ICD-10-CM | POA: Diagnosis not present

## 2022-01-01 DIAGNOSIS — J449 Chronic obstructive pulmonary disease, unspecified: Secondary | ICD-10-CM | POA: Diagnosis not present

## 2022-01-21 ENCOUNTER — Encounter: Payer: Self-pay | Admitting: *Deleted

## 2022-01-21 ENCOUNTER — Encounter: Payer: Self-pay | Admitting: Cardiology

## 2022-01-21 DIAGNOSIS — F329 Major depressive disorder, single episode, unspecified: Secondary | ICD-10-CM | POA: Insufficient documentation

## 2022-01-21 DIAGNOSIS — Z85118 Personal history of other malignant neoplasm of bronchus and lung: Secondary | ICD-10-CM | POA: Insufficient documentation

## 2022-01-21 DIAGNOSIS — K219 Gastro-esophageal reflux disease without esophagitis: Secondary | ICD-10-CM | POA: Insufficient documentation

## 2022-01-21 DIAGNOSIS — N183 Chronic kidney disease, stage 3 unspecified: Secondary | ICD-10-CM | POA: Insufficient documentation

## 2022-01-21 DIAGNOSIS — K5090 Crohn's disease, unspecified, without complications: Secondary | ICD-10-CM | POA: Insufficient documentation

## 2022-01-21 DIAGNOSIS — I110 Hypertensive heart disease with heart failure: Secondary | ICD-10-CM | POA: Insufficient documentation

## 2022-01-21 DIAGNOSIS — N289 Disorder of kidney and ureter, unspecified: Secondary | ICD-10-CM

## 2022-01-21 DIAGNOSIS — E291 Testicular hypofunction: Secondary | ICD-10-CM | POA: Insufficient documentation

## 2022-01-21 DIAGNOSIS — G629 Polyneuropathy, unspecified: Secondary | ICD-10-CM | POA: Insufficient documentation

## 2022-01-21 DIAGNOSIS — J449 Chronic obstructive pulmonary disease, unspecified: Secondary | ICD-10-CM | POA: Insufficient documentation

## 2022-01-21 DIAGNOSIS — M069 Rheumatoid arthritis, unspecified: Secondary | ICD-10-CM | POA: Insufficient documentation

## 2022-01-21 HISTORY — DX: Disorder of kidney and ureter, unspecified: N28.9

## 2022-01-25 DIAGNOSIS — J449 Chronic obstructive pulmonary disease, unspecified: Secondary | ICD-10-CM | POA: Diagnosis not present

## 2022-01-26 DIAGNOSIS — D649 Anemia, unspecified: Secondary | ICD-10-CM | POA: Insufficient documentation

## 2022-01-26 DIAGNOSIS — R569 Unspecified convulsions: Secondary | ICD-10-CM | POA: Insufficient documentation

## 2022-01-26 DIAGNOSIS — F419 Anxiety disorder, unspecified: Secondary | ICD-10-CM | POA: Insufficient documentation

## 2022-01-26 DIAGNOSIS — Z9981 Dependence on supplemental oxygen: Secondary | ICD-10-CM | POA: Insufficient documentation

## 2022-01-31 DIAGNOSIS — J449 Chronic obstructive pulmonary disease, unspecified: Secondary | ICD-10-CM | POA: Diagnosis not present

## 2022-02-02 ENCOUNTER — Encounter: Payer: Self-pay | Admitting: Student

## 2022-02-06 ENCOUNTER — Encounter: Payer: Self-pay | Admitting: Cardiology

## 2022-02-09 ENCOUNTER — Other Ambulatory Visit: Payer: Self-pay

## 2022-02-10 ENCOUNTER — Encounter: Payer: Self-pay | Admitting: Cardiology

## 2022-02-10 ENCOUNTER — Ambulatory Visit (INDEPENDENT_AMBULATORY_CARE_PROVIDER_SITE_OTHER): Payer: Medicare Other | Admitting: Cardiology

## 2022-02-10 VITALS — BP 108/72 | HR 75 | Ht 71.6 in | Wt 154.2 lb

## 2022-02-10 DIAGNOSIS — F17210 Nicotine dependence, cigarettes, uncomplicated: Secondary | ICD-10-CM

## 2022-02-10 DIAGNOSIS — F17209 Nicotine dependence, unspecified, with unspecified nicotine-induced disorders: Secondary | ICD-10-CM | POA: Diagnosis not present

## 2022-02-10 DIAGNOSIS — J431 Panlobular emphysema: Secondary | ICD-10-CM

## 2022-02-10 DIAGNOSIS — Z95810 Presence of automatic (implantable) cardiac defibrillator: Secondary | ICD-10-CM

## 2022-02-10 DIAGNOSIS — I428 Other cardiomyopathies: Secondary | ICD-10-CM | POA: Diagnosis not present

## 2022-02-10 DIAGNOSIS — I5022 Chronic systolic (congestive) heart failure: Secondary | ICD-10-CM | POA: Diagnosis not present

## 2022-02-10 NOTE — Progress Notes (Signed)
?Cardiology Office Note:   ? ?Date:  02/10/2022  ? ?ID:  [name], DOB [birth year], MRN [id number] ? ?PCP:  Maryella Shivers, MD  ?Cardiologist:  Jenean Lindau, MD  ? ?Referring MD: Lawson Radar, PA  ? ? ?ASSESSMENT:   ? ?1. Chronic systolic heart failure ([place])   ?2. Nonischemic cardiomyopathy ([place])   ?3. ICD - Single chamber Abbott Vascular GALLANT VR ICD 09/10/2019   ?4. Tobacco use disorder, continuous   ?5. Panlobular emphysema ([place])   ? ?PLAN:   ? ?In order of problems listed above: ? ?Primary prevention stressed with the patient.  Importance of compliance with diet medication stressed and vocalized understanding. ?Advanced cardiomyopathy: CHF education was given to the patient at length.  He appears to be doing well with diet and salt intake issues and regular weight checks.  He understands this at length.  I reemphasized it.  We will do a follow-up echocardiogram to assess the status of this condition. ?Cigarette smoker: I spent 5 minutes with the patient discussing solely about smoking. Smoking cessation was counseled. I suggested to the patient also different medications and pharmacological interventions. Patient is keen to try stopping on its own at this time. He will get back to me if he needs any further assistance in this matter. ?Essential hypertension: Blood pressure stable and diet was emphasized.  Lifestyle modification urged. ?Intracardiac defibrillator: Managed by electrophysiology colleagues.  Records noted. ?Patient will be seen in follow-up appointment in 6 months or earlier if the patient has any concerns ? ? ? ?Medication Adjustments/Labs and Tests Ordered: ?Current medicines are reviewed at length with the patient today.  Concerns regarding medicines are outlined above.  ?Orders Placed This Encounter  ?Procedures  ? EKG 12-Lead  ? ECHOCARDIOGRAM COMPLETE  ? ?No orders of the defined types were placed in this encounter. ? ? ? ?History of Present Illness:   ? ?Jimmy MCCAMY  Sr. is a 56 y.o. male who is being seen today for the evaluation of advanced cardiomyopathy at the request of Lawson Radar, [place].  Patient is a 56 year old male.  He has past medical history of advanced cardiomyopathy considered or a cardiac nonischemic in nature.  He has subsequently undergone intracardiac defibrillator implantation by our colleagues.  He is here as he wants to switch his cardiologist.  He tells me that he has to use a moped to go to [place] and that is very difficult for him so he wishes to reestablish with [place].  Unfortunately continues to smoke.  He is on guideline directed medical therapy.  At the time of my evaluation, the patient is alert awake oriented and in no distress. ? ?Past Medical History:  ?Diagnosis Date  ? Amnesia   ? Anemia   ? Anxiety and depression   ? Chronic systolic heart failure ([place]) 04/14/2020  ? Congestive heart failure, hypertensive ([place])   ? COPD (chronic obstructive pulmonary disease) ([place])   ? Crohn's disease ([place]) Deteriorating disk  ? Crohn's disease ([place])   ? Depression   ? Encounter for assessment of implantable cardioverter-defibrillator (ICD) 04/14/2020  ? GERD (gastroesophageal reflux disease)   ? History of lung cancer   ? History of MI (myocardial infarction) 05/05/2019  ? History of stroke   ? 2  ? Hyperlipidemia   ? Hypertension   ? Hypogonadism in male   ? ICD - Single chamber Abbott Vascular GALLANT VR ICD 09/10/2019 09/10/2019  ? Kidney disease 01/21/2022  ? chronic  ? Lung  cancer [facility])   ? Major depressive disorder   ? Mental disorder   ? Neuropathy   ? Oxygen dependent   ? Rheumatoid arthritis of left wrist (HCC)   ? Seizure ([place])   ? No recent seizures; continue Depakote  ? Sleep apnea   ? Stage 3 chronic kidney disease ([place])   ? Stroke [facility])   ? ? ?Past Surgical History:  ?Procedure Laterality Date  ? A-V CARDIAC PACEMAKER INSERTION  09/2018  ? With a defibrillator  ? ABDOMINAL SURGERY  2009  ? Done at [facility]    ? APPENDECTOMY   2013  ? Done at [facility]  ? CORONARY/GRAFT ACUTE MI REVASCULARIZATION N/A 05/05/2019  ? Procedure: Coronary/Graft Acute MI Revascularization;  Surgeon: Adrian Prows, MD;  Location: [place] CV [facility];  Service: Cardiovascular;  Laterality: N/A;  ? EYE SURGERY    ? ICD IMPLANT N/A 09/10/2019  ? Procedure: ICD IMPLANT;  Surgeon: Evans Lance, MD;  Location: [place] CV [facility];  Service: Cardiovascular;  Laterality: N/A;  ? intestestine for chrohns disease    ? LEFT HEART CATH AND CORONARY ANGIOGRAPHY N/A 05/05/2019  ? Procedure: LEFT HEART CATH AND CORONARY ANGIOGRAPHY;  Surgeon: Adrian Prows, MD;  Location: [place] [facility];  Service: Cardiovascular;  Laterality: N/A;  ? PORT-A-CATH REMOVAL    ? ? ?Current Medications: ?Current Meds  ?Medication Sig  ? acetaminophen (TYLENOL) 325 MG tablet Take 650 mg by mouth every 6 (six) hours as needed for moderate pain or headache.  ? Albuterol Sulfate (PROAIR RESPICLICK) 007 (90 Base) MCG/ACT AEPB Inhale 2 puffs into the lungs every 6 (six) hours as needed (shortness of breath and wheezing).  ? aspirin EC 81 MG tablet Take 81 mg by mouth daily. Swallow whole.  ? atorvastatin (LIPITOR) 80 MG tablet TAKE ONE TABLET BY MOUTH EVERY DAY AT 6PM  ? Budeson-Glycopyrrol-Formoterol (BREZTRI AEROSPHERE) 160-9-4.8 MCG/ACT AERO Inhale 2 puffs into the lungs 2 (two) times daily.  ? Coenzyme Q10 200 MG capsule Take 200 mg by mouth daily.  ? CORLANOR 5 MG TABS tablet TAKE 1 TABLET BY MOUTH TWICE(2) DAILY WITH MEALS  ? Cyanocobalamin (B-12) 2500 MCG TABS Take 2,500 mcg by mouth daily.  ? divalproex (DEPAKOTE) 250 MG DR tablet Take 250 mg by mouth in the morning. And 500 mg at night  ? docusate sodium (COLACE) 100 MG capsule Take 100 mg by mouth 2 (two) times daily.  ? ENTRESTO 49-51 MG TAKE ONE TABLET BY MOUTH TWICE DAILY  ? EPINEPHrine 0.3 mg/0.3 mL IJ SOAJ injection Inject 0.3 mg into the muscle as needed for anaphylaxis.  ? esomeprazole (NEXIUM) 40 MG capsule Take 40 mg by mouth daily.  ? ferrous  sulfate 325 (65 FE) MG tablet Take 1 tablet (325 mg total) by mouth daily with breakfast.  ? gabapentin (NEURONTIN) 600 MG tablet Take 600 mg by mouth 3 (three) times daily.  ? ibuprofen (ADVIL) 200 MG tablet Take 800 mg by mouth every 8 (eight) hours as needed for headache or moderate pain.  ? ipratropium-albuterol (DUONEB) 0.5-2.5 (3) MG/3ML SOLN Inhale 3 mLs into the lungs every 6 (six) hours as needed (shortness of breath).   ? metoprolol succinate (TOPROL-XL) 100 MG 24 hr tablet TAKE 1 TABLET BY MOUTH TWICE (2) DAILY WITH OR IMMEDIATELY FOLLOWING A MEAL  ? mirtazapine (REMERON) 7.5 MG tablet Take 7.5 mg by mouth at bedtime.  ? montelukast (SINGULAIR) 10 MG tablet Take 10 mg by mouth daily.  ?  Multiple Vitamins-Minerals (MULTIVITAMIN WITH MINERALS) tablet Take 1 tablet by mouth daily.  ? nicotine (NICODERM CQ - DOSED IN MG/24 HOURS) 21 mg/24hr patch Place 21 mg onto the skin daily.  ? nitroGLYCERIN (NITROSTAT) 0.4 MG SL tablet DISSOLVE 1 TABLET UNDER THE TONGUE EVERY 5 MINUTES AS NEEDED FOR CHEST PAIN. (MAXIMUM OF 3 DOSES)  ? pyridOXINE (VITAMIN B-6) 100 MG tablet Take 100 mg by mouth daily.  ? sertraline (ZOLOFT) 100 MG tablet Take 100 mg by mouth daily.  ?  ? ?Allergies:   Bee venom, Iodinated contrast media, Chantix [varenicline tartrate], and Iodinated contrast media  ? ?Social History  ? ?Socioeconomic History  ? Marital status: Single  ?  Spouse name: Not on file  ? Number of children: 5  ? Years of education: Not on file  ? Highest education level: Not on file  ?Occupational History  ? Not on file  ?Tobacco Use  ? Smoking status: Every Day  ?  Types: Cigarettes  ? Smokeless tobacco: Current  ?  Types: Snuff  ? Tobacco comments:  ?  3 cigarettes daily   ?Vaping Use  ? Vaping Use: Never used  ?Substance and Sexual Activity  ? Alcohol use: Not on file  ?  Comment: Occasional  ? Drug use: Yes  ?  Types: Cocaine, Marijuana  ?  Comment: Former Cocaine user  ? Sexual activity: Yes  ?Other Topics Concern  ? Not  on file  ?Social History Narrative  ? ** Merged History Encounter **  ?    ? ?Social Determinants of Health  ? ?Financial Resource Strain: Not on file  ?Food Insecurity: Not on file  ?Transportation Needs

## 2022-02-10 NOTE — Patient Instructions (Signed)
Medication Instructions:  ?Your physician recommends that you continue on your current medications as directed. Please refer to the Current Medication list given to you today. ? ?*If you need a refill on your cardiac medications before your next appointment, please call your pharmacy* ? ? ?Lab Work: ?None ordered ?If you have labs (blood work) drawn today and your tests are completely normal, you will receive your results only by: ?MyChart Message (if you have MyChart) OR ?A paper copy in the mail ?If you have any lab test that is abnormal or we need to change your treatment, we will call you to review the results. ? ? ?Testing/Procedures: ?Your physician has requested that you have an echocardiogram. Echocardiography is a painless test that uses sound waves to create images of your heart. It provides your doctor with information about the size and shape of your heart and how well your heart?s chambers and valves are working. This procedure takes approximately one hour. There are no restrictions for this procedure. ? ? ? ?Follow-Up: ?At [facility], you and your health needs are our priority.  As part of our continuing mission to provide you with exceptional heart care, we have created designated Provider Care Teams.  These Care Teams include your primary Cardiologist (physician) and Advanced Practice Providers (APPs -  Physician Assistants and Nurse Practitioners) who all work together to provide you with the care you need, when you need it. ? ?We recommend signing up for the patient portal called "MyChart".  Sign up information is provided on this After Visit Summary.  MyChart is used to connect with patients for Virtual Visits (Telemedicine).  Patients are able to view lab/test results, encounter notes, upcoming appointments, etc.  Non-urgent messages can be sent to your provider as well.   ?To learn more about what you can do with MyChart, go to [email or web address].   ? ?Your next appointment:   ?9  month(s) ? ?The format for your next appointment:   ?In Person ? ?Provider:   ?Jyl Heinz, MD ? ? ?Other Instructions ?Echocardiogram ?An echocardiogram is a test that uses sound waves (ultrasound) to produce images of the heart. ?Images from an echocardiogram can provide important information about: ?Heart size and shape. ?The size and thickness and movement of your heart's walls. ?Heart muscle function and strength. ?Heart valve function or if you have stenosis. Stenosis is when the heart valves are too narrow. ?If blood is flowing backward through the heart valves (regurgitation). ?A tumor or infectious growth around the heart valves. ?Areas of heart muscle that are not working well because of poor blood flow or injury from a heart attack. ?Aneurysm detection. An aneurysm is a weak or damaged part of an artery wall. The wall bulges out from the normal force of blood pumping through the body. ?Tell a health care provider about: ?Any allergies you have. ?All medicines you are taking, including vitamins, herbs, eye drops, creams, and over-the-counter medicines. ?Any blood disorders you have. ?Any surgeries you have had. ?Any medical conditions you have. ?Whether you are pregnant or may be pregnant. ?What are the risks? ?Generally, this is a safe test. However, problems may occur, including an allergic reaction to dye (contrast) that may be used during the test. ?What happens before the test? ?No specific preparation is needed. You may eat and drink normally. ?What happens during the test? ?You will take off your clothes from the waist up and put on a hospital gown. ?Electrodes or electrocardiogram (ECG)patches may be placed on  your chest. The electrodes or patches are then connected to a device that monitors your heart rate and rhythm. ?You will lie down on a table for an ultrasound exam. A gel will be applied to your chest to help sound waves pass through your skin. ?A handheld device, called a transducer, will  be pressed against your chest and moved over your heart. The transducer produces sound waves that travel to your heart and bounce back (or "echo" back) to the transducer. These sound waves will be captured in real-time and changed into images of your heart that can be viewed on a video monitor. The images will be recorded on a computer and reviewed by your health care provider. ?You may be asked to change positions or hold your breath for a short time. This makes it easier to get different views or better views of your heart. ?In some cases, you may receive contrast through an IV in one of your veins. This can improve the quality of the pictures from your heart. ?The procedure may vary among health care providers and hospitals.   ?What can I expect after the test? ?You may return to your normal, everyday life, including diet, activities, and medicines, unless your health care provider tells you not to do that. ?Follow these instructions at home: ?It is up to you to get the results of your test. Ask your health care provider, or the department that is doing the test, when your results will be ready. ?Keep all follow-up visits. This is important. ?Summary ?An echocardiogram is a test that uses sound waves (ultrasound) to produce images of the heart. ?Images from an echocardiogram can provide important information about the size and shape of your heart, heart muscle function, heart valve function, and other possible heart problems. ?You do not need to do anything to prepare before this test. You may eat and drink normally. ?After the echocardiogram is completed, you may return to your normal, everyday life, unless your health care provider tells you not to do that. ?This information is not intended to replace advice given to you by your health care provider. Make sure you discuss any questions you have with your health care provider. ?Document Revised: 05/06/2020 Document Reviewed: 05/06/2020 ?Elsevier Patient  Education ? [place]. ? ? ?

## 2022-02-12 ENCOUNTER — Other Ambulatory Visit: Payer: Medicare Other

## 2022-02-19 ENCOUNTER — Other Ambulatory Visit: Payer: Medicare Other

## 2022-02-25 ENCOUNTER — Other Ambulatory Visit: Payer: Medicare Other

## 2022-02-25 DIAGNOSIS — J449 Chronic obstructive pulmonary disease, unspecified: Secondary | ICD-10-CM | POA: Diagnosis not present

## 2022-03-01 DIAGNOSIS — H5213 Myopia, bilateral: Secondary | ICD-10-CM | POA: Diagnosis not present

## 2022-03-01 DIAGNOSIS — H524 Presbyopia: Secondary | ICD-10-CM | POA: Diagnosis not present

## 2022-03-03 ENCOUNTER — Other Ambulatory Visit: Payer: Medicare Other

## 2022-03-03 DIAGNOSIS — J449 Chronic obstructive pulmonary disease, unspecified: Secondary | ICD-10-CM | POA: Diagnosis not present

## 2022-03-09 ENCOUNTER — Other Ambulatory Visit: Payer: Medicare Other

## 2022-03-15 DIAGNOSIS — A260 Cutaneous erysipeloid: Secondary | ICD-10-CM | POA: Diagnosis not present

## 2022-03-16 ENCOUNTER — Other Ambulatory Visit: Payer: Medicare Other

## 2022-03-16 DIAGNOSIS — M79676 Pain in unspecified toe(s): Secondary | ICD-10-CM | POA: Diagnosis not present

## 2022-03-16 DIAGNOSIS — M79646 Pain in unspecified finger(s): Secondary | ICD-10-CM | POA: Diagnosis not present

## 2022-03-27 DIAGNOSIS — J449 Chronic obstructive pulmonary disease, unspecified: Secondary | ICD-10-CM | POA: Diagnosis not present

## 2022-04-02 DIAGNOSIS — J449 Chronic obstructive pulmonary disease, unspecified: Secondary | ICD-10-CM | POA: Diagnosis not present

## 2022-04-05 ENCOUNTER — Other Ambulatory Visit: Payer: Self-pay

## 2022-04-05 ENCOUNTER — Telehealth: Payer: Self-pay | Admitting: Cardiology

## 2022-04-05 DIAGNOSIS — E7800 Pure hypercholesterolemia, unspecified: Secondary | ICD-10-CM

## 2022-04-05 DIAGNOSIS — I2510 Atherosclerotic heart disease of native coronary artery without angina pectoris: Secondary | ICD-10-CM

## 2022-04-05 DIAGNOSIS — I255 Ischemic cardiomyopathy: Secondary | ICD-10-CM

## 2022-04-05 DIAGNOSIS — I5042 Chronic combined systolic (congestive) and diastolic (congestive) heart failure: Secondary | ICD-10-CM

## 2022-04-05 MED ORDER — NITROGLYCERIN 0.4 MG SL SUBL: SUBLINGUAL_TABLET | SUBLINGUAL | 3 refills | Status: AC

## 2022-04-05 MED ORDER — ATORVASTATIN CALCIUM 80 MG PO TABS: ORAL_TABLET | ORAL | 3 refills | Status: DC

## 2022-04-05 MED ORDER — METOPROLOL SUCCINATE ER 100 MG PO TB24: ORAL_TABLET | ORAL | 3 refills | Status: DC

## 2022-04-05 MED ORDER — ENTRESTO 49-51 MG PO TABS: 1.0000 | ORAL_TABLET | Freq: Two times a day (BID) | ORAL | 3 refills | Status: DC

## 2022-04-05 NOTE — Telephone Encounter (Signed)
*  STAT* If patient is at the pharmacy, call can be transferred to refill team.   1. Which medications need to be refilled? (please list name of each medication and dose if known) "all of his heart medicine"  atorvastatin (LIPITOR) 80 MG tablet ENTRESTO 49-51 MG metoprolol succinate (TOPROL-XL) 100 MG 24 hr tablet nitroGLYCERIN (NITROSTAT) 0.4 MG SL tablet  2. Which pharmacy/location (including street and city if local pharmacy) is medication to be sent to? [place]  3. Do they need a 30 day or 90 day supply? [place]

## 2022-04-15 ENCOUNTER — Other Ambulatory Visit: Payer: Medicare Other

## 2022-04-21 DIAGNOSIS — I255 Ischemic cardiomyopathy: Secondary | ICD-10-CM | POA: Diagnosis not present

## 2022-04-21 DIAGNOSIS — I5022 Chronic systolic (congestive) heart failure: Secondary | ICD-10-CM | POA: Diagnosis not present

## 2022-04-21 DIAGNOSIS — I428 Other cardiomyopathies: Secondary | ICD-10-CM | POA: Diagnosis not present

## 2022-04-21 DIAGNOSIS — Z95810 Presence of automatic (implantable) cardiac defibrillator: Secondary | ICD-10-CM | POA: Diagnosis not present

## 2022-04-21 DIAGNOSIS — Z4502 Encounter for adjustment and management of automatic implantable cardiac defibrillator: Secondary | ICD-10-CM | POA: Diagnosis not present

## 2022-04-27 DIAGNOSIS — J449 Chronic obstructive pulmonary disease, unspecified: Secondary | ICD-10-CM | POA: Diagnosis not present

## 2022-05-03 DIAGNOSIS — J449 Chronic obstructive pulmonary disease, unspecified: Secondary | ICD-10-CM | POA: Diagnosis not present

## 2022-05-13 ENCOUNTER — Encounter: Payer: Medicare Other | Admitting: Cardiology

## 2022-05-19 DIAGNOSIS — Z136 Encounter for screening for cardiovascular disorders: Secondary | ICD-10-CM | POA: Diagnosis not present

## 2022-05-19 DIAGNOSIS — M7022 Olecranon bursitis, left elbow: Secondary | ICD-10-CM | POA: Diagnosis not present

## 2022-05-19 DIAGNOSIS — Z0189 Encounter for other specified special examinations: Secondary | ICD-10-CM | POA: Diagnosis not present

## 2022-05-19 DIAGNOSIS — Z0000 Encounter for general adult medical examination without abnormal findings: Secondary | ICD-10-CM | POA: Diagnosis not present

## 2022-05-19 DIAGNOSIS — M25522 Pain in left elbow: Secondary | ICD-10-CM | POA: Diagnosis not present

## 2022-05-19 DIAGNOSIS — Z6821 Body mass index (BMI) 21.0-21.9, adult: Secondary | ICD-10-CM | POA: Diagnosis not present

## 2022-05-19 DIAGNOSIS — Z139 Encounter for screening, unspecified: Secondary | ICD-10-CM | POA: Diagnosis not present

## 2022-05-28 DIAGNOSIS — J449 Chronic obstructive pulmonary disease, unspecified: Secondary | ICD-10-CM | POA: Diagnosis not present

## 2022-05-28 DIAGNOSIS — N1831 Chronic kidney disease, stage 3a: Secondary | ICD-10-CM | POA: Diagnosis not present

## 2022-06-03 DIAGNOSIS — J449 Chronic obstructive pulmonary disease, unspecified: Secondary | ICD-10-CM | POA: Diagnosis not present

## 2022-06-17 DIAGNOSIS — S59902A Unspecified injury of left elbow, initial encounter: Secondary | ICD-10-CM | POA: Diagnosis not present

## 2022-06-17 DIAGNOSIS — M7022 Olecranon bursitis, left elbow: Secondary | ICD-10-CM | POA: Diagnosis not present

## 2022-06-22 DIAGNOSIS — Z6821 Body mass index (BMI) 21.0-21.9, adult: Secondary | ICD-10-CM | POA: Diagnosis not present

## 2022-06-22 DIAGNOSIS — Z23 Encounter for immunization: Secondary | ICD-10-CM | POA: Diagnosis not present

## 2022-06-27 DIAGNOSIS — J449 Chronic obstructive pulmonary disease, unspecified: Secondary | ICD-10-CM | POA: Diagnosis not present

## 2022-07-03 DIAGNOSIS — J449 Chronic obstructive pulmonary disease, unspecified: Secondary | ICD-10-CM | POA: Diagnosis not present

## 2022-07-28 DIAGNOSIS — J449 Chronic obstructive pulmonary disease, unspecified: Secondary | ICD-10-CM | POA: Diagnosis not present

## 2022-08-03 DIAGNOSIS — J449 Chronic obstructive pulmonary disease, unspecified: Secondary | ICD-10-CM | POA: Diagnosis not present

## 2022-08-25 DIAGNOSIS — M25511 Pain in right shoulder: Secondary | ICD-10-CM | POA: Diagnosis not present

## 2022-08-27 DIAGNOSIS — J449 Chronic obstructive pulmonary disease, unspecified: Secondary | ICD-10-CM | POA: Diagnosis not present

## 2022-09-02 DIAGNOSIS — J449 Chronic obstructive pulmonary disease, unspecified: Secondary | ICD-10-CM | POA: Diagnosis not present

## 2022-09-19 DIAGNOSIS — I10 Essential (primary) hypertension: Secondary | ICD-10-CM | POA: Diagnosis not present

## 2022-09-19 DIAGNOSIS — Z85118 Personal history of other malignant neoplasm of bronchus and lung: Secondary | ICD-10-CM | POA: Diagnosis not present

## 2022-09-19 DIAGNOSIS — Z8673 Personal history of transient ischemic attack (TIA), and cerebral infarction without residual deficits: Secondary | ICD-10-CM | POA: Diagnosis not present

## 2022-09-19 DIAGNOSIS — I639 Cerebral infarction, unspecified: Secondary | ICD-10-CM | POA: Diagnosis not present

## 2022-09-19 DIAGNOSIS — R0902 Hypoxemia: Secondary | ICD-10-CM | POA: Diagnosis not present

## 2022-09-19 DIAGNOSIS — I252 Old myocardial infarction: Secondary | ICD-10-CM | POA: Diagnosis not present

## 2022-09-19 DIAGNOSIS — Z20822 Contact with and (suspected) exposure to covid-19: Secondary | ICD-10-CM | POA: Diagnosis not present

## 2022-09-19 DIAGNOSIS — E876 Hypokalemia: Secondary | ICD-10-CM | POA: Diagnosis not present

## 2022-09-19 DIAGNOSIS — R531 Weakness: Secondary | ICD-10-CM | POA: Diagnosis not present

## 2022-09-19 DIAGNOSIS — R509 Fever, unspecified: Secondary | ICD-10-CM | POA: Diagnosis not present

## 2022-09-19 DIAGNOSIS — Z79899 Other long term (current) drug therapy: Secondary | ICD-10-CM | POA: Diagnosis not present

## 2022-09-19 DIAGNOSIS — Z950 Presence of cardiac pacemaker: Secondary | ICD-10-CM | POA: Diagnosis not present

## 2022-09-19 DIAGNOSIS — I249 Acute ischemic heart disease, unspecified: Secondary | ICD-10-CM | POA: Diagnosis not present

## 2022-09-19 DIAGNOSIS — R110 Nausea: Secondary | ICD-10-CM | POA: Diagnosis not present

## 2022-09-19 DIAGNOSIS — R000 Tachycardia, unspecified: Secondary | ICD-10-CM | POA: Diagnosis not present

## 2022-09-19 DIAGNOSIS — J449 Chronic obstructive pulmonary disease, unspecified: Secondary | ICD-10-CM | POA: Diagnosis not present

## 2022-09-19 DIAGNOSIS — Z743 Need for continuous supervision: Secondary | ICD-10-CM | POA: Diagnosis not present

## 2022-09-19 DIAGNOSIS — R9431 Abnormal electrocardiogram [ECG] [EKG]: Secondary | ICD-10-CM | POA: Diagnosis not present

## 2022-09-19 DIAGNOSIS — Z9981 Dependence on supplemental oxygen: Secondary | ICD-10-CM | POA: Diagnosis not present

## 2022-09-20 DIAGNOSIS — R531 Weakness: Secondary | ICD-10-CM | POA: Diagnosis not present

## 2022-09-20 DIAGNOSIS — E876 Hypokalemia: Secondary | ICD-10-CM | POA: Diagnosis not present

## 2022-09-23 DIAGNOSIS — M25522 Pain in left elbow: Secondary | ICD-10-CM | POA: Diagnosis not present

## 2022-09-23 DIAGNOSIS — Z7689 Persons encountering health services in other specified circumstances: Secondary | ICD-10-CM | POA: Diagnosis not present

## 2022-09-23 DIAGNOSIS — Z6821 Body mass index (BMI) 21.0-21.9, adult: Secondary | ICD-10-CM | POA: Diagnosis not present

## 2022-09-27 DIAGNOSIS — J449 Chronic obstructive pulmonary disease, unspecified: Secondary | ICD-10-CM | POA: Diagnosis not present

## 2022-10-18 ENCOUNTER — Other Ambulatory Visit: Payer: Self-pay | Admitting: Cardiology

## 2022-10-18 DIAGNOSIS — R700 Elevated erythrocyte sedimentation rate: Secondary | ICD-10-CM | POA: Diagnosis not present

## 2022-10-18 DIAGNOSIS — I255 Ischemic cardiomyopathy: Secondary | ICD-10-CM

## 2022-10-18 DIAGNOSIS — I5042 Chronic combined systolic (congestive) and diastolic (congestive) heart failure: Secondary | ICD-10-CM

## 2022-10-18 DIAGNOSIS — Z79899 Other long term (current) drug therapy: Secondary | ICD-10-CM | POA: Diagnosis not present

## 2022-10-18 DIAGNOSIS — I2510 Atherosclerotic heart disease of native coronary artery without angina pectoris: Secondary | ICD-10-CM

## 2022-10-18 DIAGNOSIS — I110 Hypertensive heart disease with heart failure: Secondary | ICD-10-CM | POA: Diagnosis not present

## 2022-10-18 DIAGNOSIS — M25522 Pain in left elbow: Secondary | ICD-10-CM | POA: Diagnosis not present

## 2022-10-18 DIAGNOSIS — F17200 Nicotine dependence, unspecified, uncomplicated: Secondary | ICD-10-CM | POA: Diagnosis not present

## 2022-10-18 DIAGNOSIS — Z6823 Body mass index (BMI) 23.0-23.9, adult: Secondary | ICD-10-CM | POA: Diagnosis not present

## 2022-10-18 DIAGNOSIS — E7800 Pure hypercholesterolemia, unspecified: Secondary | ICD-10-CM

## 2022-10-18 NOTE — Telephone Encounter (Signed)
Rx refill sent to pharmacy. 

## 2022-10-28 DIAGNOSIS — J449 Chronic obstructive pulmonary disease, unspecified: Secondary | ICD-10-CM | POA: Diagnosis not present

## 2022-11-26 DIAGNOSIS — J449 Chronic obstructive pulmonary disease, unspecified: Secondary | ICD-10-CM | POA: Diagnosis not present

## 2022-11-29 DIAGNOSIS — R700 Elevated erythrocyte sedimentation rate: Secondary | ICD-10-CM | POA: Diagnosis not present

## 2022-12-06 DIAGNOSIS — N1831 Chronic kidney disease, stage 3a: Secondary | ICD-10-CM | POA: Diagnosis not present

## 2022-12-06 DIAGNOSIS — K5090 Crohn's disease, unspecified, without complications: Secondary | ICD-10-CM | POA: Diagnosis not present

## 2022-12-06 DIAGNOSIS — Z1211 Encounter for screening for malignant neoplasm of colon: Secondary | ICD-10-CM | POA: Diagnosis not present

## 2022-12-06 DIAGNOSIS — Z6823 Body mass index (BMI) 23.0-23.9, adult: Secondary | ICD-10-CM | POA: Diagnosis not present

## 2022-12-27 DIAGNOSIS — J449 Chronic obstructive pulmonary disease, unspecified: Secondary | ICD-10-CM | POA: Diagnosis not present

## 2023-01-06 ENCOUNTER — Ambulatory Visit (INDEPENDENT_AMBULATORY_CARE_PROVIDER_SITE_OTHER): Payer: 59

## 2023-01-06 DIAGNOSIS — I428 Other cardiomyopathies: Secondary | ICD-10-CM | POA: Diagnosis not present

## 2023-01-07 LAB — CUP PACEART REMOTE DEVICE CHECK
Battery Remaining Longevity: 91 mo
Battery Remaining Percentage: 75 %
Battery Voltage: 2.98 V
Brady Statistic RV Percent Paced: 1 %
Date Time Interrogation Session: 20240411230016
HighPow Impedance: 72 Ohm
Implantable Lead Connection Status: 753985
Implantable Lead Implant Date: 20201214
Implantable Lead Location: 753860
Implantable Pulse Generator Implant Date: 20201214
Lead Channel Impedance Value: 550 Ohm
Lead Channel Pacing Threshold Amplitude: 0.5 V
Lead Channel Pacing Threshold Pulse Width: 0.5 ms
Lead Channel Sensing Intrinsic Amplitude: 12 mV
Lead Channel Setting Pacing Amplitude: 2.5 V
Lead Channel Setting Pacing Pulse Width: 0.5 ms
Lead Channel Setting Sensing Sensitivity: 0.5 mV
Pulse Gen Serial Number: 111013076
Zone Setting Status: 755011

## 2023-01-24 NOTE — Progress Notes (Signed)
 " Cardiology Office Note:    Date:  01/25/2023   ID:  Jimmy JONELLE Corona Sr., DOB 1965/10/02, MRN [id number]  PCP:  Trinidad Glisson, MD   [facility] Providers Cardiologist:  None     Referring MD: Trinidad Glisson, MD   CC: follow up of CAD  History of Present Illness:    Jimmy Cosman. is a 57 y.o. male with a hx of CAD s/p PCI/DES LAD, VF arrest, NICM, ICD 2020, COPD oxygen  dependent, GERD, CKD stage III, depression, alcohol abuse, tobacco use, Crohn's disease, seizure.  He was admitted on 05/05/2019 discharged on 05/06/2019 with an acute anterolateral wall STEMI s/p primary PCI of culprit LAD with 2 overlapping stents.  Echo at this time revealed a severely reduced ejection fraction of 25 to 30%, diffuse hypokinesis.  It was recommended that he wear a LifeVest however he ultimately left AMA.  Previously established with Dr. Ladona of [facility] cardiovascular however recently transition to establish care with Dr. Edwyna due to constraints related to riding a moped to [place].   Most recent device check on 01/07/2023 showed appropriate histograms, lead and battery stable, no recommended changes.  He presents today for follow-up of his CAD.  He states that he frequently has low fatigue, he associates this with low blood pressure readings.  He checks his blood pressure occasionally at home when he is feeling bad and notices that it is typically in the 80 systolic range.  Today in the office it is 90/70.  He denies chest pain, palpitations, dyspnea, pnd, orthopnea, n, v, dizziness, syncope, edema, weight gain, or early satiety.   Past Medical History:  Diagnosis Date   Amnesia    Anemia    Anxiety and depression    Chronic systolic heart failure (HCC) 04/14/2020   Congestive heart failure, hypertensive (HCC)    COPD (chronic obstructive pulmonary disease) (HCC)    Crohn's disease (HCC) Deteriorating disk   Crohn's disease (HCC)    Depression    Encounter for assessment  of implantable cardioverter-defibrillator (ICD) 04/14/2020   GERD (gastroesophageal reflux disease)    History of lung cancer    History of MI (myocardial infarction) 05/05/2019   History of stroke    2   Hyperlipidemia    Hypertension    Hypogonadism in male    ICD - Single chamber Abbott Vascular GALLANT VR ICD 09/10/2019 09/10/2019   Kidney disease 01/21/2022   chronic   Lung cancer (HCC)    Major depressive disorder    Mental disorder    Neuropathy    Oxygen  dependent    Rheumatoid arthritis of left wrist (HCC)    Seizure (HCC)    No recent seizures; continue Depakote    Sleep apnea    Stage 3 chronic kidney disease (HCC)    Stroke [facility])     Past Surgical History:  Procedure Laterality Date   A-V CARDIAC PACEMAKER INSERTION  09/2018   With a defibrillator   ABDOMINAL SURGERY  2009   Done at [facility]   APPENDECTOMY     APPENDECTOMY  2013   Done at [facility] ACUTE MI REVASCULARIZATION N/A 05/05/2019   Procedure: Coronary/Graft Acute MI Revascularization;  Surgeon: Ladona Heinz, MD;  Location[facility] INVASIVE CV LAB;  Service: Cardiovascular;  Laterality: N/A;   EYE SURGERY     ICD IMPLANT N/A 09/10/2019   Procedure: ICD IMPLANT;  Surgeon: Waddell Danelle ORN, MD;  Location: [facility] INVASIVE CV LAB;  Service: Cardiovascular;  Laterality: N/A;   intestestine for chrohns disease     LEFT HEART CATH AND CORONARY ANGIOGRAPHY N/A 05/05/2019   Procedure: LEFT HEART CATH AND CORONARY ANGIOGRAPHY;  Surgeon: Ladona Heinz, MD;  Location: [facility] INVASIVE CV LAB;  Service: Cardiovascular;  Laterality: N/A;   PORT-A-CATH REMOVAL      Current Medications: Current Meds  Medication Sig   acetaminophen  (TYLENOL ) 325 MG tablet Take 650 mg by mouth every 6 (six) hours as needed for moderate pain or headache.   Albuterol  Sulfate (PROAIR  RESPICLICK) 108 (90 Base) MCG/ACT AEPB Inhale 2 puffs into the lungs every 6 (six) hours as needed (shortness of breath and wheezing).   aspirin  EC 81 MG tablet  Take 81 mg by mouth daily. Swallow whole.   atorvastatin  (LIPITOR ) 80 MG tablet Take 1 tablet (80 mg total) by mouth daily. TAKE ONE TABLET BY MOUTH EVERY DAY AT 6PM   Budeson-Glycopyrrol-Formoterol  (BREZTRI  AEROSPHERE) 160-9-4.8 MCG/ACT AERO Inhale 2 puffs into the lungs 2 (two) times daily.   Coenzyme Q10 200 MG capsule Take 200 mg by mouth daily.   CORLANOR  5 MG TABS tablet TAKE 1 TABLET BY MOUTH TWICE(2) DAILY WITH MEALS   Cyanocobalamin (B-12) 2500 MCG TABS Take 2,500 mcg by mouth daily.   divalproex  (DEPAKOTE ) 250 MG DR tablet Take 250 mg by mouth in the morning. And 500 mg at night   docusate sodium (COLACE) 100 MG capsule Take 100 mg by mouth 2 (two) times daily.   ENTRESTO  49-51 MG Take 1 tablet by mouth 2 (two) times daily.   EPINEPHrine 0.3 mg/0.3 mL IJ SOAJ injection Inject 0.3 mg into the muscle as needed for anaphylaxis.   esomeprazole  (NEXIUM ) 40 MG capsule Take 40 mg by mouth daily.   ezetimibe  (ZETIA ) 10 MG tablet Take 1 tablet (10 mg total) by mouth daily.   ferrous sulfate  325 (65 FE) MG tablet Take 1 tablet (325 mg total) by mouth daily with breakfast.   gabapentin  (NEURONTIN ) 600 MG tablet Take 600 mg by mouth 3 (three) times daily.   ibuprofen (ADVIL) 200 MG tablet Take 800 mg by mouth every 8 (eight) hours as needed for headache or moderate pain.   ipratropium-albuterol  (DUONEB) 0.5-2.5 (3) MG/3ML SOLN Inhale 3 mLs into the lungs every 6 (six) hours as needed (shortness of breath).    magnesium  oxide (MAG-OX) 400 MG tablet Take 2 tablets by mouth 2 (two) times daily.   metoprolol  succinate (TOPROL -XL) 50 MG 24 hr tablet Take 1 tablet (50 mg total) by mouth 2 (two) times daily. Take with or immediately following a meal.   mirtazapine  (REMERON ) 7.5 MG tablet Take 7.5 mg by mouth at bedtime.   montelukast (SINGULAIR) 10 MG tablet Take 10 mg by mouth daily.   Multiple Vitamins-Minerals (MULTIVITAMIN WITH MINERALS) tablet Take 1 tablet by mouth daily.   nicotine  (NICODERM CQ  -  DOSED IN MG/24 HOURS) 21 mg/24hr patch Place 21 mg onto the skin daily.   nitroGLYCERIN  (NITROSTAT ) 0.4 MG SL tablet DISSOLVE 1 TABLET UNDER THE TONGUE EVERY 5 MINUTES AS NEEDED FOR CHEST PAIN. (MAXIMUM OF 3 DOSES)   predniSONE  (DELTASONE ) 10 MG tablet Take 10 mg by mouth daily with breakfast.   pyridOXINE (VITAMIN B-6) 100 MG tablet Take 100 mg by mouth daily.   sertraline  (ZOLOFT ) 100 MG tablet Take 100 mg by mouth daily.   [DISCONTINUED] metoprolol  succinate (TOPROL -XL) 100 MG 24 hr tablet Take 1 tablet (100 mg total) by mouth 2 (two) times daily with a meal. TAKE  1 TABLET BY MOUTH TWICE (2) DAILY WITH OR IMMEDIATELY FOLLOWING A MEAL     Allergies:   Bee venom, Iodinated contrast media, Chantix [varenicline tartrate], and Iodinated contrast media   Social History   Socioeconomic History   Marital status: Significant Other    Spouse name: Not on file   Number of children: 5   Years of education: Not on file   Highest education level: Not on file  Occupational History   Not on file  Tobacco Use   Smoking status: Every Day    Types: Cigarettes   Smokeless tobacco: Current    Types: Snuff   Tobacco comments:    3 cigarettes daily   Vaping Use   Vaping Use: Never used  Substance and Sexual Activity   Alcohol use: Not on file    Comment: Occasional   Drug use: Yes    Types: Cocaine, Marijuana    Comment: Former Cocaine user   Sexual activity: Yes  Other Topics Concern   Not on file  Social History Narrative   ** Merged History Encounter **       Social Determinants of Health   Financial Resource Strain: High Risk (05/31/2019)   Overall Financial Resource Strain (CARDIA)    Difficulty of Paying Living Expenses: Hard  Food Insecurity: Food Insecurity Present (05/31/2019)   Hunger Vital Sign    Worried About Running Out of Food in the Last Year: Sometimes true    Ran Out of Food in the Last Year: Sometimes true  Transportation Needs: Unmet Transportation Needs (05/31/2019)    PRAPARE - Administrator, Civil Service (Medical): No    Lack of Transportation (Non-Medical): Yes  Physical Activity: Not on file  Stress: Not on file  Social Connections: Not on file     Family History: The patient's family history includes Diabetes in his brother; Drug abuse in his daughter.  ROS:   Please see the history of present illness.     All other systems reviewed and are negative.  EKGs/Labs/Other Studies Reviewed:    The following studies were reviewed today:  Cardiac Studies & Procedures   CARDIAC CATHETERIZATION  CARDIAC CATHETERIZATION 05/05/2019  Narrative Coronary Angiography 05/05/2019: Prox LAD to Mid LAD lesion is 100% stenosed S/P aspiration thrombectomy followed by overlapping 3.5 x 20 and a 3.0 x 12 mm Synergy DES distally, stenosis reduced from 10 percent to 0%.  D1 has ostial 20 to 30% stenosis. Circumflex: Mild disease in the proximal and, OM 2 is very tiny and severely diseased. RCA is dominant, proximal 50% stenosis. LV: EDP markedly elevated at 27 mmHg.  EF 10 to 15% with anterolateral akinesis.  110 mL contrast utilized, 6.9 minutes of fluoroscopy time.  Recommendation: Patient will be kept in the intensive care unit, he may need a hospital admission for 48 hours or more in view of severe LV dysfunction unless he does well clinically.  I discussed the findings with his wife on the telephone.  Findings Coronary Findings Diagnostic  Dominance: Right  Left Anterior Descending Prox LAD to Mid LAD lesion is 100% stenosed. The lesion is type C and thrombotic.  Left Circumflex Vessel is large.  First Obtuse Marginal Branch Vessel is moderate in size. 1st Mrg lesion is 20% stenosed.  Second Obtuse Marginal Branch Vessel is small in size. 2nd Mrg lesion is 90% stenosed.  Right Coronary Artery Vessel is large. Prox RCA lesion is 50% stenosed.  Intervention  Prox LAD to Mid LAD  lesion Stent Lesion length:  15 mm. CATH VISTA  GUIDE 6FR XB3.5 guide catheter was inserted. Lesion crossed with guidewire using a WIRE COUGAR XT STRL 190CM. Pre-stent angioplasty was performed using a BALLOON SAPPHIRE 2.5X12. A drug-eluting stent was successfully placed using a STENT SYNERGY DES 3.5X20. Stent strut is well apposed. Stent overlaps previously placed stent. Post-stent angioplasty was performed.  3.0x12 mm Synergy overlapped distal to the previous to the proximal stent deployed due to residual stenosis of 50% and 3.0x12 mm Synergy stent balloon at 16 atm between stent struts. Thrombectomy Aspiration thrombectomy performed using a CATH EXTRAC PRONTO 5.36F 138CM. 3 passes taken. Post-Intervention Lesion Assessment The intervention was successful. Pre-interventional TIMI flow is 0. Post-intervention TIMI flow is 3. No complications occurred at this lesion. There is a 0% residual stenosis post intervention.     ECHOCARDIOGRAM  PCV ECHOCARDIOGRAM COMPLETE 04/11/2021  Narrative Echocardiogram 04/07/2021: Left ventricle cavity is normal in size. Normal left ventricular wall thickness. Severe global hypokinesis and apical anterior, apical akinesis. LV systolic function 20-25%. Doppler evidence of grade I (impaired) diastolic dysfunction, normal LAP. No significant valvular abnormality. No evidence of pulmonary hypertension. Compared to previous study on 04/10/2020, there is minimal decrease in LVEF from 25-30%.             Echocardiogram 04/07/2021:  Left ventricle cavity is normal in size. Normal left ventricular wall  thickness. Severe global hypokinesis and apical anterior, apical akinesis.  LV systolic function 20-25%. Doppler evidence of grade I (impaired)  diastolic dysfunction, normal LAP.  No significant valvular abnormality.  No evidence of pulmonary hypertension.  Compared to previous study on 04/10/2020, there is minimal decrease in LVEF  from 25-30%.   EKG:  EKG is  ordered today.  The ekg ordered today demonstrates SR,  HR 66 bpm.   Recent Labs: No results found for requested labs within last 365 days.  Recent Lipid Panel    Component Value Date/Time   CHOL 78 (L) 01/09/2020 1118   TRIG 112 01/09/2020 1118   HDL 28 (L) 01/09/2020 1118   CHOLHDL 2.8 01/09/2020 1118   CHOLHDL 3.5 05/05/2019 0312   VLDL 28 05/05/2019 0312   LDLCALC 29 01/09/2020 1118     Risk Assessment/Calculations:                Physical Exam:    VS:  BP 90/70 (BP Location: Right Arm, Patient Position: Sitting, Cuff Size: Normal)   Pulse 66   Ht 5' 11.6 (1.819 m)   Wt 150 lb (68 kg)   SpO2 97%   BMI 20.57 kg/m     Wt Readings from Last 3 Encounters:  01/25/23 150 lb (68 kg)  02/10/22 154 lb 3.2 oz (69.9 kg)  12/17/21 162 lb 4 oz (73.6 kg)     GEN: Cachectic,  in no acute distress HEENT: Normal NECK: No JVD; No carotid bruits LYMPHATICS: No lymphadenopathy CARDIAC: RRR, no murmurs, rubs, gallops RESPIRATORY:  Clear to auscultation without rales, wheezing or rhonchi  ABDOMEN: Soft, non-tender, non-distended MUSCULOSKELETAL:  No edema; No deformity  SKIN: Warm and dry NEUROLOGIC:  Alert and oriented x 3 PSYCHIATRIC:  Normal affect   ASSESSMENT:    1. Chronic systolic heart failure (HCC)   2. Nonischemic cardiomyopathy (HCC)   3. ICD - Single chamber Abbott Vascular GALLANT VR ICD 09/10/2019   4. Tobacco use disorder, continuous   5. Coronary artery disease involving native coronary artery of native heart without angina pectoris    PLAN:  In order of problems listed above:  CAD-s/p DES to LAD in 2020. Stable with no anginal symptoms. No indication for ischemic evaluation.  Continue aspirin  81 mg daily, continue Lipitor  80 mg daily, continue metoprolol --currently 100 mg twice daily will decrease to 50 mg twice daily, nitroglycerin  as needed-stress has not needed. Chronic systolic heart failure-NYHA class I, euvolemic.  Continue metoprolol  50 mg twice a day, continue Ivabradine  5 mg twice daily, continue  Entresto  49-51 mg twice daily. NICM/ICD-currently following with Dr. Waddell, previously established with Dr. Ladona.  He is moped dependent, and would like to see if he can follow-up with EP in [place].  Will reach out to Dr. Waddell to see if he can transfer to see Dr. Inocencio. Tobacco abuse-he has cut his tobacco intake down significantly from 3 packs/day to 3 cigarettes/day.  Congratulated him on this and encourage complete cessation. Hyperlipidemia- most recent LDL was well controlled at 54, continue Lipitor  at current dose.      Disposition - Decrease metoprolol  to 50 mg twice/day, return in 6 months.       Medication Adjustments/Labs and Tests Ordered: Current medicines are reviewed at length with the patient today.  Concerns regarding medicines are outlined above.  Orders Placed This Encounter  Procedures   Basic metabolic panel   CBC with Differential/Platelet   Lipid panel   Hepatic function panel   EKG 12-Lead   Meds ordered this encounter  Medications   ezetimibe  (ZETIA ) 10 MG tablet    Sig: Take 1 tablet (10 mg total) by mouth daily.    Dispense:  90 tablet    Refill:  3   metoprolol  succinate (TOPROL -XL) 50 MG 24 hr tablet    Sig: Take 1 tablet (50 mg total) by mouth 2 (two) times daily. Take with or immediately following a meal.    Dispense:  180 tablet    Refill:  3    Patient Instructions  Medication Instructions:  Your physician has recommended you make the following change in your medication:  Decrease Metoprolol  to 50 mg two times daily Start Zetia  10 mg once daily  *If you need a refill on your cardiac medications before your next appointment, please call your pharmacy*   Lab Work: Your physician recommends that you return for lab work in: Today for BMP and CBC In 8 weeks come back for a Fasting Lipid Panel and Liver Function Lab opens at 8am. You DO NOT NEED an appointment. Best time to come is between 8am and 12noon and between 1:30 and 4:30. If you  have been asked to fast for your blood work please have nothing to eat or drink after midnight. You may have water.   If you have labs (blood work) drawn today and your tests are completely normal, you will receive your results only by: MyChart Message (if you have MyChart) OR A paper copy in the mail If you have any lab test that is abnormal or we need to change your treatment, we will call you to review the results.   Testing/Procedures: NONE   Follow-Up: At [facility], you and your health needs are our priority.  As part of our continuing mission to provide you with exceptional heart care, we have created designated Provider Care Teams.  These Care Teams include your primary Cardiologist (physician) and Advanced Practice Providers (APPs -  Physician Assistants and Nurse Practitioners) who all work together to provide you with the care you need, when you need it.  We  recommend signing up for the patient portal called MyChart.  Sign up information is provided on this After Visit Summary.  MyChart is used to connect with patients for Virtual Visits (Telemedicine).  Patients are able to view lab/test results, encounter notes, upcoming appointments, etc.  Non-urgent messages can be sent to your provider as well.   To learn more about what you can do with MyChart, go to [email or web address].    Your next appointment:   6 month(s)  Provider:   Salinda Snedeker Crape, MD    Other Instructions     Signed, Jimmy JAYSON Hoover, NP  01/25/2023 4:43 PM    [facility] "

## 2023-01-25 ENCOUNTER — Encounter: Payer: Self-pay | Admitting: Cardiology

## 2023-01-25 ENCOUNTER — Ambulatory Visit: Payer: 59 | Attending: Cardiology | Admitting: Cardiology

## 2023-01-25 ENCOUNTER — Telehealth: Payer: Self-pay | Admitting: Cardiology

## 2023-01-25 VITALS — BP 90/70 | HR 66 | Ht 71.6 in | Wt 150.0 lb

## 2023-01-25 DIAGNOSIS — I5022 Chronic systolic (congestive) heart failure: Secondary | ICD-10-CM

## 2023-01-25 DIAGNOSIS — F17209 Nicotine dependence, unspecified, with unspecified nicotine-induced disorders: Secondary | ICD-10-CM

## 2023-01-25 DIAGNOSIS — Z95810 Presence of automatic (implantable) cardiac defibrillator: Secondary | ICD-10-CM

## 2023-01-25 DIAGNOSIS — I428 Other cardiomyopathies: Secondary | ICD-10-CM

## 2023-01-25 DIAGNOSIS — I2510 Atherosclerotic heart disease of native coronary artery without angina pectoris: Secondary | ICD-10-CM

## 2023-01-25 MED ORDER — EZETIMIBE 10 MG PO TABS: 10.0000 mg | ORAL_TABLET | Freq: Every day | ORAL | 3 refills | Status: DC

## 2023-01-25 MED ORDER — METOPROLOL SUCCINATE ER 50 MG PO TB24: 50.0000 mg | ORAL_TABLET | Freq: Two times a day (BID) | ORAL | 3 refills | Status: DC

## 2023-01-25 NOTE — Patient Instructions (Signed)
Medication Instructions:  Your physician has recommended you make the following change in your medication:  Decrease Metoprolol to 50 mg two times daily Start Zetia 10 mg once daily  *If you need a refill on your cardiac medications before your next appointment, please call your pharmacy*   Lab Work: Your physician recommends that you return for lab work in: Today for BMP and CBC In 8 weeks come back for a Fasting Lipid Panel and Liver Function Lab opens at 8am. You DO NOT NEED an appointment. Best time to come is between 8am and 12noon and between 1:30 and 4:30. If you have been asked to fast for your blood work please have nothing to eat or drink after midnight. You may have water.   If you have labs (blood work) drawn today and your tests are completely normal, you will receive your results only by: MyChart Message (if you have MyChart) OR A paper copy in the mail If you have any lab test that is abnormal or we need to change your treatment, we will call you to review the results.   Testing/Procedures: NONE   Follow-Up: At [facility], you and your health needs are our priority.  As part of our continuing mission to provide you with exceptional heart care, we have created designated Provider Care Teams.  These Care Teams include your primary Cardiologist (physician) and Advanced Practice Providers (APPs -  Physician Assistants and Nurse Practitioners) who all work together to provide you with the care you need, when you need it.  We recommend signing up for the patient portal called "MyChart".  Sign up information is provided on this After Visit Summary.  MyChart is used to connect with patients for Virtual Visits (Telemedicine).  Patients are able to view lab/test results, encounter notes, upcoming appointments, etc.  Non-urgent messages can be sent to your provider as well.   To learn more about what you can do with MyChart, go to [email or web address].    Your next  appointment:   6 month(s)  Provider:   Belva Crome, MD    Other Instructions

## 2023-01-26 ENCOUNTER — Telehealth: Payer: Self-pay | Admitting: *Deleted

## 2023-01-26 DIAGNOSIS — J449 Chronic obstructive pulmonary disease, unspecified: Secondary | ICD-10-CM | POA: Diagnosis not present

## 2023-01-26 LAB — CBC WITH DIFFERENTIAL/PLATELET
Basophils Absolute: 0.1 x10E3/uL (ref 0.0–0.2)
Basos: 1 %
EOS (ABSOLUTE): 0.1 x10E3/uL (ref 0.0–0.4)
Eos: 2 %
Hematocrit: 43.5 % (ref 37.5–51.0)
Hemoglobin: 13.8 g/dL (ref 13.0–17.7)
Immature Grans (Abs): 0 x10E3/uL (ref 0.0–0.1)
Immature Granulocytes: 0 %
Lymphocytes Absolute: 1.4 x10E3/uL (ref 0.7–3.1)
Lymphs: 19 %
MCH: 28.3 pg (ref 26.6–33.0)
MCHC: 31.7 g/dL (ref 31.5–35.7)
MCV: 89 fL (ref 79–97)
Monocytes Absolute: 0.7 x10E3/uL (ref 0.1–0.9)
Monocytes: 9 %
Neutrophils Absolute: 4.9 x10E3/uL (ref 1.4–7.0)
Neutrophils: 69 %
Platelets: 238 x10E3/uL (ref 150–450)
RBC: 4.88 x10E6/uL (ref 4.14–5.80)
RDW: 13.3 % (ref 11.6–15.4)
WBC: 7.1 x10E3/uL (ref 3.4–10.8)

## 2023-01-26 LAB — BASIC METABOLIC PANEL WITH GFR
BUN/Creatinine Ratio: 10 (ref 9–20)
BUN: 14 mg/dL (ref 6–24)
CO2: 21 mmol/L (ref 20–29)
Calcium: 9.4 mg/dL (ref 8.7–10.2)
Chloride: 107 mmol/L — ABNORMAL HIGH (ref 96–106)
Creatinine, Ser: 1.34 mg/dL — ABNORMAL HIGH (ref 0.76–1.27)
Glucose: 98 mg/dL (ref 70–99)
Potassium: 4.5 mmol/L (ref 3.5–5.2)
Sodium: 142 mmol/L (ref 134–144)
eGFR: 62 mL/min/1.73

## 2023-01-26 NOTE — Telephone Encounter (Signed)
-----   Message from Flossie Dibble, NP sent at 01/26/2023  9:06 AM EDT ----- Mr. Tweed, Your lab work showed stable kidney function, no signs of anemia. I did look at your previous lipid panel incorrectly. You do not need to start Zetia. I apologize for the confusion. Lets have you come back one day in the next few weeks to recheck your fasting lipid panel and liver function tests. No rush, but do need to get an updated one.  Best, Victorino Dike

## 2023-01-26 NOTE — Telephone Encounter (Signed)
Let pt know about lab work results and to come back for further labs as stated at visit. Pt verbalized understanding and had no further questions.

## 2023-01-28 ENCOUNTER — Telehealth: Payer: Self-pay | Admitting: General Practice

## 2023-01-28 NOTE — Telephone Encounter (Signed)
Called patient to let him know he needs to see Ladona Ridgel as scheduled and then can follow up with [place]/LVM Anner Crete 01/28/23

## 2023-01-28 NOTE — Telephone Encounter (Signed)
-----   Message from Flossie Dibble, NP sent at 01/27/2023  1:25 PM EDT ----- Jimmy Nelson, Dr. Ladona Ridgel is ok with him seeing Dr. Elberta Fortis here, and Dr. Elberta Fortis is ok as well. Can we call him to set up to see Dr. Elberta Fortis on one of his days here? I told him he may have to see Dr. Ladona Ridgel one more time in [place] if we did not have any openings soon with Dr. Elberta Fortis. He will just need a call probably to verify if this is possible. Thank you, Victorino Dike ----- Message ----- From: Regan Lemming, MD Sent: 01/27/2023  12:16 PM EDT To: Baird Lyons, RN; Marinus Maw, MD; #  Happy to see him ----- Message ----- From: Flossie Dibble, NP Sent: 01/25/2023   5:09 PM EDT To: Regan Lemming, MD; Marinus Maw, MD  Dr. Ladona Ridgel, Jimmy Nelson would like to see if it is possible for him to transfer to Dr. Elberta Fortis so he can see him in [place]. He is moped dependent and it is very hard for him to get to [place]. Thank you, Victorino Dike

## 2023-02-10 NOTE — Progress Notes (Signed)
Remote ICD transmission.   

## 2023-02-18 NOTE — Telephone Encounter (Signed)
Addressed via separate phone encounter.   Jimmy Sorrow, NP

## 2023-02-24 ENCOUNTER — Ambulatory Visit: Payer: 59 | Admitting: Internal Medicine

## 2023-02-26 DIAGNOSIS — J449 Chronic obstructive pulmonary disease, unspecified: Secondary | ICD-10-CM | POA: Diagnosis not present

## 2023-03-08 ENCOUNTER — Ambulatory Visit: Payer: 59 | Admitting: Internal Medicine

## 2023-03-08 ENCOUNTER — Encounter: Payer: 59 | Admitting: Internal Medicine

## 2023-03-28 ENCOUNTER — Ambulatory Visit: Payer: 59 | Admitting: Cardiology

## 2023-03-28 DIAGNOSIS — J449 Chronic obstructive pulmonary disease, unspecified: Secondary | ICD-10-CM | POA: Diagnosis not present

## 2023-04-05 ENCOUNTER — Other Ambulatory Visit: Payer: Self-pay

## 2023-04-05 DIAGNOSIS — E7800 Pure hypercholesterolemia, unspecified: Secondary | ICD-10-CM

## 2023-04-05 MED ORDER — ATORVASTATIN CALCIUM 80 MG PO TABS: 80.0000 mg | ORAL_TABLET | Freq: Every day | ORAL | 2 refills | Status: AC

## 2023-04-07 ENCOUNTER — Ambulatory Visit (INDEPENDENT_AMBULATORY_CARE_PROVIDER_SITE_OTHER): Payer: 59

## 2023-04-07 DIAGNOSIS — I255 Ischemic cardiomyopathy: Secondary | ICD-10-CM

## 2023-04-07 DIAGNOSIS — Z13220 Encounter for screening for lipoid disorders: Secondary | ICD-10-CM | POA: Diagnosis not present

## 2023-04-07 DIAGNOSIS — N1831 Chronic kidney disease, stage 3a: Secondary | ICD-10-CM | POA: Diagnosis not present

## 2023-04-07 LAB — CUP PACEART REMOTE DEVICE CHECK
Battery Remaining Longevity: 90 mo
Battery Remaining Percentage: 74 %
Battery Voltage: 2.98 V
Brady Statistic RV Percent Paced: 1 %
Date Time Interrogation Session: 20240711020036
HighPow Impedance: 71 Ohm
Implantable Lead Connection Status: 753985
Implantable Lead Implant Date: 20201214
Implantable Lead Location: 753860
Implantable Pulse Generator Implant Date: 20201214
Lead Channel Impedance Value: 590 Ohm
Lead Channel Pacing Threshold Amplitude: 0.5 V
Lead Channel Pacing Threshold Pulse Width: 0.5 ms
Lead Channel Sensing Intrinsic Amplitude: 12 mV
Lead Channel Setting Pacing Amplitude: 2.5 V
Lead Channel Setting Pacing Pulse Width: 0.5 ms
Lead Channel Setting Sensing Sensitivity: 0.5 mV
Pulse Gen Serial Number: 111013076
Zone Setting Status: 755011

## 2023-04-08 ENCOUNTER — Other Ambulatory Visit: Payer: Self-pay | Admitting: Cardiology

## 2023-04-08 DIAGNOSIS — I5042 Chronic combined systolic (congestive) and diastolic (congestive) heart failure: Secondary | ICD-10-CM

## 2023-04-11 DIAGNOSIS — J449 Chronic obstructive pulmonary disease, unspecified: Secondary | ICD-10-CM | POA: Diagnosis not present

## 2023-04-11 DIAGNOSIS — N1831 Chronic kidney disease, stage 3a: Secondary | ICD-10-CM | POA: Diagnosis not present

## 2023-04-11 DIAGNOSIS — R569 Unspecified convulsions: Secondary | ICD-10-CM | POA: Diagnosis not present

## 2023-04-11 DIAGNOSIS — I110 Hypertensive heart disease with heart failure: Secondary | ICD-10-CM | POA: Diagnosis not present

## 2023-04-11 DIAGNOSIS — Z6823 Body mass index (BMI) 23.0-23.9, adult: Secondary | ICD-10-CM | POA: Diagnosis not present

## 2023-04-13 ENCOUNTER — Ambulatory Visit: Payer: 59 | Admitting: Cardiology

## 2023-04-26 NOTE — Progress Notes (Signed)
Remote ICD transmission.   

## 2023-04-28 DIAGNOSIS — J449 Chronic obstructive pulmonary disease, unspecified: Secondary | ICD-10-CM | POA: Diagnosis not present

## 2023-05-03 ENCOUNTER — Telehealth: Payer: Self-pay | Admitting: Cardiology

## 2023-05-03 DIAGNOSIS — I5042 Chronic combined systolic (congestive) and diastolic (congestive) heart failure: Secondary | ICD-10-CM

## 2023-05-03 DIAGNOSIS — E7800 Pure hypercholesterolemia, unspecified: Secondary | ICD-10-CM

## 2023-05-03 MED ORDER — IVABRADINE HCL 5 MG PO TABS: 5.0000 mg | ORAL_TABLET | Freq: Two times a day (BID) | ORAL | 3 refills | Status: DC

## 2023-05-03 MED ORDER — METOPROLOL SUCCINATE ER 50 MG PO TB24: 50.0000 mg | ORAL_TABLET | Freq: Two times a day (BID) | ORAL | 3 refills | Status: DC

## 2023-05-03 NOTE — Telephone Encounter (Signed)
*  STAT* If patient is at the pharmacy, call can be transferred to refill team.   1. Which medications need to be refilled? (please list name of each medication and dose if known)  ENTRESTO 49-51 MG  CORLANOR 5 MG TABS tablet  metoprolol succinate (TOPROL-XL) 50 MG 24 hr tablet (Expired)  atorvastatin (LIPITOR) 80 MG tablet   2. Which pharmacy/location (including street and city if local pharmacy) is medication to be sent to? [facility] - [place], [place] - [address]    3. Do they need a 30 day or 90 day supply? 90 day

## 2023-05-12 DIAGNOSIS — J449 Chronic obstructive pulmonary disease, unspecified: Secondary | ICD-10-CM | POA: Diagnosis not present

## 2023-05-23 NOTE — Progress Notes (Deleted)
Electrophysiology Office Note:   Date:  05/23/2023  ID:  Jimmy Govern Sr., DOB [birth year], MRN [id number]  Primary Cardiologist: Garwin Brothers, MD Electrophysiologist: Lewayne Bunting, MD > to Dr. Elberta Fortis  {Click to update primary MD,subspecialty MD or APP then REFRESH:1}    History of Present Illness:   Jimmy Nelson. is a 57 y.o. male with h/o HTN, HLD, ICM, s/p MI, CHF, tobacco abuse with panlobular emphysema, oxygen dependent respiratory failure, lung cancer s/p lobectomy, OSA, CKD III, CVA seen for routine electrophysiology followup.   Last remote device check 04/07/23 with normal remote, battery and leads stable.   Since last being seen in our clinic the patient reports doing ***.  he denies chest pain, palpitations, dyspnea, PND, orthopnea, nausea, vomiting, dizziness, syncope, edema, weight gain, or early satiety.   Review of systems complete and found to be negative unless listed in HPI.    EP Information / Studies Reviewed:    {EKGtoday:28818}      ICD Interrogation-  reviewed in detail today,  See PACEART report.  Device History: Abbott Single Chamber ICD implanted 09/10/2019 for ICM History of appropriate therapy: No History of AAD therapy: No   Risk Assessment/Calculations:     No BP recorded.  {Refresh Note OR Click here to enter BP  :1}***        Physical Exam:   VS:  There were no vitals taken for this visit.   Wt Readings from Last 3 Encounters:  01/25/23 150 lb (68 kg)  02/10/22 154 lb 3.2 oz (69.9 kg)  12/17/21 162 lb 4 oz (73.6 kg)     GEN: Well nourished, well developed in no acute distress NECK: No JVD; No carotid bruits CARDIAC: {EPRHYTHM:28826}, no murmurs, rubs, gallops RESPIRATORY:  Clear to auscultation without rales, wheezing or rhonchi  ABDOMEN: Soft, non-tender, non-distended EXTREMITIES:  No edema; No deformity   ASSESSMENT AND PLAN:    Chronic Systolic Dysfunction s/p Abbott single chamber ICD  -euvolemic today -stable on  an appropriate medical regimen -normal ICD function -See Pace Art report -No programming changes today  ICM -no anginal symptoms  Disposition:   Follow up with Dr. Elberta Fortis in [place] (pt requested to transfer closer to home) {EPFOLLOW NW:29562}   Signed, Canary Brim, MSN, APRN, NP-C, AGACNP-BC [facility] - Electrophysiology  05/23/2023, 1:46 PM

## 2023-05-24 ENCOUNTER — Ambulatory Visit: Payer: Medicare HMO | Attending: Cardiology | Admitting: Physician Assistant

## 2023-05-24 DIAGNOSIS — Z95810 Presence of automatic (implantable) cardiac defibrillator: Secondary | ICD-10-CM

## 2023-05-24 DIAGNOSIS — I255 Ischemic cardiomyopathy: Secondary | ICD-10-CM

## 2023-05-24 DIAGNOSIS — I5022 Chronic systolic (congestive) heart failure: Secondary | ICD-10-CM

## 2023-05-29 DIAGNOSIS — J449 Chronic obstructive pulmonary disease, unspecified: Secondary | ICD-10-CM | POA: Diagnosis not present

## 2023-05-29 DIAGNOSIS — I110 Hypertensive heart disease with heart failure: Secondary | ICD-10-CM | POA: Diagnosis not present

## 2023-05-29 DIAGNOSIS — K219 Gastro-esophageal reflux disease without esophagitis: Secondary | ICD-10-CM | POA: Diagnosis not present

## 2023-06-10 DIAGNOSIS — M5410 Radiculopathy, site unspecified: Secondary | ICD-10-CM | POA: Diagnosis not present

## 2023-06-12 DIAGNOSIS — J449 Chronic obstructive pulmonary disease, unspecified: Secondary | ICD-10-CM | POA: Diagnosis not present

## 2023-06-16 DIAGNOSIS — F322 Major depressive disorder, single episode, severe without psychotic features: Secondary | ICD-10-CM | POA: Diagnosis not present

## 2023-06-28 DIAGNOSIS — K219 Gastro-esophageal reflux disease without esophagitis: Secondary | ICD-10-CM | POA: Diagnosis not present

## 2023-06-28 DIAGNOSIS — J449 Chronic obstructive pulmonary disease, unspecified: Secondary | ICD-10-CM | POA: Diagnosis not present

## 2023-06-28 DIAGNOSIS — I110 Hypertensive heart disease with heart failure: Secondary | ICD-10-CM | POA: Diagnosis not present

## 2023-07-07 ENCOUNTER — Ambulatory Visit (INDEPENDENT_AMBULATORY_CARE_PROVIDER_SITE_OTHER): Payer: Medicare HMO

## 2023-07-07 DIAGNOSIS — I428 Other cardiomyopathies: Secondary | ICD-10-CM | POA: Diagnosis not present

## 2023-07-07 LAB — CUP PACEART REMOTE DEVICE CHECK
Battery Remaining Longevity: 88 mo
Battery Remaining Percentage: 72 %
Battery Voltage: 2.98 V
Brady Statistic RV Percent Paced: 1 %
Date Time Interrogation Session: 20241010020053
HighPow Impedance: 70 Ohm
Implantable Lead Connection Status: 753985
Implantable Lead Implant Date: 20201214
Implantable Lead Location: 753860
Implantable Pulse Generator Implant Date: 20201214
Lead Channel Impedance Value: 630 Ohm
Lead Channel Pacing Threshold Amplitude: 0.5 V
Lead Channel Pacing Threshold Pulse Width: 0.5 ms
Lead Channel Sensing Intrinsic Amplitude: 12 mV
Lead Channel Setting Pacing Amplitude: 2.5 V
Lead Channel Setting Pacing Pulse Width: 0.5 ms
Lead Channel Setting Sensing Sensitivity: 0.5 mV
Pulse Gen Serial Number: 111013076
Zone Setting Status: 755011

## 2023-07-08 ENCOUNTER — Ambulatory Visit: Payer: Medicare HMO | Admitting: Physician Assistant

## 2023-07-08 NOTE — Progress Notes (Deleted)
Cardiology Office Note:  .   Date:  07/08/2023  ID:  Jimmy Govern Sr., DOB 11-17-1965, MRN [id number] PCP: Charlott Rakes, MD  [facility] Providers Cardiologist:  Garwin Brothers, MD Electrophysiologist:  Lewayne Bunting, MD {  History of Present Illness: .   Jimmy Nelson. is a 57 y.o. male w/PMHx of ***  Following with Dr. Royetta Car   Today's visit is scheduled as ***  ROS: ***   Device information ***  Arrhythmia/AAD hx ***  Studies Reviewed: Marland Kitchen    EKG done today and reviewed by myself:  ***  DEVICE interrogation done today and reviewed by myself *** Battery and lead measurements are good ***   ***   Risk Assessment/Calculations:    Physical Exam:   VS:  There were no vitals taken for this visit.   Wt Readings from Last 3 Encounters:  01/25/23 150 lb (68 kg)  02/10/22 154 lb 3.2 oz (69.9 kg)  12/17/21 162 lb 4 oz (73.6 kg)    GEN: Well nourished, well developed in no acute distress NECK: No JVD; No carotid bruits CARDIAC: ***RRR, no murmurs, rubs, gallops RESPIRATORY:  *** CTA b/l without rales, wheezing or rhonchi  ABDOMEN: Soft, non-tender, non-distended EXTREMITIES:  No edema; No deformity   PPM/ICD/ILR site: *** is stable, no thinning, fluctuation, tethering  ASSESSMENT AND PLAN: .    *** AFib *** Secondary hypercoagulable state 2/2 AFib     {Are you ordering a CV Procedure (e.g. stress test, cath, DCCV, TEE, etc)?   Press F2        :[id number]}     Dispo: ***  Signed, Sheilah Pigeon, PA-C

## 2023-07-12 DIAGNOSIS — J449 Chronic obstructive pulmonary disease, unspecified: Secondary | ICD-10-CM | POA: Diagnosis not present

## 2023-07-19 NOTE — Progress Notes (Signed)
Remote ICD transmission.   

## 2023-07-29 DIAGNOSIS — I110 Hypertensive heart disease with heart failure: Secondary | ICD-10-CM | POA: Diagnosis not present

## 2023-07-29 DIAGNOSIS — K219 Gastro-esophageal reflux disease without esophagitis: Secondary | ICD-10-CM | POA: Diagnosis not present

## 2023-07-29 DIAGNOSIS — J449 Chronic obstructive pulmonary disease, unspecified: Secondary | ICD-10-CM | POA: Diagnosis not present

## 2023-08-12 DIAGNOSIS — J449 Chronic obstructive pulmonary disease, unspecified: Secondary | ICD-10-CM | POA: Diagnosis not present

## 2023-08-21 NOTE — Progress Notes (Deleted)
Cardiology Office Note:    Date:  08/21/2023   ID:  Jimmy Govern Sr., DOB 1966-02-18, MRN [id number]  PCP:  Charlott Rakes, MD   [facility] Providers Cardiologist:  Garwin Brothers, MD Electrophysiologist:  Lewayne Bunting, MD     Referring MD: Charlott Rakes, MD   CC: follow up of CAD  History of Present Illness:    Jimmy Nelson. is a 57 y.o. male with a hx of CAD s/p PCI/DES LAD 2020, VF arrest, NICM, ICD 2020, COPD oxygen dependent, GERD, CKD stage III, depression, alcohol abuse, tobacco use, Crohn's disease, seizure.  07/07/2023 device check device function 04/07/2021 echo EF 20 to 25%, grade 1 DD, severe global hypokinesis and apical anterior, apical akinesis 09/10/2019 ICD implantation 05/05/2019 heart cath acute anterolateral wall STEMI s/p primary PCI of culprit LAD with 2 overlapping stents.  Previously established with Dr. Jacinto Halim of [facility] cardiovascular however recently transitioned to establish care with Dr. Tomie China due to constraints related to riding a moped to [place].   Most recently evaluated on 01/25/2023, for follow-up of his CAD.  Most bothered by low fatigue, low blood pressure readings, we decrease his metoprolol to 50 mg twice daily.  Past Medical History:  Diagnosis Date   Amnesia    Anemia    Anxiety and depression    Chronic systolic heart failure (HCC) 04/14/2020   Congestive heart failure, hypertensive (HCC)    COPD (chronic obstructive pulmonary disease) (HCC)    Crohn's disease (HCC) Deteriorating disk   Crohn's disease (HCC)    Depression    Encounter for assessment of implantable cardioverter-defibrillator (ICD) 04/14/2020   GERD (gastroesophageal reflux disease)    History of lung cancer    History of MI (myocardial infarction) 05/05/2019   History of stroke    2   Hyperlipidemia    Hypertension    Hypogonadism in male    ICD - Single chamber Abbott Vascular G[employer]ANT VR ICD 09/10/2019 09/10/2019   Kidney  disease 01/21/2022   chronic   Lung cancer (HCC)    Major depressive disorder    Mental disorder    Neuropathy    Oxygen dependent    Rheumatoid arthritis of left wrist (HCC)    Seizure (HCC)    No recent seizures; continue Depakote   Sleep apnea    Stage 3 chronic kidney disease (HCC)    Stroke [facility])     Past Surgical History:  Procedure Laterality Date   A-V CARDIAC PACEMAKER INSERTION  09/2018   With a defibrillator   ABDOMINAL SURGERY  2009   Done at [facility]   APPENDECTOMY     APPENDECTOMY  2013   Done at [facility] ACUTE MI REVASCULARIZATION N/A 05/05/2019   Procedure: Coronary/Graft Acute MI Revascularization;  Surgeon: Yates Decamp, MD;  Location: [facility] INVASIVE CV LAB;  Service: Cardiovascular;  Laterality: N/A;   EYE SURGERY     ICD IMPLANT N/A 09/10/2019   Procedure: ICD IMPLANT;  Surgeon: Marinus Maw, MD;  Location: [facility] INVASIVE CV LAB;  Service: Cardiovascular;  Laterality: N/A;   intestestine for chrohns disease     LEFT HEART CATH AND CORONARY ANGIOGRAPHY N/A 05/05/2019   Procedure: LEFT HEART CATH AND CORONARY ANGIOGRAPHY;  Surgeon: Yates Decamp, MD;  Location: [facility] INVASIVE CV LAB;  Service: Cardiovascular;  Laterality: N/A;   PORT-A-CATH REMOVAL      Current Medications: No outpatient medications have been marked as taking for the 08/22/23 encounter (Appointment) with  Flossie Dibble, NP.     Allergies:   Bee venom, Iodinated contrast media, Chantix [varenicline tartrate], and Iodinated contrast media   Social History   Socioeconomic History   Marital status: Significant Other    Spouse name: Not on file   Number of children: 5   Years of education: Not on file   Highest education level: Not on file  Occupational History   Not on file  Tobacco Use   Smoking status: Every Day    Types: Cigarettes   Smokeless tobacco: Current    Types: Snuff   Tobacco comments:    3 cigarettes daily   Vaping Use   Vaping status: Never Used  Substance  and Sexual Activity   Alcohol use: Not on file    Comment: Occasional   Drug use: Yes    Types: Cocaine, Marijuana    Comment: Former Cocaine user   Sexual activity: Yes  Other Topics Concern   Not on file  Social History Narrative   ** Merged History Encounter **       Social Determinants of Health   Financial Resource Strain: High Risk (05/31/2019)   Overall Financial Resource Strain (CARDIA)    Difficulty of Paying Living Expenses: Hard  Food Insecurity: Food Insecurity Present (05/31/2019)   Hunger Vital Sign    Worried About Running Out of Food in the Last Year: Sometimes true    Ran Out of Food in the Last Year: Sometimes true  Transportation Needs: Unmet Transportation Needs (05/31/2019)   PRAPARE - Administrator, Civil Service (Medical): No    Lack of Transportation (Non-Medical): Yes  Physical Activity: Not on file  Stress: Not on file  Social Connections: Not on file     Family History: The patient's family history includes Diabetes in his brother; Drug abuse in his daughter.  ROS:   Please see the history of present illness.     All other systems reviewed and are negative.  EKGs/Labs/Other Studies Reviewed:    The following studies were reviewed today:  Cardiac Studies & Procedures   CARDIAC CATHETERIZATION  CARDIAC CATHETERIZATION 05/05/2019  Narrative Coronary Angiography 05/05/2019: Prox LAD to Mid LAD lesion is 100% stenosed S/P aspiration thrombectomy followed by overlapping 3.5 x 20 and a 3.0 x 12 mm Synergy DES distally, stenosis reduced from 10 percent to 0%.  D1 has ostial 20 to 30% stenosis. Circumflex: Mild disease in the proximal and, OM 2 is very tiny and severely diseased. RCA is dominant, proximal 50% stenosis. LV: EDP markedly elevated at 27 mmHg.  EF 10 to 15% with anterolateral akinesis.  110 mL contrast utilized, 6.9 minutes of fluoroscopy time.  Recommendation: Patient will be kept in the intensive care unit, he may need a  hospital admission for 48 hours or more in view of severe LV dysfunction unless he does well clinically.  I discussed the findings with his wife on the telephone.  Findings Coronary Findings Diagnostic  Dominance: Right  Left Anterior Descending Prox LAD to Mid LAD lesion is 100% stenosed. The lesion is type C and thrombotic.  Left Circumflex Vessel is large.  First Obtuse Marginal Branch Vessel is moderate in size. 1st Mrg lesion is 20% stenosed.  Second Obtuse Marginal Branch Vessel is small in size. 2nd Mrg lesion is 90% stenosed.  Right Coronary Artery Vessel is large. Prox RCA lesion is 50% stenosed.  Intervention  Prox LAD to Mid LAD lesion Stent Lesion length:  15 mm. CATH  VISTA GUIDE 6FR XB3.5 guide catheter was inserted. Lesion crossed with guidewire using a WIRE COUGAR XT STRL 190CM. Pre-stent angioplasty was performed using a BALLOON SAPPHIRE 2.5X12. A drug-eluting stent was successfully placed using a STENT SYNERGY DES 3.5X20. Stent strut is well apposed. Stent overlaps previously placed stent. Post-stent angioplasty was performed.  3.0x12 mm Synergy overlapped distal to the previous to the proximal stent deployed due to residual stenosis of 50% and 3.0x12 mm Synergy stent balloon at 16 atm between stent struts. Thrombectomy Aspiration thrombectomy performed using a CATH EXTRAC PRONTO 5.55F 138CM. 3 passes taken. Post-Intervention Lesion Assessment The intervention was successful. Pre-interventional TIMI flow is 0. Post-intervention TIMI flow is 3. No complications occurred at this lesion. There is a 0% residual stenosis post intervention.     ECHOCARDIOGRAM  PCV ECHOCARDIOGRAM COMPLETE 04/07/2021  Narrative Echocardiogram 04/07/2021: Left ventricle cavity is normal in size. Normal left ventricular wall thickness. Severe global hypokinesis and apical anterior, apical akinesis. LV systolic function 20-25%. Doppler evidence of grade I (impaired) diastolic dysfunction,  normal LAP. No significant valvular abnormality. No evidence of pulmonary hypertension. Compared to previous study on 04/10/2020, there is minimal decrease in LVEF from 25-30%.             Echocardiogram 04/07/2021:  Left ventricle cavity is normal in size. Normal left ventricular wall  thickness. Severe global hypokinesis and apical anterior, apical akinesis.  LV systolic function 20-25%. Doppler evidence of grade I (impaired)  diastolic dysfunction, normal LAP.  No significant valvular abnormality.  No evidence of pulmonary hypertension.  Compared to previous study on 04/10/2020, there is minimal decrease in LVEF  from 25-30%.   EKG:  EKG is  ordered today.  The ekg ordered today demonstrates SR, HR 66 bpm.   Recent Labs: 01/25/2023: BUN 14; Creatinine, Ser 1.34; Hemoglobin 13.8; Platelets 238; Potassium 4.5; Sodium 142  Recent Lipid Panel    Component Value Date/Time   CHOL 78 (L) 01/09/2020 1118   TRIG 112 01/09/2020 1118   HDL 28 (L) 01/09/2020 1118   CHOLHDL 2.8 01/09/2020 1118   CHOLHDL 3.5 05/05/2019 0312   VLDL 28 05/05/2019 0312   LDLCALC 29 01/09/2020 1118     Risk Assessment/Calculations:      No BP recorded.  {Refresh Note OR Click here to enter BP  :1}***         Physical Exam:    VS:  There were no vitals taken for this visit.    Wt Readings from Last 3 Encounters:  01/25/23 150 lb (68 kg)  02/10/22 154 lb 3.2 oz (69.9 kg)  12/17/21 162 lb 4 oz (73.6 kg)     GEN: Cachectic,  in no acute distress HEENT: Normal NECK: No JVD; No carotid bruits LYMPHATICS: No lymphadenopathy CARDIAC: RRR, no murmurs, rubs, gallops RESPIRATORY:  Clear to auscultation without rales, wheezing or rhonchi  ABDOMEN: Soft, non-tender, non-distended MUSCULOSKELETAL:  No edema; No deformity  SKIN: Warm and dry NEUROLOGIC:  Alert and oriented x 3 PSYCHIATRIC:  Normal affect   ASSESSMENT:    No diagnosis found.  PLAN:    In order of problems listed above:  CAD-s/p DES  to LAD in 2020. Stable with no anginal symptoms. No indication for ischemic evaluation.  Continue aspirin 81 mg daily, continue Lipitor 80 mg daily, continue metoprolol 50 mg twice daily Chronic systolic heart failure-NYHA class I, euvolemic.  Continue metoprolol 50 mg twice a day, continue Ivabradine 5 mg twice daily, continue Entresto 49-51 mg twice daily. NICM/ICD-currently  following with Dr. Ladona Ridgel, previously established with Dr. Jacinto Halim.  He is moped dependent, and would like to see if he can follow-up with EP in [place].  Will reach out to Dr. Ladona Ridgel to see if he can transfer to see Dr. Elberta Fortis. Tobacco abuse-he has cut his tobacco intake down significantly from 3 packs/day to 3 cigarettes/day.  Congratulated him on this and encourage complete cessation. Hyperlipidemia- most recent LDL was well controlled at 54, continue Lipitor at current dose.      Disposition - Decrease metoprolol to 50 mg twice/day, return in 6 months.       Medication Adjustments/Labs and Tests Ordered: Current medicines are reviewed at length with the patient today.  Concerns regarding medicines are outlined above.  No orders of the defined types were placed in this encounter.  No orders of the defined types were placed in this encounter.   There are no Patient Instructions on file for this visit.   Signed, Flossie Dibble, NP  08/21/2023 1:41 PM    [facility]

## 2023-08-22 ENCOUNTER — Ambulatory Visit: Payer: Medicare HMO | Admitting: Cardiology

## 2023-08-22 DIAGNOSIS — E785 Hyperlipidemia, unspecified: Secondary | ICD-10-CM

## 2023-08-22 DIAGNOSIS — Z95810 Presence of automatic (implantable) cardiac defibrillator: Secondary | ICD-10-CM

## 2023-08-22 DIAGNOSIS — I2510 Atherosclerotic heart disease of native coronary artery without angina pectoris: Secondary | ICD-10-CM

## 2023-08-22 DIAGNOSIS — I5020 Unspecified systolic (congestive) heart failure: Secondary | ICD-10-CM

## 2023-08-22 DIAGNOSIS — J449 Chronic obstructive pulmonary disease, unspecified: Secondary | ICD-10-CM

## 2023-08-22 DIAGNOSIS — E782 Mixed hyperlipidemia: Secondary | ICD-10-CM

## 2023-08-28 DIAGNOSIS — J449 Chronic obstructive pulmonary disease, unspecified: Secondary | ICD-10-CM | POA: Diagnosis not present

## 2023-09-03 NOTE — Progress Notes (Deleted)
 Cardiology Office Note:    Date:  09/03/2023   ID:  Jimmy Govern Sr., DOB 12/07/1965, MRN [id number]  PCP:  Jimmy Rakes, MD   [facility] Providers Cardiologist:  Jimmy Brothers, MD Electrophysiologist:  Jimmy Bunting, MD     Referring MD: Jimmy Rakes, MD   CC: follow up of CAD  History of Present Illness:    Jimmy Maday. is a 57 y.o. male with a hx of CAD s/p PCI/DES LAD 2020, VF arrest, NICM, ICD 2020, COPD oxygen dependent, GERD, CKD stage III, depression, alcohol abuse, tobacco use, Crohn's disease, seizure.  07/07/2023 device check revealed normal functioning 04/07/2021 echo EF 20 to 25%, grade 1 DD, severe global hypokinesis and apical anterior, apical akinesis 09/10/2019 ICD implantation 05/05/2019 heart cath acute anterolateral wall STEMI s/p primary PCI of culprit LAD with 2 overlapping stents.  [facility] however recently transitioned to establish care with Dr. Tomie Nelson due to constraints related to riding a moped to [place].   Most recently evaluated on 01/25/2023, for follow-up of his CAD.  Most bothered by low fatigue, low blood pressure readings, we decrease his metoprolol to 50 mg twice daily.  Past Medical History:  Diagnosis Date   Amnesia    Anemia    Anxiety and depression    Chronic systolic heart failure (HCC) 04/14/2020   Congestive heart failure, hypertensive (HCC)    COPD (chronic obstructive pulmonary disease) (HCC)    Crohn's disease (HCC) Deteriorating disk   Crohn's disease (HCC)    Depression    Encounter for assessment of implantable cardioverter-defibrillator (ICD) 04/14/2020   GERD (gastroesophageal reflux disease)    History of lung cancer    History of MI (myocardial infarction) 05/05/2019   History of stroke    2   Hyperlipidemia    Hypertension    Hypogonadism in male    ICD - Single chamber Abbott Vascular GALLANT VR ICD 09/10/2019 09/10/2019    Kidney disease 01/21/2022   chronic   Lung cancer (HCC)    Major depressive disorder    Mental disorder    Neuropathy    Oxygen dependent    Rheumatoid arthritis of left wrist (HCC)    Seizure (HCC)    No recent seizures; continue Depakote   Sleep apnea    Stage 3 chronic kidney disease (HCC)    Stroke [facility])     Past Surgical History:  Procedure Laterality Date   A-V CARDIAC PACEMAKER INSERTION  09/2018   With a defibrillator   ABDOMINAL SURGERY  2009   Done at [facility]   APPENDECTOMY     APPENDECTOMY  2013   Done at [facility] ACUTE MI REVASCULARIZATION N/A 05/05/2019   Procedure: Coronary/Graft Acute MI Revascularization;  Surgeon: Jimmy Decamp, MD;  Location: [facility] INVASIVE CV LAB;  Service: Nelson;  Laterality: N/A;   EYE SURGERY     ICD IMPLANT N/A 09/10/2019   Procedure: ICD IMPLANT;  Surgeon: Jimmy Maw, MD;  Location: [facility] INVASIVE CV LAB;  Service: Nelson;  Laterality: N/A;   intestestine for chrohns disease     LEFT HEART CATH AND CORONARY ANGIOGRAPHY N/A 05/05/2019   Procedure: LEFT HEART CATH AND CORONARY ANGIOGRAPHY;  Surgeon: Jimmy Decamp, MD;  Location: [facility] INVASIVE CV LAB;  Service: Nelson;  Laterality: N/A;   PORT-A-CATH REMOVAL      Current Medications: No outpatient medications have been marked as taking for the 09/05/23 encounter (Appointment)  with Jimmy Dibble, NP.     Allergies:   Bee venom, Iodinated contrast media, Chantix [varenicline tartrate], and Iodinated contrast media   Social History   Socioeconomic History   Marital status: Significant Other    Spouse name: Not on file   Number of children: 5   Years of education: Not on file   Highest education level: Not on file  Occupational History   Not on file  Tobacco Use   Smoking status: Every Day    Types: Cigarettes   Smokeless tobacco: Current    Types: Snuff   Tobacco comments:    3 cigarettes daily   Vaping Use   Vaping status: Never Used   Substance and Sexual Activity   Alcohol use: Not on file    Comment: Occasional   Drug use: Yes    Types: Cocaine, Marijuana    Comment: Former Cocaine user   Sexual activity: Yes  Other Topics Concern   Not on file  Social History Narrative   ** Merged History Encounter **       Social Determinants of Health   Financial Resource Strain: High Risk (05/31/2019)   Overall Financial Resource Strain (CARDIA)    Difficulty of Paying Living Expenses: Hard  Food Insecurity: Food Insecurity Present (05/31/2019)   Hunger Vital Sign    Worried About Running Out of Food in the Last Year: Sometimes true    Ran Out of Food in the Last Year: Sometimes true  Transportation Needs: Unmet Transportation Needs (05/31/2019)   PRAPARE - Administrator, Civil Service (Medical): No    Lack of Transportation (Non-Medical): Yes  Physical Activity: Not on file  Stress: Not on file  Social Connections: Not on file     Family History: The patient's family history includes Diabetes in his brother; Drug abuse in his daughter.  ROS:   Please see the history of present illness.     All other systems reviewed and are negative.  EKGs/Labs/Other Studies Reviewed:    The following studies were reviewed today:  Cardiac Studies & Procedures   CARDIAC CATHETERIZATION  CARDIAC CATHETERIZATION 05/05/2019  Narrative Coronary Angiography 05/05/2019: Prox LAD to Mid LAD lesion is 100% stenosed S/P aspiration thrombectomy followed by overlapping 3.5 x 20 and a 3.0 x 12 mm Synergy DES distally, stenosis reduced from 10 percent to 0%.  D1 has ostial 20 to 30% stenosis. Circumflex: Mild disease in the proximal and, OM 2 is very tiny and severely diseased. RCA is dominant, proximal 50% stenosis. LV: EDP markedly elevated at 27 mmHg.  EF 10 to 15% with anterolateral akinesis.  110 mL contrast utilized, 6.9 minutes of fluoroscopy time.  Recommendation: Patient will be kept in the intensive care unit, he may  need a hospital admission for 48 hours or more in view of severe LV dysfunction unless he does well clinically.  I discussed the findings with his wife on the telephone.  Findings Coronary Findings Diagnostic  Dominance: Right  Left Anterior Descending Prox LAD to Mid LAD lesion is 100% stenosed. The lesion is type C and thrombotic.  Left Circumflex Vessel is large.  First Obtuse Marginal Branch Vessel is moderate in size. 1st Mrg lesion is 20% stenosed.  Second Obtuse Marginal Branch Vessel is small in size. 2nd Mrg lesion is 90% stenosed.  Right Coronary Artery Vessel is large. Prox RCA lesion is 50% stenosed.  Intervention  Prox LAD to Mid LAD lesion Stent Lesion length:  15 mm.  CATH VISTA GUIDE 6FR XB3.5 guide catheter was inserted. Lesion crossed with guidewire using a WIRE COUGAR XT STRL 190CM. Pre-stent angioplasty was performed using a BALLOON SAPPHIRE 2.5X12. A drug-eluting stent was successfully placed using a STENT SYNERGY DES 3.5X20. Stent strut is well apposed. Stent overlaps previously placed stent. Post-stent angioplasty was performed.  3.0x12 mm Synergy overlapped distal to the previous to the proximal stent deployed due to residual stenosis of 50% and 3.0x12 mm Synergy stent balloon at 16 atm between stent struts. Thrombectomy Aspiration thrombectomy performed using a CATH EXTRAC PRONTO 5.87F 138CM. 3 passes taken. Post-Intervention Lesion Assessment The intervention was successful. Pre-interventional TIMI flow is 0. Post-intervention TIMI flow is 3. No complications occurred at this lesion. There is a 0% residual stenosis post intervention.     ECHOCARDIOGRAM  PCV ECHOCARDIOGRAM COMPLETE 04/07/2021  Narrative Echocardiogram 04/07/2021: Left ventricle cavity is normal in size. Normal left ventricular wall thickness. Severe global hypokinesis and apical anterior, apical akinesis. LV systolic function 20-25%. Doppler evidence of grade I (impaired) diastolic  dysfunction, normal LAP. No significant valvular abnormality. No evidence of pulmonary hypertension. Compared to previous study on 04/10/2020, there is minimal decrease in LVEF from 25-30%.             Echocardiogram 04/07/2021:  Left ventricle cavity is normal in size. Normal left ventricular wall  thickness. Severe global hypokinesis and apical anterior, apical akinesis.  LV systolic function 20-25%. Doppler evidence of grade I (impaired)  diastolic dysfunction, normal LAP.  No significant valvular abnormality.  No evidence of pulmonary hypertension.  Compared to previous study on 04/10/2020, there is minimal decrease in LVEF  from 25-30%.   EKG:  EKG is  ordered today.  The ekg ordered today demonstrates SR, HR 66 bpm.   Recent Labs: 01/25/2023: BUN 14; Creatinine, Ser 1.34; Hemoglobin 13.8; Platelets 238; Potassium 4.5; Sodium 142  Recent Lipid Panel    Component Value Date/Time   CHOL 78 (L) 01/09/2020 1118   TRIG 112 01/09/2020 1118   HDL 28 (L) 01/09/2020 1118   CHOLHDL 2.8 01/09/2020 1118   CHOLHDL 3.5 05/05/2019 0312   VLDL 28 05/05/2019 0312   LDLCALC 29 01/09/2020 1118     Risk Assessment/Calculations:      No BP recorded.  {Refresh Note OR Click here to enter BP  :1}***         Physical Exam:    VS:  There were no vitals taken for this visit.    Wt Readings from Last 3 Encounters:  01/25/23 150 lb (68 kg)  02/10/22 154 lb 3.2 oz (69.9 kg)  12/17/21 162 lb 4 oz (73.6 kg)     GEN: Cachectic,  in no acute distress HEENT: Normal NECK: No JVD; No carotid bruits LYMPHATICS: No lymphadenopathy CARDIAC: RRR, no murmurs, rubs, gallops RESPIRATORY:  Clear to auscultation without rales, wheezing or rhonchi  ABDOMEN: Soft, non-tender, non-distended MUSCULOSKELETAL:  No edema; No deformity  SKIN: Warm and dry NEUROLOGIC:  Alert and oriented x 3 PSYCHIATRIC:  Normal affect   ASSESSMENT:    No diagnosis found.  PLAN:    In order of problems listed  above:  CAD-s/p DES to LAD in 2020. Stable with no anginal symptoms. No indication for ischemic evaluation.  Continue aspirin 81 mg daily, continue Lipitor 80 mg daily, continue metoprolol 50 mg twice daily Chronic systolic heart failure-NYHA class I, euvolemic.  Continue metoprolol 50 mg twice a day, continue Ivabradine 5 mg twice daily, continue Entresto 49-51 mg twice daily.  NICM/ICD-currently following with Dr. Ladona Ridgel, previously established with Jimmy Nelson.  He is moped dependent, and would like to see if he can follow-up with EP in [place].  Will reach out to Dr. Ladona Ridgel to see if he can transfer to see Dr. Elberta Fortis. Tobacco abuse-he has cut his tobacco intake down significantly from 3 packs/day to 3 cigarettes/day.  Congratulated him on this and encourage complete cessation. Hyperlipidemia- most recent LDL was well controlled at 54, continue Lipitor at current dose.      Disposition - Decrease metoprolol to 50 mg twice/day, return in 6 months.       Medication Adjustments/Labs and Tests Ordered: Current medicines are reviewed at length with the patient today.  Concerns regarding medicines are outlined above.  No orders of the defined types were placed in this encounter.  No orders of the defined types were placed in this encounter.   There are no Patient Instructions on file for this visit.   Signed, Jimmy Dibble, NP  09/03/2023 7:52 PM    [facility]

## 2023-09-05 ENCOUNTER — Ambulatory Visit: Payer: Medicare HMO | Admitting: Cardiology

## 2023-09-08 NOTE — Progress Notes (Deleted)
 Cardiology Office Note:    Date:  09/08/2023   ID:  Jimmy Govern Sr., DOB 1966/09/17, MRN [id number]  PCP:  Charlott Rakes, MD   [facility] Providers Cardiologist:  Garwin Brothers, MD Electrophysiologist:  Lewayne Bunting, MD     Referring MD: Charlott Rakes, MD   CC: follow up of CAD  History of Present Illness:    Jimmy Nelson. is a 57 y.o. male with a hx of CAD s/p PCI/DES LAD 2020, VF arrest, NICM, ICD 2020, COPD oxygen dependent, GERD, CKD stage III, depression, alcohol abuse, tobacco use, Crohn's disease, seizure.  07/07/2023 device check revealed normal functioning 04/07/2021 echo EF 20 to 25%, grade 1 DD, severe global hypokinesis and apical anterior, apical akinesis 09/10/2019 ICD implantation 05/05/2019 heart cath acute anterolateral wall STEMI s/p primary PCI of culprit LAD with 2 overlapping stents.  Previously established with Dr. Jacinto Halim of [facility] cardiovascular however recently transitioned to establish care with Dr. Tomie China due to constraints related to riding a moped to [place].   Most recently evaluated on 01/25/2023, for follow-up of his CAD.  Most bothered by low fatigue, low blood pressure readings, we decrease his metoprolol to 50 mg twice daily.  Past Medical History:  Diagnosis Date   Amnesia    Anemia    Anxiety and depression    Chronic systolic heart failure (HCC) 04/14/2020   Congestive heart failure, hypertensive (HCC)    COPD (chronic obstructive pulmonary disease) (HCC)    Crohn's disease (HCC) Deteriorating disk   Crohn's disease (HCC)    Depression    Encounter for assessment of implantable cardioverter-defibrillator (ICD) 04/14/2020   GERD (gastroesophageal reflux disease)    History of lung cancer    History of MI (myocardial infarction) 05/05/2019   History of stroke    2   Hyperlipidemia    Hypertension    Hypogonadism in male    ICD - Single chamber Abbott Vascular GALLANT VR ICD 09/10/2019 09/10/2019    Kidney disease 01/21/2022   chronic   Lung cancer (HCC)    Major depressive disorder    Mental disorder    Neuropathy    Oxygen dependent    Rheumatoid arthritis of left wrist (HCC)    Seizure (HCC)    No recent seizures; continue Depakote   Sleep apnea    Stage 3 chronic kidney disease (HCC)    Stroke [facility])     Past Surgical History:  Procedure Laterality Date   A-V CARDIAC PACEMAKER INSERTION  09/2018   With a defibrillator   ABDOMINAL SURGERY  2009   Done at [facility]   APPENDECTOMY     APPENDECTOMY  2013   Done at [facility] ACUTE MI REVASCULARIZATION N/A 05/05/2019   Procedure: Coronary/Graft Acute MI Revascularization;  Surgeon: Yates Decamp, MD;  Location: [facility] INVASIVE CV LAB;  Service: Cardiovascular;  Laterality: N/A;   EYE SURGERY     ICD IMPLANT N/A 09/10/2019   Procedure: ICD IMPLANT;  Surgeon: Marinus Maw, MD;  Location: [facility] INVASIVE CV LAB;  Service: Cardiovascular;  Laterality: N/A;   intestestine for chrohns disease     LEFT HEART CATH AND CORONARY ANGIOGRAPHY N/A 05/05/2019   Procedure: LEFT HEART CATH AND CORONARY ANGIOGRAPHY;  Surgeon: Yates Decamp, MD;  Location: [facility] INVASIVE CV LAB;  Service: Cardiovascular;  Laterality: N/A;   PORT-A-CATH REMOVAL      Current Medications: No outpatient medications have been marked as taking for the 09/09/23 encounter (Appointment)  with Flossie Dibble, NP.     Allergies:   Bee venom, Iodinated contrast media, Chantix [varenicline tartrate], and Iodinated contrast media   Social History   Socioeconomic History   Marital status: Significant Other    Spouse name: Not on file   Number of children: 5   Years of education: Not on file   Highest education level: Not on file  Occupational History   Not on file  Tobacco Use   Smoking status: Every Day    Types: Cigarettes   Smokeless tobacco: Current    Types: Snuff   Tobacco comments:    3 cigarettes daily   Vaping Use   Vaping status: Never Used   Substance and Sexual Activity   Alcohol use: Not on file    Comment: Occasional   Drug use: Yes    Types: Cocaine, Marijuana    Comment: Former Cocaine user   Sexual activity: Yes  Other Topics Concern   Not on file  Social History Narrative   ** Merged History Encounter **       Social Drivers of Health   Financial Resource Strain: High Risk (05/31/2019)   Overall Financial Resource Strain (CARDIA)    Difficulty of Paying Living Expenses: Hard  Food Insecurity: Food Insecurity Present (05/31/2019)   Hunger Vital Sign    Worried About Running Out of Food in the Last Year: Sometimes true    Ran Out of Food in the Last Year: Sometimes true  Transportation Needs: Unmet Transportation Needs (05/31/2019)   PRAPARE - Administrator, Civil Service (Medical): No    Lack of Transportation (Non-Medical): Yes  Physical Activity: Not on file  Stress: Not on file  Social Connections: Not on file     Family History: The patient's family history includes Diabetes in his brother; Drug abuse in his daughter.  ROS:   Please see the history of present illness.     All other systems reviewed and are negative.  EKGs/Labs/Other Studies Reviewed:    The following studies were reviewed today:  Cardiac Studies & Procedures   CARDIAC CATHETERIZATION  CARDIAC CATHETERIZATION 05/05/2019  Narrative Coronary Angiography 05/05/2019: Prox LAD to Mid LAD lesion is 100% stenosed S/P aspiration thrombectomy followed by overlapping 3.5 x 20 and a 3.0 x 12 mm Synergy DES distally, stenosis reduced from 10 percent to 0%.  D1 has ostial 20 to 30% stenosis. Circumflex: Mild disease in the proximal and, OM 2 is very tiny and severely diseased. RCA is dominant, proximal 50% stenosis. LV: EDP markedly elevated at 27 mmHg.  EF 10 to 15% with anterolateral akinesis.  110 mL contrast utilized, 6.9 minutes of fluoroscopy time.  Recommendation: Patient will be kept in the intensive care unit, he may need  a hospital admission for 48 hours or more in view of severe LV dysfunction unless he does well clinically.  I discussed the findings with his wife on the telephone.  Findings Coronary Findings Diagnostic  Dominance: Right  Left Anterior Descending Prox LAD to Mid LAD lesion is 100% stenosed. The lesion is type C and thrombotic.  Left Circumflex Vessel is large.  First Obtuse Marginal Branch Vessel is moderate in size. 1st Mrg lesion is 20% stenosed.  Second Obtuse Marginal Branch Vessel is small in size. 2nd Mrg lesion is 90% stenosed.  Right Coronary Artery Vessel is large. Prox RCA lesion is 50% stenosed.  Intervention  Prox LAD to Mid LAD lesion Stent Lesion length:  15 mm.  CATH VISTA GUIDE 6FR XB3.5 guide catheter was inserted. Lesion crossed with guidewire using a WIRE COUGAR XT STRL 190CM. Pre-stent angioplasty was performed using a BALLOON SAPPHIRE 2.5X12. A drug-eluting stent was successfully placed using a STENT SYNERGY DES 3.5X20. Stent strut is well apposed. Stent overlaps previously placed stent. Post-stent angioplasty was performed.  3.0x12 mm Synergy overlapped distal to the previous to the proximal stent deployed due to residual stenosis of 50% and 3.0x12 mm Synergy stent balloon at 16 atm between stent struts. Thrombectomy Aspiration thrombectomy performed using a CATH EXTRAC PRONTO 5.32F 138CM. 3 passes taken. Post-Intervention Lesion Assessment The intervention was successful. Pre-interventional TIMI flow is 0. Post-intervention TIMI flow is 3. No complications occurred at this lesion. There is a 0% residual stenosis post intervention.    ECHOCARDIOGRAM  PCV ECHOCARDIOGRAM COMPLETE 04/07/2021  Narrative Echocardiogram 04/07/2021: Left ventricle cavity is normal in size. Normal left ventricular wall thickness. Severe global hypokinesis and apical anterior, apical akinesis. LV systolic function 20-25%. Doppler evidence of grade I (impaired) diastolic dysfunction,  normal LAP. No significant valvular abnormality. No evidence of pulmonary hypertension. Compared to previous study on 04/10/2020, there is minimal decrease in LVEF from 25-30%.             Echocardiogram 04/07/2021:  Left ventricle cavity is normal in size. Normal left ventricular wall  thickness. Severe global hypokinesis and apical anterior, apical akinesis.  LV systolic function 20-25%. Doppler evidence of grade I (impaired)  diastolic dysfunction, normal LAP.  No significant valvular abnormality.  No evidence of pulmonary hypertension.  Compared to previous study on 04/10/2020, there is minimal decrease in LVEF  from 25-30%.   EKG:  EKG is  ordered today.  The ekg ordered today demonstrates SR, HR 66 bpm.   Recent Labs: 01/25/2023: BUN 14; Creatinine, Ser 1.34; Hemoglobin 13.8; Platelets 238; Potassium 4.5; Sodium 142  Recent Lipid Panel    Component Value Date/Time   CHOL 78 (L) 01/09/2020 1118   TRIG 112 01/09/2020 1118   HDL 28 (L) 01/09/2020 1118   CHOLHDL 2.8 01/09/2020 1118   CHOLHDL 3.5 05/05/2019 0312   VLDL 28 05/05/2019 0312   LDLCALC 29 01/09/2020 1118     Risk Assessment/Calculations:      No BP recorded.  {Refresh Note OR Click here to enter BP  :1}***         Physical Exam:    VS:  There were no vitals taken for this visit.    Wt Readings from Last 3 Encounters:  01/25/23 150 lb (68 kg)  02/10/22 154 lb 3.2 oz (69.9 kg)  12/17/21 162 lb 4 oz (73.6 kg)     GEN: Cachectic,  in no acute distress HEENT: Normal NECK: No JVD; No carotid bruits LYMPHATICS: No lymphadenopathy CARDIAC: RRR, no murmurs, rubs, gallops RESPIRATORY:  Clear to auscultation without rales, wheezing or rhonchi  ABDOMEN: Soft, non-tender, non-distended MUSCULOSKELETAL:  No edema; No deformity  SKIN: Warm and dry NEUROLOGIC:  Alert and oriented x 3 PSYCHIATRIC:  Normal affect   ASSESSMENT:    No diagnosis found.  PLAN:    In order of problems listed above:  CAD-s/p DES  to LAD in 2020. Stable with no anginal symptoms. No indication for ischemic evaluation.  Continue aspirin 81 mg daily, continue Lipitor 80 mg daily, continue metoprolol 50 mg twice daily Chronic systolic heart failure-NYHA class I, euvolemic.  Continue metoprolol 50 mg twice a day, continue Ivabradine 5 mg twice daily, continue Entresto 49-51 mg twice daily. NICM/ICD-currently  following with Dr. Ladona Ridgel, previously established with Dr. Jacinto Halim.  He is moped dependent, and would like to see if he can follow-up with EP in [place].  Will reach out to Dr. Ladona Ridgel to see if he can transfer to see Dr. Elberta Fortis. Tobacco abuse-he has cut his tobacco intake down significantly from 3 packs/day to 3 cigarettes/day.  Congratulated him on this and encourage complete cessation. Hyperlipidemia- most recent LDL was well controlled at 54, continue Lipitor at current dose.      Disposition - Decrease metoprolol to 50 mg twice/day, return in 6 months.       Medication Adjustments/Labs and Tests Ordered: Current medicines are reviewed at length with the patient today.  Concerns regarding medicines are outlined above.  No orders of the defined types were placed in this encounter.  No orders of the defined types were placed in this encounter.   There are no Patient Instructions on file for this visit.   Signed, Flossie Dibble, NP  09/08/2023 6:58 PM    [facility]

## 2023-09-09 ENCOUNTER — Ambulatory Visit: Payer: Medicare HMO | Admitting: Cardiology

## 2023-09-11 DIAGNOSIS — J449 Chronic obstructive pulmonary disease, unspecified: Secondary | ICD-10-CM | POA: Diagnosis not present

## 2023-09-27 ENCOUNTER — Other Ambulatory Visit: Payer: Self-pay

## 2023-09-27 DIAGNOSIS — I5042 Chronic combined systolic (congestive) and diastolic (congestive) heart failure: Secondary | ICD-10-CM

## 2023-09-27 MED ORDER — ENTRESTO 49-51 MG PO TABS
1.0000 | ORAL_TABLET | Freq: Two times a day (BID) | ORAL | 3 refills | Status: DC
Start: 2023-09-27 — End: 2024-01-18

## 2023-09-28 DIAGNOSIS — K219 Gastro-esophageal reflux disease without esophagitis: Secondary | ICD-10-CM | POA: Diagnosis not present

## 2023-09-28 DIAGNOSIS — I110 Hypertensive heart disease with heart failure: Secondary | ICD-10-CM | POA: Diagnosis not present

## 2023-09-28 DIAGNOSIS — J449 Chronic obstructive pulmonary disease, unspecified: Secondary | ICD-10-CM | POA: Diagnosis not present

## 2023-10-06 ENCOUNTER — Ambulatory Visit (INDEPENDENT_AMBULATORY_CARE_PROVIDER_SITE_OTHER): Payer: 59

## 2023-10-06 DIAGNOSIS — I428 Other cardiomyopathies: Secondary | ICD-10-CM | POA: Diagnosis not present

## 2023-10-06 LAB — CUP PACEART REMOTE DEVICE CHECK
Battery Remaining Longevity: 85 mo
Battery Remaining Percentage: 70 %
Battery Voltage: 2.95 V
Brady Statistic RV Percent Paced: 1 %
Date Time Interrogation Session: 20250109010025
HighPow Impedance: 73 Ohm
Implantable Lead Connection Status: 753985
Implantable Lead Implant Date: 20201214
Implantable Lead Location: 753860
Implantable Pulse Generator Implant Date: 20201214
Lead Channel Impedance Value: 600 Ohm
Lead Channel Pacing Threshold Amplitude: 0.5 V
Lead Channel Pacing Threshold Pulse Width: 0.5 ms
Lead Channel Sensing Intrinsic Amplitude: 12 mV
Lead Channel Setting Pacing Amplitude: 2.5 V
Lead Channel Setting Pacing Pulse Width: 0.5 ms
Lead Channel Setting Sensing Sensitivity: 0.5 mV
Pulse Gen Serial Number: 111013076
Zone Setting Status: 755011

## 2023-10-12 DIAGNOSIS — Z13220 Encounter for screening for lipoid disorders: Secondary | ICD-10-CM | POA: Diagnosis not present

## 2023-10-12 DIAGNOSIS — E876 Hypokalemia: Secondary | ICD-10-CM | POA: Diagnosis not present

## 2023-10-17 ENCOUNTER — Encounter: Payer: Self-pay | Admitting: Cardiology

## 2023-10-17 ENCOUNTER — Ambulatory Visit: Payer: 59 | Attending: Cardiology | Admitting: Cardiology

## 2023-10-17 VITALS — BP 122/84 | HR 80 | Ht 71.0 in | Wt 152.8 lb

## 2023-10-17 DIAGNOSIS — R569 Unspecified convulsions: Secondary | ICD-10-CM | POA: Diagnosis not present

## 2023-10-17 DIAGNOSIS — I5022 Chronic systolic (congestive) heart failure: Secondary | ICD-10-CM | POA: Diagnosis not present

## 2023-10-17 DIAGNOSIS — J449 Chronic obstructive pulmonary disease, unspecified: Secondary | ICD-10-CM | POA: Diagnosis not present

## 2023-10-17 DIAGNOSIS — I110 Hypertensive heart disease with heart failure: Secondary | ICD-10-CM | POA: Diagnosis not present

## 2023-10-17 DIAGNOSIS — I428 Other cardiomyopathies: Secondary | ICD-10-CM | POA: Diagnosis not present

## 2023-10-17 DIAGNOSIS — I2510 Atherosclerotic heart disease of native coronary artery without angina pectoris: Secondary | ICD-10-CM | POA: Diagnosis not present

## 2023-10-17 DIAGNOSIS — Z6822 Body mass index (BMI) 22.0-22.9, adult: Secondary | ICD-10-CM | POA: Diagnosis not present

## 2023-10-17 DIAGNOSIS — N1831 Chronic kidney disease, stage 3a: Secondary | ICD-10-CM | POA: Diagnosis not present

## 2023-10-17 DIAGNOSIS — Z23 Encounter for immunization: Secondary | ICD-10-CM | POA: Diagnosis not present

## 2023-10-17 LAB — CUP PACEART INCLINIC DEVICE CHECK
Brady Statistic RV Percent Paced: 1 % — CL
Date Time Interrogation Session: 20250120150135
HighPow Impedance: 68 Ohm
Implantable Lead Connection Status: 753985
Implantable Lead Implant Date: 20201214
Implantable Lead Location: 753860
Implantable Pulse Generator Implant Date: 20201214
Lead Channel Impedance Value: 610 Ohm
Lead Channel Pacing Threshold Amplitude: 0.5 V
Lead Channel Pacing Threshold Pulse Width: 0.5 ms
Lead Channel Sensing Intrinsic Amplitude: 0 mV
Lead Channel Sensing Intrinsic Amplitude: 12 mV
Lead Channel Sensing Intrinsic Amplitude: 12 mV
Pulse Gen Serial Number: 111013076

## 2023-10-17 NOTE — Progress Notes (Signed)
  Electrophysiology Office Note:   Date:  10/17/2023  ID:  Jimmy Govern Sr., DOB [birth year], MRN [id number]  Primary Cardiologist: Garwin Brothers, MD Primary Heart Failure: None Electrophysiologist: Lewayne Bunting, MD      History of Present Illness:   Jimmy Nelson. is a 58 y.o. male with h/o coronary artery disease post LAD stent, VF arrest, chronic systolic heart failure, oxygen dependent COPD, CKD stage III, alcohol and tobacco abuse, Crohn's disease, seizures seen today for routine electrophysiology followup.   Since last being seen in our clinic the patient reports doing overall well.  He has no acute complaints.  he denies chest pain, palpitations, dyspnea, PND, orthopnea, nausea, vomiting, dizziness, syncope, edema, weight gain, or early satiety.   Review of systems complete and found to be negative unless listed in HPI.      EP Information / Studies Reviewed:    EKG is ordered today. Personal review as below.  EKG Interpretation Date/Time:  Monday October 17 2023 12:15:46 EST Ventricular Rate:  80 PR Interval:  210 QRS Duration:  80 QT Interval:  392 QTC Calculation: 452 R Axis:   42  Text Interpretation: Sinus rhythm with 1st degree A-V block Otherwise normal ECG Confirmed by Baker Moronta ([id number]) on 10/17/2023 4:12:55 PM   ICD Interrogation-  reviewed in detail today,  See PACEART report.  Device History: Abbott Single Chamber ICD implanted 09/10/19 for cardiac arrest History of appropriate therapy: No History of AAD therapy: No   Risk Assessment/Calculations:              Physical Exam:   VS:  BP 122/84   Pulse 80   Ht 5\' 11"  (1.803 m)   Wt 152 lb 12.8 oz (69.3 kg)   SpO2 95%   BMI 21.31 kg/m    Wt Readings from Last 3 Encounters:  10/17/23 152 lb 12.8 oz (69.3 kg)  01/25/23 150 lb (68 kg)  02/10/22 154 lb 3.2 oz (69.9 kg)     GEN: Well nourished, well developed in no acute distress NECK: No JVD; No carotid bruits CARDIAC: Regular rate  and rhythm, no murmurs, rubs, gallops RESPIRATORY:  Clear to auscultation without rales, wheezing or rhonchi  ABDOMEN: Soft, non-tender, non-distended EXTREMITIES:  No edema; No deformity   ASSESSMENT AND PLAN:    Chronic systolic dysfunction s/p Abbott single chamber ICD  euvolemic today Stable on an appropriate medical regimen Normal ICD function See Pace Art report No changes today  2.  Coronary artery disease: Post LAD stent.  Plan per primary cardiology.  3.  Tobacco abuse: Complete cessation encouraged  4.  Hyperlipidemia: Continue statin per primary cardiology  Disposition:   Follow up with Dr. Elberta Fortis in 12 months   Signed, Kingjames Coury Jorja Loa, MD

## 2023-10-17 NOTE — Patient Instructions (Signed)

## 2023-10-20 DIAGNOSIS — Z87891 Personal history of nicotine dependence: Secondary | ICD-10-CM | POA: Diagnosis not present

## 2023-10-20 DIAGNOSIS — Z122 Encounter for screening for malignant neoplasm of respiratory organs: Secondary | ICD-10-CM | POA: Diagnosis not present

## 2023-10-25 DIAGNOSIS — Z23 Encounter for immunization: Secondary | ICD-10-CM | POA: Diagnosis not present

## 2023-10-28 DIAGNOSIS — N23 Unspecified renal colic: Secondary | ICD-10-CM | POA: Diagnosis not present

## 2023-10-29 DIAGNOSIS — I110 Hypertensive heart disease with heart failure: Secondary | ICD-10-CM | POA: Diagnosis not present

## 2023-10-29 DIAGNOSIS — J449 Chronic obstructive pulmonary disease, unspecified: Secondary | ICD-10-CM | POA: Diagnosis not present

## 2023-10-29 DIAGNOSIS — N1831 Chronic kidney disease, stage 3a: Secondary | ICD-10-CM | POA: Diagnosis not present

## 2023-11-01 ENCOUNTER — Other Ambulatory Visit (HOSPITAL_BASED_OUTPATIENT_CLINIC_OR_DEPARTMENT_OTHER): Payer: Self-pay | Admitting: Physician Assistant

## 2023-11-01 DIAGNOSIS — K8000 Calculus of gallbladder with acute cholecystitis without obstruction: Secondary | ICD-10-CM | POA: Diagnosis not present

## 2023-11-01 DIAGNOSIS — Z6822 Body mass index (BMI) 22.0-22.9, adult: Secondary | ICD-10-CM | POA: Diagnosis not present

## 2023-11-02 ENCOUNTER — Ambulatory Visit (HOSPITAL_BASED_OUTPATIENT_CLINIC_OR_DEPARTMENT_OTHER)
Admission: RE | Admit: 2023-11-02 | Discharge: 2023-11-02 | Discharge status: Home / Self Care | Payer: 59 | Source: Ambulatory Visit | Attending: Physician Assistant | Admitting: Physician Assistant

## 2023-11-02 DIAGNOSIS — K8020 Calculus of gallbladder without cholecystitis without obstruction: Secondary | ICD-10-CM

## 2023-11-02 DIAGNOSIS — K8000 Calculus of gallbladder with acute cholecystitis without obstruction: Secondary | ICD-10-CM

## 2023-11-02 DIAGNOSIS — R1011 Right upper quadrant pain: Secondary | ICD-10-CM | POA: Diagnosis not present

## 2023-11-02 DIAGNOSIS — K838 Other specified diseases of biliary tract: Secondary | ICD-10-CM | POA: Diagnosis not present

## 2023-11-08 DIAGNOSIS — K8010 Calculus of gallbladder with chronic cholecystitis without obstruction: Secondary | ICD-10-CM | POA: Diagnosis not present

## 2023-11-11 ENCOUNTER — Ambulatory Visit: Payer: 59

## 2023-11-11 VITALS — BP 96/62 | HR 52 | Ht 71.5 in | Wt 145.2 lb

## 2023-11-11 DIAGNOSIS — I2510 Atherosclerotic heart disease of native coronary artery without angina pectoris: Secondary | ICD-10-CM

## 2023-11-11 DIAGNOSIS — Z95810 Presence of automatic (implantable) cardiac defibrillator: Secondary | ICD-10-CM | POA: Diagnosis not present

## 2023-11-11 DIAGNOSIS — I5022 Chronic systolic (congestive) heart failure: Secondary | ICD-10-CM

## 2023-11-11 DIAGNOSIS — C3490 Malignant neoplasm of unspecified part of unspecified bronchus or lung: Secondary | ICD-10-CM | POA: Insufficient documentation

## 2023-11-11 DIAGNOSIS — I110 Hypertensive heart disease with heart failure: Secondary | ICD-10-CM | POA: Diagnosis not present

## 2023-11-11 DIAGNOSIS — I10 Essential (primary) hypertension: Secondary | ICD-10-CM | POA: Insufficient documentation

## 2023-11-11 DIAGNOSIS — I639 Cerebral infarction, unspecified: Secondary | ICD-10-CM | POA: Insufficient documentation

## 2023-11-11 DIAGNOSIS — Z01810 Encounter for preprocedural cardiovascular examination: Secondary | ICD-10-CM

## 2023-11-11 DIAGNOSIS — G4730 Sleep apnea, unspecified: Secondary | ICD-10-CM | POA: Insufficient documentation

## 2023-11-11 DIAGNOSIS — E785 Hyperlipidemia, unspecified: Secondary | ICD-10-CM | POA: Insufficient documentation

## 2023-11-11 DIAGNOSIS — F99 Mental disorder, not otherwise specified: Secondary | ICD-10-CM | POA: Insufficient documentation

## 2023-11-11 DIAGNOSIS — Z8673 Personal history of transient ischemic attack (TIA), and cerebral infarction without residual deficits: Secondary | ICD-10-CM | POA: Insufficient documentation

## 2023-11-11 DIAGNOSIS — R413 Other amnesia: Secondary | ICD-10-CM | POA: Insufficient documentation

## 2023-11-11 NOTE — Assessment & Plan Note (Signed)
Last echocardiogram for comparison is from July 2022 LVEF 20 to 25%. Compensated, euvolemic. NYHA class II Salt and fluid restriction and recommendations less than 2 g/day and 2 L/day respectively. Currently not on any scheduled loop diuretic.  Guideline directed medical therapy for cardiomyopathy restricted due to soft blood pressure anemia, baseline kidney function and there is urinary tract infections. Currently on metoprolol succinate 50 mg he takes 2 times a day and tolerating well. Continues on Entresto 49 mg / 51 mg 2 times daily. Metoprolol SGLT2 inhibitors due to urinary tract infections and hygiene. Not on spironolactone with his soft blood pressures and renal function.

## 2023-11-11 NOTE — Progress Notes (Signed)
Cardiology Consultation:    Date:  11/11/2023   ID:  Jimmy Govern Sr., DOB 1966-09-22, MRN [id number]  PCP:  Jimmy Rakes, MD  Cardiologist: Dr. Tomie Nelson. Electrophysiologist: Dr. Elberta Nelson  Referring MD: Jimmy Rakes, MD   Chief Complaint  Patient presents with   Clearance 11/14/2023    Gall Bladder removal     ASSESSMENT AND PLAN:   Jimmy Nelson 58 year old male with history of CAD s/p anterolateral STEMI in August 2020 s/p PCI of LAD, CHF with reduced LVEF 20 to 25% on recent echocardiogram July 2022, history of V-fib arrest s/p Abbott single-chamber ICD implant in December 2020, COPD O2 dependent, CKD stage III, Crohn's disease s/p bowel resection surgeries in the past, cholelithiasis, seizure disorder, smokes tobacco and quit cold 10 years ago.  Today here for preop cardiovascular risk assessment prior to elective cholecystectomy being planned with [place] surgery tentatively planned for 11-14-2023 with Dr. Lequita Nelson.  Problem List Items Addressed This Visit     Preoperative cardiovascular examination - Primary   Being planned for elective cholecystectomy 22-17-2025 at [facility] with Dr. Lequita Nelson No active cardiac symptoms. Stable from a cardiac standpoint with respect to his CAD, CHF and ICD history. No anginal symptoms.  Euvolemic and compensated from heart failure standpoint. Normal device functioning from ICD.  Functional status appears to have aborted 4 METS activity.  From cardiac standpoint okay to proceed with his elective surgery for a cholecystectomy as being planned. He has already held his aspirin since Tuesday of this week and anticipation for the surgery. This is reasonable.  Resume aspirin postsurgery once hemostasis is obtained and okay with the surgeon.  Perioperative ICD management as per protocol, okay to use magnet over the device if using cautery to avoid inappropriate ICD discharges.       S/P ICD (internal cardiac  defibrillator) procedure   Doing well. Last device check from October 17, 2023 was normal.  Perioperatively this can be managed with magnet over the device as per protocol if electrocautery is being used for the surgery.        Congestive heart failure, hypertensive (HCC)   Last echocardiogram for comparison is from July 2022 LVEF 20 to 25%. Compensated, euvolemic. NYHA class II Salt and fluid restriction and recommendations less than 2 g/day and 2 L/day respectively. Currently not on any scheduled loop diuretic.  Guideline directed medical therapy for cardiomyopathy restricted due to soft blood pressure anemia, baseline kidney function and there is urinary tract infections. Currently on metoprolol succinate 50 mg he takes 2 times a day and tolerating well. Continues on Entresto 49 mg / 51 mg 2 times daily. Metoprolol SGLT2 inhibitors due to urinary tract infections and hygiene. Not on spironolactone with his soft blood pressures and renal function.        CAD (coronary artery disease)   No active symptoms. Functional status able to walk using his cane 3-4 trips around his house. Appears to meet more than 4 METS activity. NYHA class II.  Continue with aspirin 81 mg once daily. He is currently on holding aspirin for his upcoming surgery on February 17.  He is aware to resume it after the surgery. Continue atorvastatin 80 mg once daily. Soft blood pressures limits escalating antianginal regimen and has been on Corlanor 5 mg twice daily for years.  Tolerating well.      Return to clinic for follow-up with Dr. Tomie Nelson old with Jimmy Nelson in 6 months.   History of Present Illness:  Jimmy DOREN Sr. is a 58 y.o. male who is being seen today for preop cardiovascular exam referred by [facility] specialty group [place] surgery tentatively planned for 11-14-2023 with Dr. Lequita Nelson.  PCP is Jimmy Rakes, MD.   History of CAD s/p anterolateral STEMI August 2020 s/p  PCI of LAD, CHF with reduced EF 25-30% initially August 2020 last echocardiogram to review from July 2022 LVEF 20-25%, history of VF arrest s/p Abbott single-chamber ICD implanted 09-10-2019, COPD-O2 dependent, CKD stage III, Crohn's disease s/p bowel resection surgeries in the past, gallstones, seizure disorder, smokes tobacco, quit alcohol over 10 years ago.  Lives at home with his fiance.  Able to manage his day-to-day activities at home.  Uses a cane to ambulate.  Can go around 3-4 times around his house for exercise without any limitations.  Denies any chest pain, shortness of breath, orthopnea, paroxysmal nocturnal dyspnea.  No palpitations, lightheadedness, dizziness, falls or syncopal episodes.  Denies any ICD discharges.  No blood in urine or stools. Pain in the right quadrant attributable to his gallstones mentions has been limiting him.  Last device check 10-17-2023 normal function of the ICD, battery longevity 7.1 years.  Last EKG available to review is from 10-17-2023 at [facility] showing sinus rhythm heart rate 80/min, PR interval 210 ms, QRS duration 80 ms, QTc 452 ms.   Past Medical History:  Diagnosis Date   Acute anterolateral myocardial infarction (HCC) 05/05/2019   Acute MI, anterolateral wall, initial episode of care (HCC) 05/05/2019   S/P Prox LAD 3.5x20 and 3.0x12 Synergy 100% to 0% on 05/05/2019     Alcohol abuse 12/30/2012   Adequate for discharge     Amnesia    Anemia    Anxiety and depression    Chronic systolic heart failure (HCC) 04/14/2020   Congestive heart failure, hypertensive (HCC)    COPD (chronic obstructive pulmonary disease) (HCC)    Crohn's disease (HCC) Deteriorating disk   Crohn's disease (HCC)    Depression    Encounter for assessment of implantable cardioverter-defibrillator (ICD) 04/14/2020   Esophageal reflux 01/20/2013   GERD (gastroesophageal reflux disease)    History of lung cancer    History of MI (myocardial infarction)  05/05/2019   History of stroke    2   Hyperlipidemia    Hypertension    Hypogonadism in male    ICD - Single chamber Abbott Vascular GALLANT VR ICD 09/10/2019 09/10/2019   Kidney disease 01/21/2022   chronic   Lung cancer (HCC)    Major depressive disorder    Mental disorder    Neuropathy    Nonischemic cardiomyopathy (HCC) 08/22/2019   Oxygen dependent    Rheumatoid arthritis of left wrist (HCC)    Seizure (HCC)    No recent seizures; continue Depakote   Sleep apnea    Stage 3 chronic kidney disease (HCC)    Stroke (HCC)    Tobacco use disorder, continuous 05/05/2019    Past Surgical History:  Procedure Laterality Date   A-V CARDIAC PACEMAKER INSERTION  09/2018   With a defibrillator   ABDOMINAL SURGERY  2009   Done at [facility]   APPENDECTOMY     APPENDECTOMY  2013   Done at [facility] ACUTE MI REVASCULARIZATION N/A 05/05/2019   Procedure: Coronary/Graft Acute MI Revascularization;  Surgeon: Yates Decamp, MD;  Location: [facility] INVASIVE CV LAB;  Service: Cardiovascular;  Laterality: N/A;   EYE SURGERY     ICD IMPLANT N/A 09/10/2019  Procedure: ICD IMPLANT;  Surgeon: Marinus Maw, MD;  Location: [facility] INVASIVE CV LAB;  Service: Cardiovascular;  Laterality: N/A;   intestestine for chrohns disease     LEFT HEART CATH AND CORONARY ANGIOGRAPHY N/A 05/05/2019   Procedure: LEFT HEART CATH AND CORONARY ANGIOGRAPHY;  Surgeon: Yates Decamp, MD;  Location: [facility] INVASIVE CV LAB;  Service: Cardiovascular;  Laterality: N/A;   PORT-A-CATH REMOVAL      Current Medications: Current Meds  Medication Sig   acetaminophen (TYLENOL) 325 MG tablet Take 650 mg by mouth every 6 (six) hours as needed for moderate pain or headache.   Albuterol Sulfate (PROAIR RESPICLICK) 108 (90 Base) MCG/ACT AEPB Inhale 2 puffs into the lungs every 6 (six) hours as needed (shortness of breath and wheezing).   aspirin EC 81 MG tablet Take 81 mg by mouth daily. Swallow whole.   atorvastatin (LIPITOR) 80 MG  tablet Take 1 tablet (80 mg total) by mouth daily.   Budeson-Glycopyrrol-Formoterol (BREZTRI AEROSPHERE) 160-9-4.8 MCG/ACT AERO Inhale 2 puffs into the lungs 2 (two) times daily.   Coenzyme Q10 200 MG capsule Take 200 mg by mouth daily.   Cyanocobalamin (B-12) 2500 MCG TABS Take 2,500 mcg by mouth daily.   divalproex (DEPAKOTE) 250 MG DR tablet Take 250 mg by mouth in the morning. And 500 mg at night   docusate sodium (COLACE) 100 MG capsule Take 100 mg by mouth 2 (two) times daily.   EPINEPHrine 0.3 mg/0.3 mL IJ SOAJ injection Inject 0.3 mg into the muscle as needed for anaphylaxis.   esomeprazole (NEXIUM) 40 MG capsule Take 40 mg by mouth daily.   ferrous sulfate 325 (65 FE) MG tablet Take 1 tablet (325 mg total) by mouth daily with breakfast.   gabapentin (NEURONTIN) 600 MG tablet Take 600 mg by mouth 3 (three) times daily.   ibuprofen (ADVIL) 200 MG tablet Take 800 mg by mouth every 8 (eight) hours as needed for headache or moderate pain.   ipratropium-albuterol (DUONEB) 0.5-2.5 (3) MG/3ML SOLN Inhale 3 mLs into the lungs every 6 (six) hours as needed (shortness of breath).    ivabradine (CORLANOR) 5 MG TABS tablet Take 1 tablet (5 mg total) by mouth 2 (two) times daily with a meal. TAKE 1 TABLET BY MOUTH TWICE(2) DAILY WITH MEALS   magnesium oxide (MAG-OX) 400 MG tablet Take 2 tablets by mouth 2 (two) times daily.   mirtazapine (REMERON) 7.5 MG tablet Take 7.5 mg by mouth at bedtime.   montelukast (SINGULAIR) 10 MG tablet Take 10 mg by mouth daily.   Multiple Vitamins-Minerals (MULTIVITAMIN WITH MINERALS) tablet Take 1 tablet by mouth daily.   nicotine (NICODERM CQ - DOSED IN MG/24 HOURS) 21 mg/24hr patch Place 21 mg onto the skin daily.   nitroGLYCERIN (NITROSTAT) 0.4 MG SL tablet DISSOLVE 1 TABLET UNDER THE TONGUE EVERY 5 MINUTES AS NEEDED FOR CHEST PAIN. (MAXIMUM OF 3 DOSES)   predniSONE (DELTASONE) 10 MG tablet Take 10 mg by mouth daily with breakfast.   pyridOXINE (VITAMIN B-6) 100 MG  tablet Take 100 mg by mouth daily.   sacubitril-valsartan (ENTRESTO) 49-51 MG Take 1 tablet by mouth 2 (two) times daily.   sertraline (ZOLOFT) 100 MG tablet Take 100 mg by mouth daily.     Allergies:   Bee venom, Iodinated contrast media, Chantix [varenicline tartrate], and Iodinated contrast media   Social History   Socioeconomic History   Marital status: Significant Other    Spouse name: Not on file   Number of  children: 5   Years of education: Not on file   Highest education level: Not on file  Occupational History   Not on file  Tobacco Use   Smoking status: Every Day    Types: Cigarettes   Smokeless tobacco: Current    Types: Snuff   Tobacco comments:    3 cigarettes daily   Vaping Use   Vaping status: Never Used  Substance and Sexual Activity   Alcohol use: Not on file    Comment: Occasional   Drug use: Yes    Types: Cocaine, Marijuana    Comment: Former Cocaine user   Sexual activity: Yes  Other Topics Concern   Not on file  Social History Narrative   ** Merged History Encounter **       Social Drivers of Health   Financial Resource Strain: High Risk (05/31/2019)   Overall Financial Resource Strain (CARDIA)    Difficulty of Paying Living Expenses: Hard  Food Insecurity: Food Insecurity Present (05/31/2019)   Hunger Vital Sign    Worried About Running Out of Food in the Last Year: Sometimes true    Ran Out of Food in the Last Year: Sometimes true  Transportation Needs: Unmet Transportation Needs (05/31/2019)   PRAPARE - Administrator, Civil Service (Medical): No    Lack of Transportation (Non-Medical): Yes  Physical Activity: Not on file  Stress: Not on file  Social Connections: Not on file     Family History: The patient's family history includes Diabetes in his brother; Drug abuse in his daughter. ROS:   Please see the history of present illness.    All 14 point review of systems negative except as described per history of present  illness.  EKGs/Labs/Other Studies Reviewed:    The following studies were reviewed today:   EKG:       Recent Labs: 01/25/2023: BUN 14; Creatinine, Ser 1.34; Hemoglobin 13.8; Platelets 238; Potassium 4.5; Sodium 142  Recent Lipid Panel    Component Value Date/Time   CHOL 78 (L) 01/09/2020 1118   TRIG 112 01/09/2020 1118   HDL 28 (L) 01/09/2020 1118   CHOLHDL 2.8 01/09/2020 1118   CHOLHDL 3.5 05/05/2019 0312   VLDL 28 05/05/2019 0312   LDLCALC 29 01/09/2020 1118    Physical Exam:    VS:  BP 96/62 (BP Location: Left Arm, Patient Position: Sitting)   Pulse (!) 52   Ht 5' 11.5" (1.816 m)   Wt 145 lb 3.2 oz (65.9 kg)   SpO2 97%   BMI 19.97 kg/m     Wt Readings from Last 3 Encounters:  11/11/23 145 lb 3.2 oz (65.9 kg)  10/17/23 152 lb 12.8 oz (69.3 kg)  01/25/23 150 lb (68 kg)     GENERAL:  Well nourished, well developed in no acute distress. Poor dentition. NECK: No JVD; No carotid bruits CARDIAC: RRR, S1 and S2 present, no murmurs, no rubs, no gallops CHEST:  Clear to auscultation without rales, wheezing or rhonchi  Extremities: No pitting pedal edema. Pulses bilaterally symmetric with radial 2+ and dorsalis pedis 2+ NEUROLOGIC:  Alert and oriented x 3  Medication Adjustments/Labs and Tests Ordered: Current medicines are reviewed at length with the patient today.  Concerns regarding medicines are outlined above.  No orders of the defined types were placed in this encounter.  No orders of the defined types were placed in this encounter.   Signed, Sunset Joshi reddy Decklyn Hornik, MD, MPH, [facility]. 11/11/2023 1:56 PM  [facility]

## 2023-11-11 NOTE — Patient Instructions (Signed)
Medication Instructions:  Your physician recommends that you continue on your current medications as directed. Please refer to the Current Medication list given to you today.  *If you need a refill on your cardiac medications before your next appointment, please call your pharmacy*   Lab Work: None Ordered If you have labs (blood work) drawn today and your tests are completely normal, you will receive your results only by: MyChart Message (if you have MyChart) OR A paper copy in the mail If you have any lab test that is abnormal or we need to change your treatment, we will call you to review the results.   Testing/Procedures: None Ordered   Follow-Up: At [facility], you and your health needs are our priority.  As part of our continuing mission to provide you with exceptional heart care, we have created designated Provider Care Teams.  These Care Teams include your primary Cardiologist (physician) and Advanced Practice Providers (APPs -  Physician Assistants and Nurse Practitioners) who all work together to provide you with the care you need, when you need it.  We recommend signing up for the patient portal called "MyChart".  Sign up information is provided on this After Visit Summary.  MyChart is used to connect with patients for Virtual Visits (Telemedicine).  Patients are able to view lab/test results, encounter notes, upcoming appointments, etc.  Non-urgent messages can be sent to your provider as well.   To learn more about what you can do with MyChart, go to [email or web address].

## 2023-11-11 NOTE — Assessment & Plan Note (Signed)
Doing well. Last device check from October 17, 2023 was normal.  Perioperatively this can be managed with magnet over the device as per protocol if electrocautery is being used for the surgery.

## 2023-11-11 NOTE — Assessment & Plan Note (Signed)
No active symptoms. Functional status able to walk using his cane 3-4 trips around his house. Appears to meet more than 4 METS activity. NYHA class II.  Continue with aspirin 81 mg once daily. He is currently on holding aspirin for his upcoming surgery on February 17.  He is aware to resume it after the surgery. Continue atorvastatin 80 mg once daily. Soft blood pressures limits escalating antianginal regimen and has been on Corlanor 5 mg twice daily for years.  Tolerating well.

## 2023-11-11 NOTE — Assessment & Plan Note (Signed)
Being planned for elective cholecystectomy 22-17-2025 at [facility] with Dr. Lequita Halt No active cardiac symptoms. Stable from a cardiac standpoint with respect to his CAD, CHF and ICD history. No anginal symptoms.  Euvolemic and compensated from heart failure standpoint. Normal device functioning from ICD.  Functional status appears to have aborted 4 METS activity.  From cardiac standpoint okay to proceed with his elective surgery for a cholecystectomy as being planned. He has already held his aspirin since Tuesday of this week and anticipation for the surgery. This is reasonable.  Resume aspirin postsurgery once hemostasis is obtained and okay with the surgeon.  Perioperative ICD management as per protocol, okay to use magnet over the device if using cautery to avoid inappropriate ICD discharges.

## 2023-11-14 DIAGNOSIS — Z79899 Other long term (current) drug therapy: Secondary | ICD-10-CM | POA: Diagnosis not present

## 2023-11-14 DIAGNOSIS — K8010 Calculus of gallbladder with chronic cholecystitis without obstruction: Secondary | ICD-10-CM | POA: Diagnosis not present

## 2023-11-14 DIAGNOSIS — I509 Heart failure, unspecified: Secondary | ICD-10-CM | POA: Diagnosis not present

## 2023-11-14 DIAGNOSIS — Z85118 Personal history of other malignant neoplasm of bronchus and lung: Secondary | ICD-10-CM | POA: Diagnosis not present

## 2023-11-14 DIAGNOSIS — Z7982 Long term (current) use of aspirin: Secondary | ICD-10-CM | POA: Diagnosis not present

## 2023-11-14 DIAGNOSIS — I252 Old myocardial infarction: Secondary | ICD-10-CM | POA: Diagnosis not present

## 2023-11-14 DIAGNOSIS — F17210 Nicotine dependence, cigarettes, uncomplicated: Secondary | ICD-10-CM | POA: Diagnosis not present

## 2023-11-14 DIAGNOSIS — Z95810 Presence of automatic (implantable) cardiac defibrillator: Secondary | ICD-10-CM | POA: Diagnosis not present

## 2023-11-14 DIAGNOSIS — N1831 Chronic kidney disease, stage 3a: Secondary | ICD-10-CM | POA: Diagnosis not present

## 2023-11-14 DIAGNOSIS — J449 Chronic obstructive pulmonary disease, unspecified: Secondary | ICD-10-CM | POA: Diagnosis not present

## 2023-11-15 NOTE — Progress Notes (Signed)
 Remote ICD transmission.

## 2023-11-26 DIAGNOSIS — I110 Hypertensive heart disease with heart failure: Secondary | ICD-10-CM | POA: Diagnosis not present

## 2023-11-26 DIAGNOSIS — J449 Chronic obstructive pulmonary disease, unspecified: Secondary | ICD-10-CM | POA: Diagnosis not present

## 2023-11-26 DIAGNOSIS — N1831 Chronic kidney disease, stage 3a: Secondary | ICD-10-CM | POA: Diagnosis not present

## 2023-11-30 ENCOUNTER — Other Ambulatory Visit (HOSPITAL_COMMUNITY): Payer: Self-pay

## 2023-11-30 ENCOUNTER — Telehealth: Payer: Self-pay

## 2023-11-30 ENCOUNTER — Telehealth: Payer: Self-pay | Admitting: Pharmacy Technician

## 2023-11-30 NOTE — Telephone Encounter (Signed)
 Noted.

## 2023-11-30 NOTE — Telephone Encounter (Signed)
 Pharmacy Patient Advocate Encounter  Received notification from [facility] that Prior Authorization for Ivabradine HCl 5MG  tablets has been APPROVED from 11/30/23 to 09/26/24. Spoke to pharmacy to process.Copay is $0.00.    PA #/Case ID/Reference #:  [id number]

## 2023-11-30 NOTE — Telephone Encounter (Signed)
 Pharmacy Patient Advocate Encounter   Received notification from Pt Calls Messages that prior authorization for Ivabradine 5mg  is required/requested.   Insurance verification completed.   The patient is insured through [place] .   Per test claim: PA required; PA submitted to above mentioned insurance via CoverMyMeds Key/confirmation #/EOC [id number] Status is pending

## 2023-12-10 DIAGNOSIS — J449 Chronic obstructive pulmonary disease, unspecified: Secondary | ICD-10-CM | POA: Diagnosis not present

## 2023-12-15 DIAGNOSIS — M545 Low back pain, unspecified: Secondary | ICD-10-CM | POA: Diagnosis not present

## 2023-12-15 DIAGNOSIS — M4186 Other forms of scoliosis, lumbar region: Secondary | ICD-10-CM | POA: Diagnosis not present

## 2023-12-15 DIAGNOSIS — M51369 Other intervertebral disc degeneration, lumbar region without mention of lumbar back pain or lower extremity pain: Secondary | ICD-10-CM | POA: Diagnosis not present

## 2023-12-15 DIAGNOSIS — M5416 Radiculopathy, lumbar region: Secondary | ICD-10-CM | POA: Diagnosis not present

## 2023-12-20 DIAGNOSIS — Z23 Encounter for immunization: Secondary | ICD-10-CM | POA: Diagnosis not present

## 2023-12-22 DIAGNOSIS — Z6821 Body mass index (BMI) 21.0-21.9, adult: Secondary | ICD-10-CM | POA: Diagnosis not present

## 2023-12-22 DIAGNOSIS — M545 Low back pain, unspecified: Secondary | ICD-10-CM | POA: Diagnosis not present

## 2023-12-27 DIAGNOSIS — J449 Chronic obstructive pulmonary disease, unspecified: Secondary | ICD-10-CM | POA: Diagnosis not present

## 2024-01-05 ENCOUNTER — Ambulatory Visit (INDEPENDENT_AMBULATORY_CARE_PROVIDER_SITE_OTHER): Payer: 59

## 2024-01-05 DIAGNOSIS — I428 Other cardiomyopathies: Secondary | ICD-10-CM

## 2024-01-05 LAB — CUP PACEART REMOTE DEVICE CHECK
Battery Remaining Longevity: 84 mo
Battery Remaining Percentage: 69 %
Battery Voltage: 2.98 V
Brady Statistic RV Percent Paced: 1 %
Date Time Interrogation Session: 20250409220659
HighPow Impedance: 64 Ohm
Implantable Lead Connection Status: 753985
Implantable Lead Implant Date: 20201214
Implantable Lead Location: 753860
Implantable Pulse Generator Implant Date: 20201214
Lead Channel Impedance Value: 600 Ohm
Lead Channel Pacing Threshold Amplitude: 0.75 V
Lead Channel Pacing Threshold Pulse Width: 0.5 ms
Lead Channel Sensing Intrinsic Amplitude: 12 mV
Lead Channel Setting Pacing Amplitude: 2.5 V
Lead Channel Setting Pacing Pulse Width: 0.5 ms
Lead Channel Setting Sensing Sensitivity: 0.5 mV
Pulse Gen Serial Number: 111013076
Zone Setting Status: 755011

## 2024-01-10 DIAGNOSIS — J449 Chronic obstructive pulmonary disease, unspecified: Secondary | ICD-10-CM | POA: Diagnosis not present

## 2024-01-18 ENCOUNTER — Other Ambulatory Visit: Payer: Self-pay | Admitting: Cardiology

## 2024-01-18 DIAGNOSIS — I5042 Chronic combined systolic (congestive) and diastolic (congestive) heart failure: Secondary | ICD-10-CM

## 2024-01-18 NOTE — Telephone Encounter (Signed)
 Prescription sent to pharmacy.

## 2024-01-23 ENCOUNTER — Emergency Department (HOSPITAL_COMMUNITY)

## 2024-01-23 ENCOUNTER — Observation Stay (HOSPITAL_COMMUNITY)

## 2024-01-23 ENCOUNTER — Encounter (HOSPITAL_COMMUNITY): Payer: Self-pay

## 2024-01-23 ENCOUNTER — Other Ambulatory Visit: Payer: Self-pay

## 2024-01-23 ENCOUNTER — Inpatient Hospital Stay (HOSPITAL_COMMUNITY)
Admission: EM | Admit: 2024-01-23 | Discharge: 2024-01-26 | DRG: 176 | Disposition: A | Discharge status: Home Health | Attending: Internal Medicine | Admitting: Internal Medicine

## 2024-01-23 DIAGNOSIS — I5022 Chronic systolic (congestive) heart failure: Secondary | ICD-10-CM | POA: Diagnosis present

## 2024-01-23 DIAGNOSIS — J9811 Atelectasis: Secondary | ICD-10-CM | POA: Diagnosis not present

## 2024-01-23 DIAGNOSIS — R9389 Abnormal findings on diagnostic imaging of other specified body structures: Secondary | ICD-10-CM | POA: Diagnosis not present

## 2024-01-23 DIAGNOSIS — R079 Chest pain, unspecified: Secondary | ICD-10-CM

## 2024-01-23 DIAGNOSIS — I2510 Atherosclerotic heart disease of native coronary artery without angina pectoris: Secondary | ICD-10-CM | POA: Diagnosis present

## 2024-01-23 DIAGNOSIS — I2694 Multiple subsegmental pulmonary emboli without acute cor pulmonale: Secondary | ICD-10-CM | POA: Diagnosis not present

## 2024-01-23 DIAGNOSIS — M419 Scoliosis, unspecified: Secondary | ICD-10-CM | POA: Diagnosis not present

## 2024-01-23 DIAGNOSIS — I253 Aneurysm of heart: Secondary | ICD-10-CM | POA: Diagnosis present

## 2024-01-23 DIAGNOSIS — I252 Old myocardial infarction: Secondary | ICD-10-CM

## 2024-01-23 DIAGNOSIS — J984 Other disorders of lung: Secondary | ICD-10-CM | POA: Diagnosis not present

## 2024-01-23 DIAGNOSIS — R0789 Other chest pain: Secondary | ICD-10-CM | POA: Diagnosis not present

## 2024-01-23 DIAGNOSIS — E861 Hypovolemia: Secondary | ICD-10-CM

## 2024-01-23 DIAGNOSIS — N1831 Chronic kidney disease, stage 3a: Secondary | ICD-10-CM | POA: Diagnosis not present

## 2024-01-23 DIAGNOSIS — R042 Hemoptysis: Secondary | ICD-10-CM | POA: Diagnosis not present

## 2024-01-23 DIAGNOSIS — Z79899 Other long term (current) drug therapy: Secondary | ICD-10-CM

## 2024-01-23 DIAGNOSIS — I493 Ventricular premature depolarization: Secondary | ICD-10-CM | POA: Diagnosis not present

## 2024-01-23 DIAGNOSIS — B159 Hepatitis A without hepatic coma: Secondary | ICD-10-CM | POA: Diagnosis present

## 2024-01-23 DIAGNOSIS — E785 Hyperlipidemia, unspecified: Secondary | ICD-10-CM | POA: Diagnosis not present

## 2024-01-23 DIAGNOSIS — Z7982 Long term (current) use of aspirin: Secondary | ICD-10-CM

## 2024-01-23 DIAGNOSIS — I130 Hypertensive heart and chronic kidney disease with heart failure and stage 1 through stage 4 chronic kidney disease, or unspecified chronic kidney disease: Secondary | ICD-10-CM | POA: Diagnosis present

## 2024-01-23 DIAGNOSIS — I959 Hypotension, unspecified: Secondary | ICD-10-CM | POA: Diagnosis not present

## 2024-01-23 DIAGNOSIS — N2882 Megaloureter: Secondary | ICD-10-CM | POA: Diagnosis not present

## 2024-01-23 DIAGNOSIS — M069 Rheumatoid arthritis, unspecified: Secondary | ICD-10-CM | POA: Diagnosis present

## 2024-01-23 DIAGNOSIS — F1010 Alcohol abuse, uncomplicated: Secondary | ICD-10-CM | POA: Diagnosis present

## 2024-01-23 DIAGNOSIS — J986 Disorders of diaphragm: Secondary | ICD-10-CM | POA: Diagnosis not present

## 2024-01-23 DIAGNOSIS — I9589 Other hypotension: Secondary | ICD-10-CM | POA: Diagnosis not present

## 2024-01-23 DIAGNOSIS — K219 Gastro-esophageal reflux disease without esophagitis: Secondary | ICD-10-CM | POA: Diagnosis present

## 2024-01-23 DIAGNOSIS — J449 Chronic obstructive pulmonary disease, unspecified: Secondary | ICD-10-CM | POA: Diagnosis present

## 2024-01-23 DIAGNOSIS — I69398 Other sequelae of cerebral infarction: Secondary | ICD-10-CM

## 2024-01-23 DIAGNOSIS — F17210 Nicotine dependence, cigarettes, uncomplicated: Secondary | ICD-10-CM | POA: Diagnosis present

## 2024-01-23 DIAGNOSIS — I428 Other cardiomyopathies: Secondary | ICD-10-CM | POA: Diagnosis not present

## 2024-01-23 DIAGNOSIS — D509 Iron deficiency anemia, unspecified: Secondary | ICD-10-CM | POA: Diagnosis not present

## 2024-01-23 DIAGNOSIS — R918 Other nonspecific abnormal finding of lung field: Secondary | ICD-10-CM | POA: Diagnosis not present

## 2024-01-23 DIAGNOSIS — R0902 Hypoxemia: Secondary | ICD-10-CM | POA: Diagnosis not present

## 2024-01-23 DIAGNOSIS — Z95810 Presence of automatic (implantable) cardiac defibrillator: Secondary | ICD-10-CM

## 2024-01-23 DIAGNOSIS — R001 Bradycardia, unspecified: Secondary | ICD-10-CM | POA: Diagnosis not present

## 2024-01-23 DIAGNOSIS — I2699 Other pulmonary embolism without acute cor pulmonale: Secondary | ICD-10-CM | POA: Diagnosis not present

## 2024-01-23 DIAGNOSIS — N132 Hydronephrosis with renal and ureteral calculous obstruction: Secondary | ICD-10-CM | POA: Diagnosis not present

## 2024-01-23 DIAGNOSIS — R000 Tachycardia, unspecified: Secondary | ICD-10-CM | POA: Diagnosis not present

## 2024-01-23 DIAGNOSIS — R569 Unspecified convulsions: Secondary | ICD-10-CM | POA: Diagnosis not present

## 2024-01-23 DIAGNOSIS — Z91041 Radiographic dye allergy status: Secondary | ICD-10-CM

## 2024-01-23 DIAGNOSIS — Z7952 Long term (current) use of systemic steroids: Secondary | ICD-10-CM

## 2024-01-23 DIAGNOSIS — R0602 Shortness of breath: Secondary | ICD-10-CM | POA: Diagnosis not present

## 2024-01-23 DIAGNOSIS — F39 Unspecified mood [affective] disorder: Secondary | ICD-10-CM | POA: Diagnosis present

## 2024-01-23 LAB — VALPROIC ACID LEVEL: Valproic Acid Lvl: <10 ug/mL — ABNORMAL LOW (ref 50–100)

## 2024-01-23 LAB — COMPREHENSIVE METABOLIC PANEL WITH GFR
ALT: 167 U/L — ABNORMAL HIGH (ref 0–44)
ALT: 150 U/L — ABNORMAL HIGH (ref 0–44)
AST: 203 U/L — ABNORMAL HIGH (ref 15–41)
AST: 130 U/L — ABNORMAL HIGH (ref 15–41)
Albumin: 4.4 g/dL (ref 3.5–5.0)
Albumin: 3.4 g/dL — ABNORMAL LOW (ref 3.5–5.0)
Alkaline Phosphatase: 222 U/L — ABNORMAL HIGH (ref 38–126)
Alkaline Phosphatase: 179 U/L — ABNORMAL HIGH (ref 38–126)
Anion gap: 16 — ABNORMAL HIGH (ref 5–15)
Anion gap: 9 (ref 5–15)
BUN: 13 mg/dL (ref 6–20)
BUN: 13 mg/dL (ref 6–20)
CO2: 19 mmol/L — ABNORMAL LOW (ref 22–32)
CO2: 24 mmol/L (ref 22–32)
Calcium: 9.9 mg/dL (ref 8.9–10.3)
Calcium: 8.8 mg/dL — ABNORMAL LOW (ref 8.9–10.3)
Chloride: 102 mmol/L (ref 98–111)
Chloride: 105 mmol/L (ref 98–111)
Creatinine, Ser: 1.71 mg/dL — ABNORMAL HIGH (ref 0.61–1.24)
Creatinine, Ser: 1.52 mg/dL — ABNORMAL HIGH (ref 0.61–1.24)
GFR, Estimated: 46 mL/min — ABNORMAL LOW (ref >60)
GFR, Estimated: 53 mL/min — ABNORMAL LOW (ref >60)
Glucose, Bld: 101 mg/dL — ABNORMAL HIGH (ref 70–99)
Glucose, Bld: 121 mg/dL — ABNORMAL HIGH (ref 70–99)
Potassium: 4.5 mmol/L (ref 3.5–5.1)
Potassium: 3.9 mmol/L (ref 3.5–5.1)
Sodium: 137 mmol/L (ref 135–145)
Sodium: 138 mmol/L (ref 135–145)
Total Bilirubin: 1.4 mg/dL — ABNORMAL HIGH (ref 0.0–1.2)
Total Bilirubin: 0.8 mg/dL (ref 0.0–1.2)
Total Protein: 8.3 g/dL — ABNORMAL HIGH (ref 6.5–8.1)
Total Protein: 6.7 g/dL (ref 6.5–8.1)

## 2024-01-23 LAB — CBC WITH DIFFERENTIAL/PLATELET
Abs Immature Granulocytes: 0.1 10*3/uL — ABNORMAL HIGH (ref 0.00–0.07)
Basophils Absolute: 0.1 10*3/uL (ref 0.0–0.1)
Basophils Relative: 1 %
Eosinophils Absolute: 0.1 10*3/uL (ref 0.0–0.5)
Eosinophils Relative: 1 %
HCT: 54.5 % — ABNORMAL HIGH (ref 39.0–52.0)
Hemoglobin: 17.1 g/dL — ABNORMAL HIGH (ref 13.0–17.0)
Immature Granulocytes: 1 %
Lymphocytes Relative: 14 %
Lymphs Abs: 1.3 10*3/uL (ref 0.7–4.0)
MCH: 28.2 pg (ref 26.0–34.0)
MCHC: 31.4 g/dL (ref 30.0–36.0)
MCV: 89.9 fL (ref 80.0–100.0)
Monocytes Absolute: 1 10*3/uL (ref 0.1–1.0)
Monocytes Relative: 11 %
Neutro Abs: 6.4 10*3/uL (ref 1.7–7.7)
Neutrophils Relative %: 72 %
Platelets: 208 10*3/uL (ref 150–400)
RBC: 6.06 MIL/uL — ABNORMAL HIGH (ref 4.22–5.81)
RDW: 14 % (ref 11.5–15.5)
WBC: 8.9 10*3/uL (ref 4.0–10.5)
nRBC: 0 % (ref 0.0–0.2)

## 2024-01-23 LAB — TROPONIN I (HIGH SENSITIVITY)
Troponin I (High Sensitivity): 16 ng/L (ref <18)
Troponin I (High Sensitivity): 15 ng/L (ref <18)
Troponin I (High Sensitivity): 16 ng/L (ref <18)

## 2024-01-23 LAB — D-DIMER, QUANTITATIVE: D-Dimer, Quant: 16.66 ug{FEU}/mL — ABNORMAL HIGH (ref 0.00–0.50)

## 2024-01-23 LAB — MAGNESIUM: Magnesium: 1.6 mg/dL — ABNORMAL LOW (ref 1.7–2.4)

## 2024-01-23 MED ORDER — ALBUTEROL SULFATE (2.5 MG/3ML) 0.083% IN NEBU: 3.0000 mL | INHALATION_SOLUTION | Freq: Four times a day (QID) | RESPIRATORY_TRACT | Status: DC | PRN

## 2024-01-23 MED ORDER — IPRATROPIUM-ALBUTEROL 0.5-2.5 (3) MG/3ML IN SOLN: 3.0000 mL | Freq: Four times a day (QID) | RESPIRATORY_TRACT | Status: DC | PRN

## 2024-01-23 MED ORDER — EZETIMIBE 10 MG PO TABS: 10.0000 mg | ORAL_TABLET | Freq: Every day | ORAL | Status: DC

## 2024-01-23 MED ORDER — PANTOPRAZOLE SODIUM 40 MG PO TBEC
40.0000 mg | DELAYED_RELEASE_TABLET | Freq: Every day | ORAL | Status: DC
  Administered 2024-01-24 – 2024-01-26 (×3): 40 mg via ORAL
  Filled 2024-01-23 (×3): qty 1

## 2024-01-23 MED ORDER — ACETAMINOPHEN 325 MG PO TABS
650.0000 mg | ORAL_TABLET | Freq: Four times a day (QID) | ORAL | Status: DC | PRN
  Administered 2024-01-24 (×3): 650 mg via ORAL
  Filled 2024-01-23 (×4): qty 2

## 2024-01-23 MED ORDER — MAGNESIUM SULFATE 4 GM/100ML IV SOLN
4.0000 g | Freq: Once | INTRAVENOUS | Status: AC
  Administered 2024-01-24: 4 g via INTRAVENOUS
  Filled 2024-01-23: qty 100

## 2024-01-23 MED ORDER — DIVALPROEX SODIUM 250 MG PO DR TAB
500.0000 mg | DELAYED_RELEASE_TABLET | Freq: Every day | ORAL | Status: DC
  Administered 2024-01-23 – 2024-01-25 (×3): 500 mg via ORAL
  Filled 2024-01-23: qty 1
  Filled 2024-01-23 (×2): qty 2

## 2024-01-23 MED ORDER — SERTRALINE HCL 100 MG PO TABS
150.0000 mg | ORAL_TABLET | Freq: Every day | ORAL | Status: DC
  Administered 2024-01-24 – 2024-01-26 (×3): 150 mg via ORAL
  Filled 2024-01-23 (×3): qty 1

## 2024-01-23 MED ORDER — ACETAMINOPHEN 650 MG RE SUPP: 650.0000 mg | Freq: Four times a day (QID) | RECTAL | Status: DC | PRN

## 2024-01-23 MED ORDER — ENOXAPARIN SODIUM 60 MG/0.6ML IJ SOSY
60.0000 mg | PREFILLED_SYRINGE | Freq: Two times a day (BID) | INTRAMUSCULAR | Status: DC
  Administered 2024-01-23 – 2024-01-25 (×4): 60 mg via SUBCUTANEOUS
  Filled 2024-01-23 (×4): qty 0.6

## 2024-01-23 MED ORDER — DIVALPROEX SODIUM 250 MG PO DR TAB
250.0000 mg | DELAYED_RELEASE_TABLET | Freq: Every morning | ORAL | Status: DC
  Administered 2024-01-24 – 2024-01-26 (×3): 250 mg via ORAL
  Filled 2024-01-23 (×4): qty 1

## 2024-01-23 MED ORDER — BUDESON-GLYCOPYRROL-FORMOTEROL 160-9-4.8 MCG/ACT IN AERO
2.0000 | INHALATION_SPRAY | Freq: Two times a day (BID) | RESPIRATORY_TRACT | Status: DC
  Administered 2024-01-25 – 2024-01-26 (×3): 2 via RESPIRATORY_TRACT
  Filled 2024-01-23 (×2): qty 5.9

## 2024-01-23 MED ORDER — ONDANSETRON HCL 4 MG/2ML IJ SOLN
4.0000 mg | Freq: Once | INTRAMUSCULAR | Status: AC
Start: 2024-01-23 — End: 2024-01-23
  Administered 2024-01-23: 4 mg via INTRAVENOUS
  Filled 2024-01-23: qty 2

## 2024-01-23 MED ORDER — ATORVASTATIN CALCIUM 80 MG PO TABS
80.0000 mg | ORAL_TABLET | Freq: Every day | ORAL | Status: DC
  Administered 2024-01-23: 80 mg via ORAL
  Filled 2024-01-23: qty 1

## 2024-01-23 MED ORDER — GABAPENTIN 300 MG PO CAPS
600.0000 mg | ORAL_CAPSULE | Freq: Three times a day (TID) | ORAL | Status: DC
  Administered 2024-01-23 – 2024-01-26 (×9): 600 mg via ORAL
  Filled 2024-01-23 (×9): qty 2

## 2024-01-23 MED ORDER — ONDANSETRON 4 MG PO TBDP
4.0000 mg | ORAL_TABLET | Freq: Three times a day (TID) | ORAL | Status: DC | PRN
  Administered 2024-01-23 – 2024-01-24 (×2): 4 mg via ORAL
  Filled 2024-01-23 (×3): qty 1

## 2024-01-23 MED ORDER — HEPARIN BOLUS VIA INFUSION
4000.0000 [IU] | Freq: Once | INTRAVENOUS | Status: DC
  Filled 2024-01-23: qty 4000

## 2024-01-23 MED ORDER — TECHNETIUM TO 99M ALBUMIN AGGREGATED
3.9000 | Freq: Once | INTRAVENOUS | Status: AC | PRN
  Administered 2024-01-23: 3.9 via INTRAVENOUS

## 2024-01-23 MED ORDER — THIAMINE MONONITRATE 100 MG PO TABS
100.0000 mg | ORAL_TABLET | Freq: Every day | ORAL | Status: DC
  Administered 2024-01-23 – 2024-01-26 (×4): 100 mg via ORAL
  Filled 2024-01-23 (×4): qty 1

## 2024-01-23 MED ORDER — IVABRADINE HCL 5 MG PO TABS
5.0000 mg | ORAL_TABLET | Freq: Two times a day (BID) | ORAL | Status: DC
  Filled 2024-01-23 (×3): qty 1

## 2024-01-23 MED ORDER — HEPARIN (PORCINE) 25000 UT/250ML-% IV SOLN: 1000.0000 [IU]/h | INTRAVENOUS | Status: DC

## 2024-01-23 MED ORDER — ADULT MULTIVITAMIN W/MINERALS CH
1.0000 | ORAL_TABLET | Freq: Every day | ORAL | Status: DC
  Administered 2024-01-23 – 2024-01-26 (×4): 1 via ORAL
  Filled 2024-01-23 (×4): qty 1

## 2024-01-23 MED ORDER — MIRTAZAPINE 15 MG PO TABS: 7.5000 mg | ORAL_TABLET | Freq: Every day | ORAL | Status: DC

## 2024-01-23 MED ORDER — MORPHINE SULFATE (PF) 2 MG/ML IV SOLN: 2.0000 mg | Freq: Once | INTRAVENOUS | Status: DC

## 2024-01-23 MED ORDER — METOPROLOL SUCCINATE ER 50 MG PO TB24
50.0000 mg | ORAL_TABLET | Freq: Two times a day (BID) | ORAL | Status: DC
  Filled 2024-01-23: qty 1
  Filled 2024-01-23: qty 2

## 2024-01-23 MED ORDER — SODIUM CHLORIDE 0.9 % IV BOLUS
500.0000 mL | Freq: Once | INTRAVENOUS | Status: AC
  Administered 2024-01-23: 500 mL via INTRAVENOUS

## 2024-01-23 MED ORDER — SERTRALINE HCL 100 MG PO TABS
100.0000 mg | ORAL_TABLET | Freq: Every day | ORAL | Status: DC
  Administered 2024-01-23: 100 mg via ORAL
  Filled 2024-01-23: qty 1

## 2024-01-23 MED ORDER — TECHNETIUM TO 99M ALBUMIN AGGREGATED: 3.9000 | Freq: Once | INTRAVENOUS | Status: DC | PRN

## 2024-01-23 MED ORDER — FOLIC ACID 1 MG PO TABS
1.0000 mg | ORAL_TABLET | Freq: Every day | ORAL | Status: DC
  Administered 2024-01-23 – 2024-01-26 (×4): 1 mg via ORAL
  Filled 2024-01-23 (×4): qty 1

## 2024-01-23 MED ORDER — ASPIRIN 81 MG PO TBEC
81.0000 mg | DELAYED_RELEASE_TABLET | Freq: Every day | ORAL | Status: DC
  Administered 2024-01-24 – 2024-01-26 (×3): 81 mg via ORAL
  Filled 2024-01-23 (×3): qty 1

## 2024-01-23 MED ORDER — SODIUM CHLORIDE 0.9 % IV BOLUS: 1000.0000 mL | Freq: Once | INTRAVENOUS | Status: DC

## 2024-01-23 MED ORDER — ONDANSETRON 4 MG PO TBDP: 4.0000 mg | ORAL_TABLET | Freq: Once | ORAL | Status: DC

## 2024-01-23 MED ORDER — NITROGLYCERIN 0.4 MG SL SUBL: 0.4000 mg | SUBLINGUAL_TABLET | SUBLINGUAL | Status: DC | PRN

## 2024-01-23 NOTE — ED Provider Notes (Cosign Needed Addendum)
 [place] EMERGENCY DEPARTMENT AT [facility] Provider Note   CSN: [id number] Arrival date & time: 01/23/24  1203     History  Chief Complaint  Patient presents with   Chest Pain    Jimmy Nelson. is a 58 y.o. male, history of 2 heart attacks, CVA, CHF, who presents to the ED secondary to chest pain, last around 10:45 AM today.  He states that it was associated with severe nausea, and aching in the middle of his chest, with associated shortness of breath.  He states the pain has come and gone since then, with intermittent shortness of breath as well.  Took 4 aspirin , in the ED by EMS, but has not taken any home nitroglycerin .  He states he continues to have pain, denies any abdominal pain, or recent alcohol use.  Did have a surgery about 2 months ago, and is not on any blood thinners.  Is on home O22.5 Liters all the time, and has not had to increase amount.    Home Medications Prior to Admission medications   Medication Sig Start Date End Date Taking? Authorizing Provider  acetaminophen  (TYLENOL ) 325 MG tablet Take 650 mg by mouth every 6 (six) hours as needed for moderate pain or headache.    [provider]  Albuterol  Sulfate (PROAIR  RESPICLICK) 108 (90 Base) MCG/ACT AEPB Inhale 2 puffs into the lungs every 6 (six) hours as needed (shortness of breath and wheezing).    [provider]  aspirin  EC 81 MG tablet Take 81 mg by mouth daily. Swallow whole.    [provider]  atorvastatin  (LIPITOR ) 80 MG tablet Take 1 tablet (80 mg total) by mouth daily. 04/05/23   Terrance Ferretti, NP  Budeson-Glycopyrrol-Formoterol  (BREZTRI  AEROSPHERE) 160-9-4.8 MCG/ACT AERO Inhale 2 puffs into the lungs 2 (two) times daily.    [provider]  Coenzyme Q10 200 MG capsule Take 200 mg by mouth daily.    [provider]  Cyanocobalamin (B-12) 2500 MCG TABS Take 2,500 mcg by mouth daily.    [provider]  divalproex  (DEPAKOTE ) 250 MG DR  tablet Take 250 mg by mouth in the morning. And 500 mg at night    [provider]  docusate sodium (COLACE) 100 MG capsule Take 100 mg by mouth 2 (two) times daily.    [provider]  EPINEPHrine 0.3 mg/0.3 mL IJ SOAJ injection Inject 0.3 mg into the muscle as needed for anaphylaxis.    [provider]  esomeprazole  (NEXIUM ) 40 MG capsule Take 40 mg by mouth daily. 12/09/20   [provider]  ezetimibe  (ZETIA ) 10 MG tablet Take 1 tablet (10 mg total) by mouth daily. 01/25/23 01/23/24  Terrance Ferretti, NP  ferrous sulfate  325 (65 FE) MG tablet Take 1 tablet (325 mg total) by mouth daily with breakfast. 01/01/13   Lissa Riding, NP  gabapentin  (NEURONTIN ) 600 MG tablet Take 600 mg by mouth 3 (three) times daily. 05/02/19   [provider]  ibuprofen (ADVIL) 200 MG tablet Take 800 mg by mouth every 8 (eight) hours as needed for headache or moderate pain.    [provider]  ipratropium-albuterol  (DUONEB) 0.5-2.5 (3) MG/3ML SOLN Inhale 3 mLs into the lungs every 6 (six) hours as needed (shortness of breath).  05/02/19   [provider]  ivabradine  (CORLANOR ) 5 MG TABS tablet Take 1 tablet (5 mg total) by mouth 2 (two) times daily with a meal. TAKE 1 TABLET BY  MOUTH TWICE(2) DAILY WITH MEALS 05/03/23   Revankar, Micael Adas, MD  magnesium  oxide (MAG-OX) 400 MG tablet Take 2 tablets by mouth 2 (two) times daily. 12/02/22   [provider]  metoprolol  succinate (TOPROL -XL) 50 MG 24 hr tablet Take 1 tablet (50 mg total) by mouth 2 (two) times daily. Take with or immediately following a meal. 05/03/23 08/01/23  Revankar, Micael Adas, MD  mirtazapine  (REMERON ) 7.5 MG tablet Take 7.5 mg by mouth at bedtime.    [provider]  montelukast (SINGULAIR) 10 MG tablet Take 10 mg by mouth daily. 05/02/19   [provider]  Multiple Vitamins-Minerals (MULTIVITAMIN WITH MINERALS) tablet Take 1 tablet by mouth daily. 01/01/13   Lissa Riding, NP   nicotine  (NICODERM CQ  - DOSED IN MG/24 HOURS) 21 mg/24hr patch Place 21 mg onto the skin daily.    [provider]  nitroGLYCERIN  (NITROSTAT ) 0.4 MG SL tablet DISSOLVE 1 TABLET UNDER THE TONGUE EVERY 5 MINUTES AS NEEDED FOR CHEST PAIN. (MAXIMUM OF 3 DOSES) 04/05/22   Krasowski, Robert J, MD  predniSONE  (DELTASONE ) 10 MG tablet Take 10 mg by mouth daily with breakfast. 10/18/22   [provider]  pyridOXINE (VITAMIN B-6) 100 MG tablet Take 100 mg by mouth daily.    [provider]  sacubitril-valsartan (ENTRESTO ) 49-51 MG TAKE ONE TABLET BY MOUTH TWICE DAILY 01/18/24   Revankar, Rajan R, MD  sertraline  (ZOLOFT ) 100 MG tablet Take 100 mg by mouth daily. 12/09/20   [provider]      Allergies    Bee venom, Iodinated contrast media, and Chantix [varenicline tartrate]    Review of Systems   Review of Systems  Constitutional:  Negative for fever.  Respiratory:  Positive for shortness of breath.   Cardiovascular:  Positive for chest pain.  Gastrointestinal:  Positive for nausea. Negative for abdominal pain.    Physical Exam Updated Vital Signs BP 94/76   Pulse 64   Temp 98.6 F (37 C) (Oral)   Resp (!) 22   Ht 5\' 11"  (1.803 m)   Wt 65.8 kg   SpO2 100%   BMI 20.22 kg/m  Physical Exam Vitals and nursing note reviewed.  Constitutional:      General: He is not in acute distress.    Appearance: He is well-developed.  HENT:     Head: Normocephalic and atraumatic.  Eyes:     Conjunctiva/sclera: Conjunctivae normal.  Cardiovascular:     Rate and Rhythm: Regular rhythm. Tachycardia present.     Heart sounds: No murmur heard. Pulmonary:     Effort: Pulmonary effort is normal. No respiratory distress.     Breath sounds: Normal breath sounds.     Comments: 2.5 L O2 Abdominal:     Palpations: Abdomen is soft.     Tenderness: There is no abdominal tenderness.  Musculoskeletal:        General: No swelling.     Cervical back: Neck supple.  Skin:     General: Skin is warm and dry.     Capillary Refill: Capillary refill takes less than 2 seconds.  Neurological:     Mental Status: He is alert.  Psychiatric:        Mood and Affect: Mood normal.     ED Results / Procedures / Treatments   Labs (all labs ordered are listed, but only abnormal results are displayed) Labs Reviewed  COMPREHENSIVE METABOLIC PANEL WITH GFR - Abnormal; Notable for the following components:  Result Value   CO2 19 (*)    Glucose, Bld 101 (*)    Creatinine, Ser 1.71 (*)    Total Protein 8.3 (*)    AST 203 (*)    ALT 167 (*)    Alkaline Phosphatase 222 (*)    Total Bilirubin 1.4 (*)    GFR, Estimated 46 (*)    Anion gap 16 (*)    All other components within normal limits  D-DIMER, QUANTITATIVE (NOT AT [facility]) - Abnormal; Notable for the following components:   D-Dimer, Quant 16.66 (*)    All other components within normal limits  CBC WITH DIFFERENTIAL/PLATELET - Abnormal; Notable for the following components:   RBC 6.06 (*)    Hemoglobin 17.1 (*)    HCT 54.5 (*)    Abs Immature Granulocytes 0.10 (*)    All other components within normal limits  CBC WITH DIFFERENTIAL/PLATELET  CBC  HIV ANTIBODY (ROUTINE TESTING W REFLEX)  BASIC METABOLIC PANEL WITH GFR  TROPONIN I (HIGH SENSITIVITY)  TROPONIN I (HIGH SENSITIVITY)    EKG EKG Interpretation Date/Time:  Monday January 23 2024 12:11:40 EDT Ventricular Rate:  118 PR Interval:  185 QRS Duration:  94 QT Interval:  317 QTC Calculation: 445 R Axis:   46  Text Interpretation: Sinus tachycardia Anteroseptal infarct, age indeterminate Baseline wander in lead(s) V2 when comapred to prior, faster rate No STEMI Confirmed by Wynell Heath ([id number]) on 01/23/2024 12:19:28 PM  Radiology NM Pulmonary Perfusion Result Date: 01/23/2024 CLINICAL DATA:  Pulmonary embolus suspected with high probability. Shortness of breath. EXAM: NUCLEAR MEDICINE PERFUSION LUNG SCAN TECHNIQUE: Perfusion images were obtained in  multiple projections after intravenous injection of radiopharmaceutical. Ventilation scans intentionally deferred if perfusion scan and chest x-ray adequate for interpretation during COVID 19 epidemic. RADIOPHARMACEUTICALS:  3.9 mCi Tc-83m MAA IV COMPARISON:  Chest radiograph 01/23/2024.  CT chest 10/20/2023 FINDINGS: Pulmonary perfusion images demonstrate a large defect at the left lung base corresponding to chest radiographic abnormalities including consolidation and volume loss. There is a segmental defect in the posterior segment of the right upper lung and a segmental defect in the anterior segment of the right upper lung. Additional subsegmental defect in the left upper lung. Some of this could be accounted for by severe emphysematous changes are noted in the lungs on chest radiograph. However, the finding of 2 or more segmental defects in the setting of high clinical probability is suggestive of high probability of pulmonary embolus. IMPRESSION: Two or greater segmental perfusion defects in the lungs consistent with high probability of pulmonary embolus. These results were called by telephone at the time of interpretation on 01/23/2024 at 4:08 pm to provider Lorraine Cimmino , who verbally acknowledged these results. Electronically Signed   By: Boyce Byes M.D.   On: 01/23/2024 16:13   DG Chest 2 View Result Date: 01/23/2024 CLINICAL DATA:  Chest pain. EXAM: CHEST - 2 VIEW COMPARISON:  Chest CT 10/20/2023 FINDINGS: The cardiac silhouette, mediastinal and hilar contours are within normal limits and stable. Right ventricular pacer wire in good position. 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 Significant underlying emphysematous changes and pulmonary scarring but no definite acute superimposed pulmonary process. IMPRESSION: 1. 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 2. Significant  underlying emphysematous changes and pulmonary scarring but no definite acute superimposed pulmonary process. Electronically Signed   By: Marrian Siva M.D.   On: 01/23/2024 15:10    Procedures  Procedures    Medications Ordered in ED Medications  technetium albumin  aggregated (MAA) injection solution 3.9 millicurie (has no administration in time range)  morphine  (PF) 2 MG/ML injection 2 mg (has no administration in time range)  enoxaparin  (LOVENOX ) injection 60 mg (has no administration in time range)  acetaminophen  (TYLENOL ) tablet 650 mg (has no administration in time range)    Or  acetaminophen  (TYLENOL ) suppository 650 mg (has no administration in time range)  ondansetron  (ZOFRAN ) injection 4 mg (4 mg Intravenous Given 01/23/24 1337)  sodium chloride  0.9 % bolus 500 mL (0 mLs Intravenous Stopped 01/23/24 1453)  technetium albumin  aggregated (MAA) injection solution 3.9 millicurie (3.9 millicuries Intravenous Contrast Given 01/23/24 1517)    ED Course/ Medical Decision Making/ A&P                                 Medical Decision Making Patient is a 58 year old male, here for chest pain, shortness of breath, that began around 10:45 AM today, with associated diaphoresis and nausea, he is tachycardic on exam, complaining of chest pain and shortness of breath.  Chest pain is achy in sensation.  Denies any recent injuries, or trauma.  He is overall well-appearing, except for chronically ill-appearing.  No acute distress, other than the tachycardia will obtain D-dimer, troponin, chest x-ray, and CBC and CMP for further evaluation.  Has already received 324 of aspirin .  Amount and/or Complexity of Data Reviewed Labs: ordered.    Details: Blood work fairly unremarkable except for D-dimer of 16+ Radiology: ordered.    Details: VQ scan, shows 2 segmental PEs, and severe emphysematous changes Discussion of management or test interpretation with external provider(s): Patient had 2 segmental PEs,  and complaining of chest pain, persistently tachycardic, on his typical 2 L of O2, unprovoked.  Admitted to internal medicine, Dr. Lelia Putnam, for further management, placed orders for heparin .  Risk Prescription drug management. Decision regarding hospitalization.   CRITICAL CARE Performed by: Timmy Forbes   Total critical care time: 35 minutes  Critical care time was exclusive of separately billable procedures and treating other patients.  Critical care was necessary to treat or prevent imminent or life-threatening deterioration.  Critical care was time spent personally by me on the following activities: development of treatment plan with patient and/or surrogate as well as nursing, discussions with consultants, evaluation of patient's response to treatment, examination of patient, obtaining history from patient or surrogate, ordering and performing treatments and interventions, ordering and review of laboratory studies, ordering and review of radiographic studies, pulse oximetry and re-evaluation of patient's condition.  Final Clinical Impression(s) / ED Diagnoses Final diagnoses:  Multiple subsegmental pulmonary emboli without acute cor pulmonale (HCC)  Chest pain, unspecified type    Rx / DC Orders ED Discharge Orders     None         Timmy Forbes, PA 01/23/24 1710    Tegeler, Marine Sia, MD 01/23/24 [address], Dwaine Gip, PA 02/18/24 1522    Tegeler, Marine Sia, MD 02/20/24 [phone]

## 2024-01-23 NOTE — Progress Notes (Addendum)
 PHARMACY - ANTICOAGULATION CONSULT NOTE  Pharmacy Consult for heparin  Indication: pulmonary embolus  Allergies  Allergen Reactions   Bee Venom Anaphylaxis   Iodinated Contrast Media Anaphylaxis and Other (See Comments)    It is noted that pt is allergic to Iodinated contrast media-iv dye,oral contrast   Chantix [Varenicline Tartrate] Other (See Comments)    "Seizures"    Patient Measurements: Height: 5\' 11"  (180.3 cm) Weight: 65.8 kg (145 lb) IBW/kg (Calculated) : 75.3 HEPARIN  DW (KG): 65.8  Vital Signs: Temp: 98.6 F (37 C) (04/28 1530) Temp Source: Oral (04/28 1530) BP: 93/71 (04/28 1530) Pulse Rate: 100 (04/28 1539)  Labs: Recent Labs    01/23/24 1226 01/23/24 1236 01/23/24 1426  HGB  --  17.1*  --   HCT  --  54.5*  --   PLT  --  208  --   CREATININE 1.71*  --   --   TROPONINIHS 16  --  15    Estimated Creatinine Clearance: 44.4 mL/min (A) (by C-G formula based on SCr of 1.71 mg/dL (H)).   Medical History: Past Medical History:  Diagnosis Date   Acute anterolateral myocardial infarction (HCC) 05/05/2019   Acute MI, anterolateral wall, initial episode of care (HCC) 05/05/2019   S/P Prox LAD 3.5x20 and 3.0x12 Synergy 100% to 0% on 05/05/2019     Alcohol abuse 12/30/2012   Adequate for discharge     Amnesia    Anemia    Anxiety and depression    Chronic systolic heart failure (HCC) 04/14/2020   Congestive heart failure, hypertensive (HCC)    COPD (chronic obstructive pulmonary disease) (HCC)    Crohn's disease (HCC) Deteriorating disk   Crohn's disease (HCC)    Depression    Encounter for assessment of implantable cardioverter-defibrillator (ICD) 04/14/2020   Esophageal reflux 01/20/2013   GERD (gastroesophageal reflux disease)    History of lung cancer    History of MI (myocardial infarction) 05/05/2019   History of stroke    2   Hyperlipidemia    Hypertension    Hypogonadism in male    ICD - Single chamber [employer] VR ICD 09/10/2019  09/10/2019   Kidney disease 01/21/2022   chronic   Lung cancer (HCC)    Major depressive disorder    Mental disorder    Neuropathy    Nonischemic cardiomyopathy (HCC) 08/22/2019   Oxygen  dependent    Rheumatoid arthritis of left wrist (HCC)    Seizure (HCC)    No recent seizures; continue Depakote   Sleep apnea    Stage 3 chronic kidney disease (HCC)    Stroke (HCC)    Tobacco use disorder, continuous 05/05/2019    Medications:  (Not in a hospital admission)  Scheduled:   Assessment: 54 YOM presents with chest pain. Perfusion lung scan with high probability of pulmonary embolus. Patient not on anticoagulation prior to hospitalization. Pharmacy consulted for heparin  dosing.   4/28: Hgb 17.1, PLT 208, D-dimer 16.66  Goal of Therapy:  Heparin  level 0.3-0.7 units/ml Monitor platelets by anticoagulation protocol: Yes   Plan:  Heparin  4000 units IV x1, then heparin  1000 units/hr Monitor heparin  level in 6 hours and daily while on heparin  Monitor CBC and s/sx of bleeding daily F/u long-term anticoagulation plan  ADDENDUM: Discussed with IMTS and decision made to start patient on lovenox. Heparin  gtt not yet started.   Start Lovenox 60 mg subcutaneously every 12 hours Monitor renal function and anti-Xa levels as appropriate Goal anti-Xa level 0.6-1 units/ml  4hrs after LMWH dose given Monitor CBC and s/sx of bleeding  Volney Grumbles, PharmD PGY-1 Acute Care Pharmacy Resident 01/23/2024 4:18 PM

## 2024-01-23 NOTE — Progress Notes (Signed)
 Psychosocial Progressive/Outcome: ANOx4, calm and cooperative Family and friends at bedside   Pain/Comfort Progression/Outcome: Pt did not complain of pain during shift   Clinical Progression/Outcome: Adequate fluid and diet intake  Independent in positioning  X1 assist w/ cane  NPO at MN for US   No skin break down noted while admitting pt

## 2024-01-23 NOTE — Consult Note (Signed)
 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  He was found to have Right sided PE's via V/Q scan since his Cr was 1.7 and he is allergic to the contrast.  Ths evening he had a code called on him as the nurse went to assess him and he was not responding and his HR was in the 40's.  He quickly woke up and he was responsive and code was called off.  His BP was also in the 90's.  Hospitalist service feels he may need ICU level of care and so PCCM called to evaluate.  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 rate 18, height 5\' 11"  (1.803 m), weight 65.8 kg, SpO2 97%.       No intake or output data in the 24 hours ending 01/23/24 2121 Filed Weights   01/23/24 1214  Weight: 65.8 kg    Examination: General: Alert awake, NAD HENT: PERRLA, no icterus, EOMI, no jvd Lungs: CTA b/l, diminished in the bases Lt>Rt, no wheezes no rales Cardiovascular: Regular, s1s2 no murmurs or gallops, AICD noted Abdomen: soft nt nd bs pos no guarding Extremities: no cyanosis, clubbing or edema Neuro: AAO x3 , CN II to XII grossly  intact   Resolved Hospital Problem list   N/a  Assessment & Plan:  Patient alert and awake in NAD, chest pain mild and sharp consistent with his PE, new cough and he felt feverish earlier, but currently afebrile.  SBP 92.   PE-right sided per V/Q scan Continue with anticoagulation-Lovenox 60 q6hr If these are small PE's he may have infarcted areas which tend to be more painful in the pleuritic sense, also, he may be developing pneumonia and the CT would be helpful I have asked Dr Lydia Sams to order non-contrast CT chest Troponins have been flat Echo is pending, so not able to assess right heart strain at this time Left hemidiaphragm elevation with bowel, not sure if this is chronic or new, per patient no previous notion of diaphragmatic issues, CT abdomen to evaluation Hypotension-may require some IV fluids and based on CT findings may need Antibiotics Midodrine can be considered, given his low EF and reluctance for IV fluids  At this time though the patient does not meet ICU criteria.  Please re-consult prn. I will f/u on his CT chest and abdomen.  Best Practice (right click and "Reselect all SmartList Selections" daily)   Per Medicine/Floor protocol  Labs   CBC: Recent Labs  Lab 01/23/24 1236  WBC 8.9  NEUTROABS 6.4  HGB 17.1*  HCT 54.5*  MCV 89.9  PLT 208    Basic Metabolic Panel: Recent Labs  Lab 01/23/24 1226  NA 137  K 4.5  CL 102  CO2 19*  GLUCOSE 101*  BUN 13  CREATININE 1.71*  CALCIUM  9.9   GFR: Estimated Creatinine Clearance: 44.4 mL/min (A) (by C-G formula based on SCr of 1.71 mg/dL (H)). Recent Labs  Lab 01/23/24 1236  WBC 8.9    Liver Function Tests: Recent Labs  Lab 01/23/24 1226  AST 203*  ALT 167*  ALKPHOS 222*  BILITOT 1.4*  PROT 8.3*  ALBUMIN 4.4   No results for input(s): "LIPASE", "AMYLASE" in the last 168 hours. No results for input(s): "AMMONIA" in the last 168 hours.  ABG    Component Value Date/Time   TCO2 27 05/05/2019  0311     Coagulation Profile: No results for input(s): "INR", "PROTIME" in the last 168 hours.  Cardiac Enzymes: No results for input(s): "CKTOTAL", "CKMB", "CKMBINDEX", "TROPONINI" in the last 168 hours.  HbA1C: Hgb A1c MFr Bld  Date/Time Value Ref Range Status  05/05/2019 03:08 AM 5.4 4.8 - 5.6 % Final    Comment:    (NOTE) Pre diabetes:          5.7%-6.4% Diabetes:              >6.4% Glycemic control for   <7.0% adults with diabetes     CBG: No results for input(s): "GLUCAP" in the last 168 hours.  Review of Systems:   C/o chest pain constant and sharp, 3/10, no radiation, light headed when he stands, nausea, no vomiting, mild diarrhea consistent with his Crohns disease  Past Medical History:  He,  has a past medical history of Acute anterolateral myocardial infarction (HCC) (05/05/2019), Acute MI, anterolateral wall, initial episode of care [facility]) (05/05/2019), Alcohol abuse (12/30/2012), Amnesia, Anemia, Anxiety and depression, Chronic systolic heart failure (HCC) (16/06/9603), Congestive heart failure, hypertensive (HCC), COPD (chronic obstructive pulmonary disease) (HCC), Crohn's disease (HCC) (Deteriorating disk), Crohn's disease (HCC), Depression, Encounter for assessment of implantable cardioverter-defibrillator (ICD) (04/14/2020), Esophageal reflux (01/20/2013), GERD (gastroesophageal reflux disease), History of lung cancer, History of MI (myocardial infarction) (05/05/2019), History of stroke, Hyperlipidemia, Hypertension, Hypogonadism in male, ICD - Single chamber [employer] ICD 09/10/2019 (09/10/2019), Kidney disease (01/21/2022), Lung cancer (HCC), Major depressive disorder, Mental disorder, Neuropathy, Nonischemic cardiomyopathy (HCC) (08/22/2019), Oxygen  dependent, Rheumatoid arthritis of left wrist (HCC), Seizure (HCC), Sleep apnea, Stage 3 chronic kidney disease (HCC), Stroke (HCC), and Tobacco use disorder, continuous (05/05/2019).   Surgical History:    Past Surgical History:  Procedure Laterality Date   A-V CARDIAC PACEMAKER INSERTION  09/2018   With a defibrillator   ABDOMINAL SURGERY  2009   Done at [facility]   APPENDECTOMY     APPENDECTOMY  2013   Done at [facility] ACUTE MI REVASCULARIZATION N/A 05/05/2019   Procedure: Coronary/Graft Acute MI Revascularization;  Surgeon: Knox Perl, MD;  Location: [facility] INVASIVE CV LAB;  Service: Cardiovascular;  Laterality: N/A;   EYE SURGERY     ICD IMPLANT N/A 09/10/2019   Procedure: ICD IMPLANT;  Surgeon: Tammie Fall, MD;  Location: [facility] INVASIVE CV LAB;  Service: Cardiovascular;  Laterality: N/A;   intestestine for chrohns disease     LEFT HEART CATH AND CORONARY ANGIOGRAPHY N/A 05/05/2019   Procedure: LEFT HEART CATH AND CORONARY ANGIOGRAPHY;  Surgeon: Knox Perl, MD;  Location: [facility] INVASIVE CV LAB;  Service: Cardiovascular;  Laterality: N/A;   PORT-A-CATH REMOVAL  Social History:   reports that he has been smoking cigarettes. His smokeless tobacco use includes snuff. He reports current alcohol use. He reports current drug use. Drugs: Cocaine and Marijuana.   Family History:  His family history includes Diabetes in his brother; Drug abuse in his daughter.   Allergies Allergies  Allergen Reactions   Bee Venom Anaphylaxis   Iodinated Contrast Media Anaphylaxis and Other (See Comments)    It is noted that pt is allergic to Iodinated contrast media-iv dye,oral contrast   Chantix [Varenicline Tartrate] Other (See Comments)    "Seizures"     Home Medications  Prior to Admission medications   Medication Sig Start Date End Date Taking? Authorizing Provider  aspirin  EC 81 MG tablet Take 162 mg by mouth in the morning. Swallow whole.   Yes [provider]  atorvastatin  (LIPITOR ) 80 MG tablet Take 1 tablet (80 mg total) by mouth daily. Patient taking differently: Take 80 mg by mouth daily at 6 PM. 04/05/23  Yes Terrance Ferretti, NP  Budeson-Glycopyrrol-Formoterol   (BREZTRI AEROSPHERE) 160-9-4.8 MCG/ACT AERO Inhale 2 puffs into the lungs in the morning and at bedtime.   Yes [provider]  Coenzyme Q10 (COQ10) 100 MG CAPS Take 100 mg by mouth in the morning.   Yes [provider]  divalproex (DEPAKOTE) 250 MG DR tablet Take 250-500 mg by mouth See admin instructions. Take 250 mg by mouth in the morning and 500 mg at bedtime   Yes [provider]  docusate sodium (COLACE) 100 MG capsule Take 200 mg by mouth in the morning.   Yes [provider]  EPINEPHrine 0.3 mg/0.3 mL IJ SOAJ injection Inject 0.3 mg into the muscle as needed for anaphylaxis.   Yes [provider]  esomeprazole  (NEXIUM ) 40 MG capsule Take 40 mg by mouth daily before breakfast. 12/09/20  Yes [provider]  ferrous sulfate  325 (65 FE) MG tablet Take 1 tablet (325 mg total) by mouth daily with breakfast. 01/01/13  Yes Lissa Riding, NP  gabapentin (NEURONTIN) 600 MG tablet Take 600 mg by mouth in the morning, at noon, and at bedtime. 05/02/19  Yes [provider]  ipratropium-albuterol  (DUONEB) 0.5-2.5 (3) MG/3ML SOLN Inhale 3 mLs into the lungs See admin instructions. Nebulize 3 ml's and inhale into the lungs every 4-6 hours or as needed for shortness of breath and/or wheezing 05/02/19  Yes [provider]  ivabradine  (CORLANOR) 5 MG TABS tablet Take 1 tablet (5 mg total) by mouth 2 (two) times daily with a meal. TAKE 1 TABLET BY MOUTH TWICE(2) DAILY WITH MEALS Patient taking differently: Take 5 mg by mouth in the morning and at bedtime. 05/03/23  Yes Revankar, Micael Adas, MD  magnesium  oxide (MAG-OX) 400 MG tablet Take 400 mg by mouth in the morning, at noon, and at bedtime. 12/02/22  Yes [provider]  methocarbamol (ROBAXIN) 750 MG tablet Take 750 mg by mouth 2 (two) times daily as needed for muscle spasms.   Yes [provider]  metoprolol  succinate (TOPROL -XL) 50 MG 24 hr tablet Take 1 tablet (50 mg total) by  mouth 2 (two) times daily. Take with or immediately following a meal. 05/03/23 01/23/24 Yes Revankar, Rajan R, MD  mirtazapine (REMERON) 7.5 MG tablet Take 7.5 mg by mouth at bedtime.   Yes [provider]  Multiple Vitamins-Minerals (MULTI FOR HIM 50+) TABS Take 1 tablet by mouth daily with breakfast.   Yes [provider]  nitroGLYCERIN  (NITROSTAT )  0.4 MG SL tablet DISSOLVE 1 TABLET UNDER THE TONGUE EVERY 5 MINUTES AS NEEDED FOR CHEST PAIN. (MAXIMUM OF 3 DOSES) 04/05/22  Yes Krasowski, Robert J, MD  pyridOXINE (VITAMIN B-6) 100 MG tablet Take 100 mg by mouth in the morning.   Yes [provider]  sacubitril-valsartan (ENTRESTO ) 49-51 MG TAKE ONE TABLET BY MOUTH TWICE DAILY Patient taking differently: Take 1 tablet by mouth in the morning and at bedtime. 01/18/24  Yes Revankar, Rajan R, MD  sertraline (ZOLOFT) 100 MG tablet Take 150 mg by mouth in the morning. 12/09/20  Yes [provider]  thiamine (VITAMIN B-1) 50 MG tablet Take 50 mg by mouth in the morning.   Yes [provider]  vitamin B-12 (CYANOCOBALAMIN) 500 MCG tablet Take 500 mcg by mouth in the morning.   Yes [provider]  Zinc 50 MG TABS Take 50 mg by mouth in the morning.   Yes [provider]  acetaminophen  (TYLENOL ) 325 MG tablet Take 650 mg by mouth every 6 (six) hours as needed for moderate pain or headache.    [provider]  Albuterol  Sulfate (PROAIR  RESPICLICK) 108 (90 Base) MCG/ACT AEPB Inhale 2 puffs into the lungs every 6 (six) hours as needed (shortness of breath and wheezing).    [provider]  ezetimibe  (ZETIA ) 10 MG tablet Take 1 tablet (10 mg total) by mouth daily. 01/25/23 01/23/24  Terrance Ferretti, NP  ibuprofen (ADVIL) 200 MG tablet Take 800 mg by mouth every 8 (eight) hours as needed for headache or moderate pain.    [provider]  montelukast (SINGULAIR) 10 MG tablet Take 10 mg by mouth daily. 05/02/19   [provider]   Multiple Vitamins-Minerals (MULTIVITAMIN WITH MINERALS) tablet Take 1 tablet by mouth daily. Patient not taking: Reported on 01/23/2024 01/01/13   Lissa Riding, NP  nicotine  (NICODERM CQ  - DOSED IN MG/24 HOURS) 21 mg/24hr patch Place 21 mg onto the skin daily.    [provider]  predniSONE  (DELTASONE ) 10 MG tablet Take 10 mg by mouth daily with breakfast. 10/18/22   [provider]     Critical care time: 26   The patient is critically ill with multiple organ system failure and requires high complexity decision making for assessment and support, frequent evaluation and titration of therapies, advanced monitoring, review of radiographic studies and interpretation of complex data.   Critical Care Time devoted to patient care services, exclusive of separately billable procedures, described in this note is 32 minutes.   Claven Cumming, MD [facility] See Amion for pager  If no response to pager , please call [phone] until 7pm After 7:00 pm call Elink  [phone] 01/23/2024, 9:21 PM

## 2024-01-23 NOTE — ED Notes (Signed)
 Transported to xray

## 2024-01-23 NOTE — H&P (Signed)
 Date: 01/23/2024               Patient Name:  Jimmy MUNKRES Sr. MRN: [id number]  DOB: 1966-08-31 Age / Sex: 58 y.o., male   PCP: Barbar Levine, MD         Medical Service: Internal Medicine Teaching Service         Attending Physician: Dr. Cherylene Corrente, MD      First Contact: Jose Ngo, MD     Second Contact: Lorelle Roll, DO         After Hours (After 5p/  First Contact Pager: [phone]  weekends / holidays): Second Contact Pager: [phone]   SUBJECTIVE   Chief Complaint:  Chief Complaint  Patient presents with   Chest Pain   History of Present Illness:   Jimmy Benezra. 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  He states that this chest pain was intermittent coming and going, and more gripping in nature that did not radiate and was localized to the central chest.  However the time that these episodes would last started getting more severe, where he started having shortness of breath and became diaphoretic.  He took 4 aspirins that were provided by the ED but he did not take his home nitroglycerin .  He did not do this because his chest pain did not last longer than 5 to 10 minutes.  However the last 1 was very severe.  He says that these ones are very different than the times when he got his heart attacks and that his prior MIs were associated with elephant on his chest versus this 1 was more associated with a dull ache.  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  He denies being on any other anticoagulant besides his aspirin .  He denies ever having a  clot.  He denies having any recent travel.  He does have a history of lung cancer x 2 status post possible resection although he is unable to tell me if he had a resection or not.  He used to do drugs but not currently, including cocaine.   Besides nausea, he denies any other symptoms.  He was feeling fine until he was at the court house this morning.  He says that this episodes were not associated with any stress.  He was just sitting for long periods of time.  ED Course:  At the ED, he was afebrile, with a respiratory rate of 22, and tachycardic at 105.  Blood pressure was 94/76 with a MAP of 83.  His troponins were flat at 15 and 16.  An EKG just showed sinus tachycardia and no signs of STEMI.  D-dimer was 16.6.  White cell was within normal limits at 8.9.  Hemoglobin was 17.1 chest x-ray was significant for emphysematous changes and pulmonary scarring but no acute superimposed pulmonary process. A VQ scan was ordered as he has anaphylaxis to iodinated contrast.  This revealed two or greater segmental perfusion defects in the lungs consistent with high probability  of pulmonary embolus. IMTS was paged for their admission. Of note patient is on 2.5L of O2 at baseline for a reason he is unable to tell me.   Meds:  Current Meds  Medication Sig   aspirin  EC 81 MG tablet Take 162 mg by mouth in the morning. Swallow whole.   atorvastatin  (LIPITOR ) 80 MG tablet Take 1 tablet (80 mg total) by mouth daily. (Patient taking differently: Take 80 mg by mouth daily at 6 PM.)   Budeson-Glycopyrrol-Formoterol  (BREZTRI AEROSPHERE) 160-9-4.8 MCG/ACT AERO Inhale 2 puffs into the lungs in the morning and at bedtime.   Coenzyme Q10 (COQ10) 100 MG CAPS Take 100 mg by mouth in the morning.   divalproex (DEPAKOTE) 250 MG DR tablet Take 250-500 mg by mouth See admin instructions. Take 250 mg by mouth in the morning and 500 mg at bedtime   docusate sodium (COLACE) 100 MG capsule Take 200 mg by mouth in the morning.    EPINEPHrine 0.3 mg/0.3 mL IJ SOAJ injection Inject 0.3 mg into the muscle as needed for anaphylaxis.   esomeprazole  (NEXIUM ) 40 MG capsule Take 40 mg by mouth daily before breakfast.   ferrous sulfate  325 (65 FE) MG tablet Take 1 tablet (325 mg total) by mouth daily with breakfast.   gabapentin (NEURONTIN) 600 MG tablet Take 600 mg by mouth in the morning, at noon, and at bedtime.   ipratropium-albuterol  (DUONEB) 0.5-2.5 (3) MG/3ML SOLN Inhale 3 mLs into the lungs See admin instructions. Nebulize 3 ml's and inhale into the lungs every 4-6 hours or as needed for shortness of breath and/or wheezing   ivabradine  (CORLANOR) 5 MG TABS tablet Take 1 tablet (5 mg total) by mouth 2 (two) times daily with a meal. TAKE 1 TABLET BY MOUTH TWICE(2) DAILY WITH MEALS (Patient taking differently: Take 5 mg by mouth in the morning and at bedtime.)   magnesium  oxide (MAG-OX) 400 MG tablet Take 400 mg by mouth in the morning, at noon, and at bedtime.   methocarbamol (ROBAXIN) 750 MG tablet Take 750 mg by mouth 2 (two) times daily as needed for muscle spasms.   metoprolol  succinate (TOPROL -XL) 50 MG 24 hr tablet Take 1 tablet (50 mg total) by mouth 2 (two) times daily. Take with or immediately following a meal.   mirtazapine (REMERON) 7.5 MG tablet Take 7.5 mg by mouth at bedtime.   Multiple Vitamins-Minerals (MULTI FOR HIM 50+) TABS Take 1 tablet by mouth daily with breakfast.   nitroGLYCERIN  (NITROSTAT ) 0.4 MG SL tablet DISSOLVE 1 TABLET UNDER THE TONGUE EVERY 5 MINUTES AS NEEDED FOR CHEST PAIN. (MAXIMUM OF 3 DOSES)   pyridOXINE (VITAMIN B-6) 100 MG tablet Take 100 mg by mouth in the morning.   sacubitril-valsartan (ENTRESTO ) 49-51 MG TAKE ONE TABLET BY MOUTH TWICE DAILY (Patient taking differently: Take 1 tablet by mouth in the morning and at bedtime.)   sertraline (ZOLOFT) 100 MG tablet Take 150 mg by mouth in the morning.   thiamine (VITAMIN B-1) 50 MG tablet Take 50 mg by mouth in the morning.   vitamin B-12  (CYANOCOBALAMIN) 500 MCG tablet Take 500 mcg by mouth in the morning.   Zinc 50 MG TABS Take 50 mg by mouth in the morning.    Past Medical History  Past Surgical History:  Procedure Laterality Date   A-V CARDIAC PACEMAKER INSERTION  09/2018   With a defibrillator   ABDOMINAL SURGERY  2009   Done at [facility]   APPENDECTOMY  APPENDECTOMY  2013   Done at [facility] ACUTE MI REVASCULARIZATION N/A 05/05/2019   Procedure: Coronary/Graft Acute MI Revascularization;  Surgeon: Knox Perl, MD;  Location: [facility] INVASIVE CV LAB;  Service: Cardiovascular;  Laterality: N/A;   EYE SURGERY     ICD IMPLANT N/A 09/10/2019   Procedure: ICD IMPLANT;  Surgeon: Tammie Fall, MD;  Location: [facility] INVASIVE CV LAB;  Service: Cardiovascular;  Laterality: N/A;   intestestine for chrohns disease     LEFT HEART CATH AND CORONARY ANGIOGRAPHY N/A 05/05/2019   Procedure: LEFT HEART CATH AND CORONARY ANGIOGRAPHY;  Surgeon: Knox Perl, MD;  Location: [facility] INVASIVE CV LAB;  Service: Cardiovascular;  Laterality: N/A;   PORT-A-CATH REMOVAL      Social:  Lives With: With his fiance at home 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 Support: Family Level of Function: Mildly independent in all his ADLs and IADLs  - used to smoke 3.5 packs/day since age 86 but still smokes 2-3 cigarettes/day - previously used cocaine, illicit substances PCP: Barbar Levine, MD  Family History:   Family History  Problem Relation Age of Onset   Diabetes Brother    Drug abuse Daughter        heroin addict   Allergies: Allergies as of 01/23/2024 - Review Complete 01/23/2024  Allergen Reaction Noted   Bee venom Anaphylaxis 09/04/2019   Iodinated contrast media Anaphylaxis and Other (See Comments) 12/29/2012   Chantix [varenicline tartrate] Other (See Comments) 09/04/2019    Review of Systems: A complete ROS was negative except as per HPI.   OBJECTIVE:   Physical  Exam: Blood pressure 94/76, pulse 64, temperature 98.6 F (37 C), temperature source Oral, resp. rate (!) 22, height 5\' 11"  (1.803 m), weight 65.8 kg, SpO2 100%.  Constitutional: Chronically ill-appearing, sitting comfortably in bed HENT: normocephalic atraumatic, mucous membranes moist Eyes: conjunctiva non-erythematous Neck: supple Cardiovascular: Tachycardic, no bilateral lower extremity edema Pulmonary/Chest: normal work of breathing on room air, decreased lung sounds on the left, crackles in the bilateral lower bases Abdominal: soft, non-tender, non-distended MSK: normal bulk and tone Neurological: alert & oriented x 3 Skin: warm and dry  Labs: CBC    Component Value Date/Time   WBC 8.9 01/23/2024 1236   RBC 6.06 (H) 01/23/2024 1236   HGB 17.1 (H) 01/23/2024 1236   HGB 13.8 01/25/2023 1618   HCT 54.5 (H) 01/23/2024 1236   HCT 43.5 01/25/2023 1618   PLT 208 01/23/2024 1236   PLT 238 01/25/2023 1618   MCV 89.9 01/23/2024 1236   MCV 89 01/25/2023 1618   MCH 28.2 01/23/2024 1236   MCHC 31.4 01/23/2024 1236   RDW 14.0 01/23/2024 1236   RDW 13.3 01/25/2023 1618   LYMPHSABS 1.3 01/23/2024 1236   LYMPHSABS 1.4 01/25/2023 1618   MONOABS 1.0 01/23/2024 1236   EOSABS 0.1 01/23/2024 1236   EOSABS 0.1 01/25/2023 1618   BASOSABS 0.1 01/23/2024 1236   BASOSABS 0.1 01/25/2023 1618     CMP     Component Value Date/Time   NA 137 01/23/2024 1226   NA 142 01/25/2023 1618   K 4.5 01/23/2024 1226   CL 102 01/23/2024 1226   CO2 19 (L) 01/23/2024 1226   GLUCOSE 101 (H) 01/23/2024 1226   BUN 13 01/23/2024 1226   BUN 14 01/25/2023 1618   CREATININE 1.71 (H) 01/23/2024 1226   CALCIUM  9.9 01/23/2024 1226   PROT 8.3 (H) 01/23/2024 1226  PROT 7.3 01/09/2020 1118   ALBUMIN 4.4 01/23/2024 1226   ALBUMIN 4.4 01/09/2020 1118   AST 203 (H) 01/23/2024 1226   ALT 167 (H) 01/23/2024 1226   ALKPHOS 222 (H) 01/23/2024 1226   BILITOT 1.4 (H) 01/23/2024 1226   BILITOT 0.4 01/09/2020 1118    GFRNONAA 46 (L) 01/23/2024 1226   GFRAA 59 (L) 10/15/2020 1413    Imaging:  NM Pulmonary Perfusion Result Date: 01/23/2024 CLINICAL DATA:  Pulmonary embolus suspected with high probability. Shortness of breath. EXAM: NUCLEAR MEDICINE PERFUSION LUNG SCAN TECHNIQUE: Perfusion images were obtained in multiple projections after intravenous injection of radiopharmaceutical. Ventilation scans intentionally deferred if perfusion scan and chest x-ray adequate for interpretation during COVID 19 epidemic. RADIOPHARMACEUTICALS:  3.9 mCi Tc-67m MAA IV COMPARISON:  Chest radiograph 01/23/2024.  CT chest 10/20/2023 FINDINGS: Pulmonary perfusion images demonstrate a large defect at the left lung base corresponding to chest radiographic abnormalities including consolidation and volume loss. There is a segmental defect in the posterior segment of the right upper lung and a segmental defect in the anterior segment of the right upper lung. Additional subsegmental defect in the left upper lung. Some of this could be accounted for by severe emphysematous changes are noted in the lungs on chest radiograph. However, the finding of 2 or more segmental defects in the setting of high clinical probability is suggestive of high probability of pulmonary embolus. IMPRESSION: Two or greater segmental perfusion defects in the lungs consistent with high probability of pulmonary embolus. These results were called by telephone at the time of interpretation on 01/23/2024 at 4:08 pm to provider BROOKE SMALL , who verbally acknowledged these results. Electronically Signed   By: Boyce Byes M.D.   On: 01/23/2024 16:13   DG Chest 2 View Result Date: 01/23/2024 CLINICAL DATA:  Chest pain. EXAM: CHEST - 2 VIEW COMPARISON:  Chest CT 10/20/2023 FINDINGS: The cardiac silhouette, mediastinal and hilar contours are within normal limits and stable. Right ventricular pacer wire in good position. Stable marked eventration of the left hemidiaphragm  with overlying vascular crowding and atelectasis. Significant underlying emphysematous changes and pulmonary scarring but no definite acute superimposed pulmonary process. IMPRESSION: 1. 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 2. Significant underlying emphysematous changes and pulmonary scarring but no definite acute superimposed pulmonary process. Electronically Signed   By: Marrian Siva M.D.   On: 01/23/2024 15:10    EKG: Tachycardic sinus  ASSESSMENT & PLAN:   Assessment & Plan by Problem: Principal Problem:   Pulmonary emboli (HCC)  Jimmy June Sr. is a 58 y.o. 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  Pulmonary emboli  on the posterior segment of the right upper lung, anterior segment of the right upper lung, left upper lung Wells score is at least 6.0.  Pes determined by VQ scan.  He is short of breath and has tachycardiac.  Has had a recent surgery about 2 months ago and since then he has been laying down more in bed.  He also has a history of lung cancer.  Certainly has multiple risk factors for PE.   We will do an echocardiogram given that he has chest pain and there may be a heart strain. Will start  anticoagulation. Cr is 1.71 and appears to be at baseline.  -lovenox q12 hrs  -Echocardiogram  Elevated transaminases Hx of alcohol abuse  AST is currently 203 and ALT is 167, Alk phos is 222, total bilirubin is elevated at 1.4.  He does have crackles in the lower lung bases, and I question whether he does have liver congestion.  I will run a hepatitis panel to make sure that he does not have any active hepatitis and do a right upper quadrant scan given the severity of his transaminases.   He was not tender to palpation on the abdomen today.  I did not appreciate hepatosplenomegaly.  He also has a history of alcohol abuse and the elevation may be related to this. -RUQ scan  -Hepatitis panel  -Depakote level -CIWA scores  -cw Thiamine low dose daily  -daily MV   HFrEF Last echo was in 2020 with an ejection fraction of 25 to 30% and severe left systolic dysfunction.  Currently taking Lipitor  80 mg, Toprol  50 mg twice daily, nitroglycerin  as needed, Entresto  49-51 mg twice daily.  His creatinine seems to be stable at baseline.  Currently has bilateral crackles on lung exam and 2.5 L at baseline.  Seems that there was no pulmonary edema on chest x-ray.  He may be euvolemic. -Continue with metoprolol  50 mg twice daily -hold Lipitor  80 mg daily -hold Entresto  49-50 mg twice daily -Echo  -I/Os -Standing weights only   HLD  CAD  With ICD placed.  On ezetimibe  10 mg daily and atorvastatin  80 mg daily.  Will hold this given LFT elevations.  History of seizures Sure the etiology, he does have a history of alcohol abuse and also had 2 strokes.  Currently on the valproate 250 mg delayed release in the Am and 500mg  at bedtime.  We will continue with the same.   Anemia On iron supplementation.  Unsure if he is up-to-date on colonoscopy.  Back pain States that he has recently been diagnosed with scoliosis.  He usually takes gabapentin 600 mg 3 times daily.  We will continue with the same.  COPD Seems like he is using 2.5 L at baseline.  He is on Breztri, seems that at some point he was on montelukast 10 mg daily but he has not dispensed since his November 2024. -Continue with Breztri  -albuterol  prn -Duoneb q6PRN  Hypertension On ivabradine  5 mg BID.  We will continue with this.  Mood disorder On 150 mg of sertraline daily.  Will continue with the same.  Diet: Heart Healthy VTE: Lovenox IVF: None, Code: Full  Prior to Admission Living Arrangement: Home, living with  fiancee Anticipated Discharge Location: Home Barriers to Discharge: medical work-up  Dispo: Admit patient to Inpatient with expected length of stay greater than 2 midnights.  Signed: [facility]  Internal Medicine Resident, PGY-1 [facility]  01/23/2024, 6:03 PM

## 2024-01-23 NOTE — ED Notes (Signed)
Pt transported to Nuc [facility]. 

## 2024-01-23 NOTE — ED Triage Notes (Signed)
 Pt to ED via [facility]. Pt c/o chest pain. Pt received ASA en route. Pt is A&Ox4 on arrival. Pt states he was sitting in court room and a sharp pain occur in mid-sternal area. Pt states he had one pain and then it went away and then he had another one in approximately 5 minutes and he started sweating. Pt states he continued to sit still and started having more chest pains. Pt did not have his home ntg with him. Pt has had SHOB and nausea with pains.  120/70 HR 120 96% 2.5L [place] (home 02)

## 2024-01-23 NOTE — Hospital Course (Addendum)
 Pulmonary emboli  on the posterior segment of the right upper lung, anterior segment of the right upper lung, left upper lung Chest Pain Jimmy Nelson. 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  He describes this pain being different than his normal heart attacks.  Was given aspirin  and found no relief.  He is allergic to contrast, and with his poor creatinine function, VQ scan was done and it was discovered that he had pulmonary emboli on the right upper lung.  He was starting on Lovenox  and then transitioned to apixaban .  He did have some hemoptysis during his hospitalization which then resolved.  He is to remain on apixaban  10 mg twice a day for 7 days (until 02/01/2024) followed by apixaban  5 mg twice daily indefinitely given that these are less likely provoked.  He does have a Jimmy of heart failure.  An echo revealed decreased ejection fraction of 20 to 25% with severe left diastolic dysfunction.  There may also be an aneurysm on the left ventricle.  The presence of a thrombus could not be ruled out.  He does have a significant Jimmy of prior MIs x 2 and has been having chest pain during his hospitalization.  This could be due to his Pes but he also has a significant cardiac Jimmy. Troponins were not elevated (highest 16) and EKGs has never shown any ST changes.  He does have a Jimmy of GERD, and describes the chest pain as burning and more localized near the epigastrium. He was using Protonix  40 mg once daily.  We added Pepcid  20 mg at bedtime and protonix  was increased to BID to help with this chest pain.  He is to continue on Tylenol  up to 500 mg every 8 hours as needed for pain with a max of 2g of acetaminophen  daily given his current hepatitis. -Please follow-up on chest pain.  There were no signs of right heart strain on  echo. -Tylenol  500 mg every 8 hours as needed for pain for maximum of 2 g daily giving LFT elevations.  Once LFTs normalize he may be able to go up to 4,000 mg a day. -Continue with apixaban  10mg  twice daily, transition to 5mg  twice daily on 02/02/2024 -Hold aspirin  until 02/02/2024 when he transitions to apixaban  5mg  twice daily -Protonix  40 mg twice daily -Famotidine  20 mg at bedtime -Lifestyle modification for GERD  Hepatitis A Incidentally Jimmy Nelson came in with nausea and elevated transaminases.  Hepatitis panel was obtained and it was found that he had hepatitis A.  He is needed Zofran  intermittently for nausea.  He will use Zofran  only when absolutely needed for his nausea.  His nausea has been improving during the course of his hospitalization has been needing it less and less. -Follow LFTs -Can restar ezetimibe  but will need to re-start statin once LFTs normalize  Jimmy of alcohol abuse Relapse Jimmy Nelson has been off of alcohol for many many years and had not drank for about 20 years. However on Sunday, he drank a bottle of fifth.  He was given resources to help him to remain abstinent of alcohol.  He may benefit from starting naltrexone if he continues to crave alcohol and is relapsing. Please follow up OP. -Consider counseling -Consider naltrexone in the future if he still craving alcohol.  Heart  failure with reduced ejection fraction An echo was done on 01/24/2024 that showed an ejection fraction of 20 to 25% with severe left diastolic dysfunction, a left ventricular aneurysm, a thrombus could not be ruled out.  He is currently on Lipitor  80 mg, and was taking metoprolol  50 mg twice daily at home.  Other home medications included nitroglycerin  as needed Entresto  49-51 milligrams twice daily.  During the course of his hospitalization he was bradycardic around the 30s and his blood pressures were soft.  Because of this his metoprolol  was held and then restarted a lower dose of 12.5  mg once daily. This was uptitrated to 12.5mg  BID at day of discharge.  Once blood pressure continues to increase he may benefit from increasing his dose. Consider starting Farxiga 10 mg and an SGLT2 inhibitor.  Depending how his blood pressures do he may also be able to tolerate ACE, ARB, ARNI or MRA in the future.   - Follow-up on blood pressures and adjust GDMT as tolerated -Patient has a follow-up with cardiology on 01/31/2024  COPD Tobacco abuse Was using 2.5 L at baseline.  Has been weaned to room air with an oxygenation up to 100%.  Goal oxygenation requirements for person with COPD is 88 to 92%.  He does not need home oxygen .  He is on Breztri , seems that at some point he was on montelukast 10 mg daily but he has not dispensed since his November 2024.  He will benefit from quitting smoking. -Continue with Breztri   -albuterol  prn -Duoneb q6PRN -Continue with smoking cessation counseling  -Cw Nicotine  patches  -Got seizures with varenicline  HLD  CAD  With ICD placed.  On ezetimibe  10 mg daily and atorvastatin  80 mg daily.  Will continue to hold atorvastatin  given LFT elevation. -Can restart ezetimibe  10mg  daily -restart atorvastatin  80mg  daily once LFTs resolve   Jimmy of seizures Depakote  level was less than 10.  States that he has been taking his Depakote .  His last stroke was about 6 months ago.  He denies it being secondary to his alcohol use.  States that these started after he was given varenicline for smoking cessation.  His PCP prescribes his Depakote .  Given that his LFTs are improving, we will continue on the valproate 250 mg in the morning and 500 mg at bedtime.  He will need outpatient follow-up to determine if he still needs this given that he is off varenicline. Could be that he has a lower seizure threshold with hx of CVA and alcohol use.   -Will need follow-up with PCP and may need referral to neurology if he continues having seizures despite discontinuing varenicline.    Back pain States that he has recently been diagnosed with scoliosis.  He usually takes gabapentin  600 mg 3 times daily.    CKD Stage II  Baseline around 1.3-1.5. At baseline on discharge.   Hypertension On ivabradine  5 mg BID.  BP during the course of his hospitalization have been soft/low with SBPs in the 90s. Due to heavy PVC burden he was restarted on metoprolol .  -Cw Metoprolol  12.5mg  BID with close PCP follow-up  Mood disorder On 150 mg of sertraline  daily.  Will continue with the same.

## 2024-01-23 NOTE — Progress Notes (Signed)
 Evaluated at bedside for bradycardia.  Charting showed patient braiding down into the 20s and 30s.  On exam, patient is alert and oriented x 3.  He is able to talk to me and has coherent speech.  Patient does have strong carotid pulse.  Patient has a faint radial pulses.  On auscultation patient does not have any murmurs, but does have distant heart sounds.  I evaluated telemetry which showed patient consistently in the 90s.  I think there is a discrepancy between the pulse ox and the telemetry.  Telemetry shows patient consistently in the 90s.  Given clinical picture, do not think patient is having symptomatic bradycardia.  Will continue to keep a close eye on this.  Obtaining magnesium  as well as troponin.  Reviewed EKG which did not show bradycardia.  Did show ventricular bigeminy.

## 2024-01-23 NOTE — Progress Notes (Signed)
 Responded to room as patient had CODE BLUE called.  Patient never lost pulses or never lost airway.  There is some concern that patient was not as responsive.  On evaluation, patient had chest pain and was responding slowly but appropriately.  With concern for worsening hypotension we will consult PCCM for further evaluation and management.  Discussed with my attending as well.

## 2024-01-24 ENCOUNTER — Observation Stay (HOSPITAL_COMMUNITY)

## 2024-01-24 ENCOUNTER — Other Ambulatory Visit (HOSPITAL_COMMUNITY): Payer: Self-pay

## 2024-01-24 ENCOUNTER — Telehealth (HOSPITAL_COMMUNITY): Payer: Self-pay | Admitting: Pharmacy Technician

## 2024-01-24 DIAGNOSIS — M419 Scoliosis, unspecified: Secondary | ICD-10-CM | POA: Diagnosis present

## 2024-01-24 DIAGNOSIS — F1010 Alcohol abuse, uncomplicated: Secondary | ICD-10-CM | POA: Diagnosis present

## 2024-01-24 DIAGNOSIS — E861 Hypovolemia: Secondary | ICD-10-CM | POA: Diagnosis present

## 2024-01-24 DIAGNOSIS — I252 Old myocardial infarction: Secondary | ICD-10-CM | POA: Diagnosis not present

## 2024-01-24 DIAGNOSIS — I10 Essential (primary) hypertension: Secondary | ICD-10-CM | POA: Diagnosis not present

## 2024-01-24 DIAGNOSIS — I9589 Other hypotension: Secondary | ICD-10-CM | POA: Diagnosis present

## 2024-01-24 DIAGNOSIS — I253 Aneurysm of heart: Secondary | ICD-10-CM | POA: Diagnosis present

## 2024-01-24 DIAGNOSIS — B159 Hepatitis A without hepatic coma: Secondary | ICD-10-CM | POA: Diagnosis present

## 2024-01-24 DIAGNOSIS — I110 Hypertensive heart disease with heart failure: Secondary | ICD-10-CM | POA: Diagnosis not present

## 2024-01-24 DIAGNOSIS — I130 Hypertensive heart and chronic kidney disease with heart failure and stage 1 through stage 4 chronic kidney disease, or unspecified chronic kidney disease: Secondary | ICD-10-CM | POA: Diagnosis present

## 2024-01-24 DIAGNOSIS — D509 Iron deficiency anemia, unspecified: Secondary | ICD-10-CM | POA: Diagnosis present

## 2024-01-24 DIAGNOSIS — I5022 Chronic systolic (congestive) heart failure: Secondary | ICD-10-CM | POA: Diagnosis present

## 2024-01-24 DIAGNOSIS — I493 Ventricular premature depolarization: Secondary | ICD-10-CM | POA: Diagnosis present

## 2024-01-24 DIAGNOSIS — N1831 Chronic kidney disease, stage 3a: Secondary | ICD-10-CM | POA: Diagnosis present

## 2024-01-24 DIAGNOSIS — J449 Chronic obstructive pulmonary disease, unspecified: Secondary | ICD-10-CM | POA: Diagnosis present

## 2024-01-24 DIAGNOSIS — I69398 Other sequelae of cerebral infarction: Secondary | ICD-10-CM | POA: Diagnosis not present

## 2024-01-24 DIAGNOSIS — I2510 Atherosclerotic heart disease of native coronary artery without angina pectoris: Secondary | ICD-10-CM | POA: Diagnosis present

## 2024-01-24 DIAGNOSIS — R042 Hemoptysis: Secondary | ICD-10-CM | POA: Diagnosis not present

## 2024-01-24 DIAGNOSIS — I2694 Multiple subsegmental pulmonary emboli without acute cor pulmonale: Secondary | ICD-10-CM

## 2024-01-24 DIAGNOSIS — I2699 Other pulmonary embolism without acute cor pulmonale: Secondary | ICD-10-CM | POA: Diagnosis present

## 2024-01-24 DIAGNOSIS — Z9049 Acquired absence of other specified parts of digestive tract: Secondary | ICD-10-CM | POA: Diagnosis not present

## 2024-01-24 DIAGNOSIS — M069 Rheumatoid arthritis, unspecified: Secondary | ICD-10-CM | POA: Diagnosis present

## 2024-01-24 DIAGNOSIS — I428 Other cardiomyopathies: Secondary | ICD-10-CM | POA: Diagnosis present

## 2024-01-24 DIAGNOSIS — E785 Hyperlipidemia, unspecified: Secondary | ICD-10-CM | POA: Diagnosis present

## 2024-01-24 DIAGNOSIS — F39 Unspecified mood [affective] disorder: Secondary | ICD-10-CM | POA: Diagnosis present

## 2024-01-24 DIAGNOSIS — R569 Unspecified convulsions: Secondary | ICD-10-CM | POA: Diagnosis present

## 2024-01-24 DIAGNOSIS — R079 Chest pain, unspecified: Secondary | ICD-10-CM

## 2024-01-24 DIAGNOSIS — R7989 Other specified abnormal findings of blood chemistry: Secondary | ICD-10-CM | POA: Diagnosis not present

## 2024-01-24 DIAGNOSIS — K219 Gastro-esophageal reflux disease without esophagitis: Secondary | ICD-10-CM | POA: Diagnosis present

## 2024-01-24 DIAGNOSIS — R001 Bradycardia, unspecified: Secondary | ICD-10-CM | POA: Diagnosis not present

## 2024-01-24 LAB — BASIC METABOLIC PANEL WITH GFR
Anion gap: 8 (ref 5–15)
BUN: 13 mg/dL (ref 6–20)
CO2: 26 mmol/L (ref 22–32)
Calcium: 9.2 mg/dL (ref 8.9–10.3)
Chloride: 104 mmol/L (ref 98–111)
Creatinine, Ser: 1.41 mg/dL — ABNORMAL HIGH (ref 0.61–1.24)
GFR, Estimated: 58 mL/min — ABNORMAL LOW (ref >60)
Glucose, Bld: 102 mg/dL — ABNORMAL HIGH (ref 70–99)
Potassium: 3.8 mmol/L (ref 3.5–5.1)
Sodium: 138 mmol/L (ref 135–145)

## 2024-01-24 LAB — CBC
HCT: 47.2 % (ref 39.0–52.0)
Hemoglobin: 15.4 g/dL (ref 13.0–17.0)
MCH: 28.2 pg (ref 26.0–34.0)
MCHC: 32.6 g/dL (ref 30.0–36.0)
MCV: 86.3 fL (ref 80.0–100.0)
Platelets: 202 10*3/uL (ref 150–400)
RBC: 5.47 MIL/uL (ref 4.22–5.81)
RDW: 14.3 % (ref 11.5–15.5)
WBC: 7.6 10*3/uL (ref 4.0–10.5)
nRBC: 0 % (ref 0.0–0.2)

## 2024-01-24 LAB — GLUCOSE, CAPILLARY
Glucose-Capillary: 114 mg/dL — ABNORMAL HIGH (ref 70–99)
Glucose-Capillary: 90 mg/dL (ref 70–99)

## 2024-01-24 LAB — HEPATIC FUNCTION PANEL
ALT: 135 U/L — ABNORMAL HIGH (ref 0–44)
AST: 96 U/L — ABNORMAL HIGH (ref 15–41)
Albumin: 3.5 g/dL (ref 3.5–5.0)
Alkaline Phosphatase: 176 U/L — ABNORMAL HIGH (ref 38–126)
Bilirubin, Direct: 0.2 mg/dL (ref 0.0–0.2)
Indirect Bilirubin: 0.8 mg/dL (ref 0.3–0.9)
Total Bilirubin: 1 mg/dL (ref 0.0–1.2)
Total Protein: 6.8 g/dL (ref 6.5–8.1)

## 2024-01-24 LAB — TROPONIN I (HIGH SENSITIVITY): Troponin I (High Sensitivity): 16 ng/L (ref <18)

## 2024-01-24 LAB — ECHOCARDIOGRAM COMPLETE
AV Peak grad: 3.2 mmHg
Ao pk vel: 0.89 m/s
Height: 71 in
Weight: 2335.11 [oz_av]

## 2024-01-24 LAB — HIV ANTIBODY (ROUTINE TESTING W REFLEX): HIV Screen 4th Generation wRfx: NONREACTIVE

## 2024-01-24 MED ORDER — NICOTINE 21 MG/24HR TD PT24
21.0000 mg | MEDICATED_PATCH | Freq: Every day | TRANSDERMAL | Status: DC
  Administered 2024-01-24 – 2024-01-26 (×3): 21 mg via TRANSDERMAL
  Filled 2024-01-24 (×3): qty 1

## 2024-01-24 MED ORDER — LIDOCAINE 5 % EX PTCH
1.0000 | MEDICATED_PATCH | CUTANEOUS | Status: DC
  Administered 2024-01-24 – 2024-01-25 (×2): 1 via TRANSDERMAL
  Filled 2024-01-24 (×2): qty 1

## 2024-01-24 NOTE — Plan of Care (Signed)
   Problem: Safety: Goal: Ability to remain free from injury will improve Outcome: Progressing   Problem: Skin Integrity: Goal: Risk for impaired skin integrity will decrease Outcome: Progressing

## 2024-01-24 NOTE — Progress Notes (Signed)
 Patient arrived in the unit on bed accompanied by RN

## 2024-01-24 NOTE — Progress Notes (Signed)
 HD#0 SUBJECTIVE:  Patient Summary: Jimmy RAINES Sr. is a 58 y.o. 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   Overnight Events: He was bradycardic and unresponsive, CODE BLUE was called.  However code was then called off as he was able to wake up.  Interim History: He feels like his chest pain has improved, he remembers that last night he was sleeping and then when when he woke up he saw many people in his room.  He denies any nausea and has been able to eat.  He is not short of breath at this time.  He has been coughing up blood.  OBJECTIVE:  Vital Signs: Vitals:   01/24/24 0537 01/24/24 0829 01/24/24 0957 01/24/24 1045  BP: 100/76 95/73 98/75  100/70  Pulse: 98 94 98 100  Resp: 16 16    Temp: 98 F (36.7 C) 97.8 F (36.6 C)    TempSrc: Oral Axillary    SpO2: 95% 99%  99%  Weight:      Height:       Supplemental O2: Nasal Cannula SpO2: 99 % O2 Flow Rate (L/min): 1 L/min  Filed Weights   01/23/24 1214 01/24/24 0500  Weight: 65.8 kg 66.2 kg    No intake or output data in the 24 hours ending 01/24/24 1148 Net IO Since Admission: No IO data has been entered for this period [01/24/24 1148]  Physical Exam: Physical Exam Constitutional:      General: He is not in acute distress.    Appearance: He is ill-appearing (chronically).  Cardiovascular:     Heart sounds: Heart sounds are distant.  Pulmonary:     Effort: Pulmonary effort is normal. No tachypnea or respiratory distress.     Breath sounds: Examination of the right-upper field reveals decreased breath sounds. Examination of the left-upper field reveals decreased breath sounds. Examination of the right-middle field reveals decreased breath sounds. Examination of the left-middle field reveals decreased breath sounds.  Examination of the right-lower field reveals decreased breath sounds. Examination of the left-lower field reveals decreased breath sounds. Decreased breath sounds present. No wheezing, rhonchi or rales.  Chest:     Chest wall: No tenderness.  Abdominal:     General: Bowel sounds are normal. There is no abdominal bruit.     Tenderness: There is no abdominal tenderness.  Musculoskeletal:     Right lower leg: No edema.     Left lower leg: No edema.  Skin:    Capillary Refill: Capillary refill takes less than 2 seconds.     Coloration: Skin is not cyanotic.  Neurological:     Mental Status: He is alert and oriented to person, place, and time.    Patient Lines/Drains/Airways Status     Active Line/Drains/Airways     Name Placement date Placement time Site Days   Peripheral IV 01/23/24 20 G 1.88" Right;Lateral Forearm 01/23/24  1330  Forearm  1   Sheath 05/05/19 Right Venous 05/05/19  0305  Venous  1725   Sheath 05/05/19 Right Arterial 05/05/19  0307  Arterial  1725   Wound 12/30/12 Skin tear Arm Right;Left multiple skin tears on BUE 12/30/12  1525  Arm  4042            ASSESSMENT/PLAN:  Assessment: Principal Problem:  Pulmonary emboli (HCC) Active Problems:   Hypotension due to hypovolemia   Plan:  Jimmy June Sr. is a 58 y.o. 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   Pulmonary emboli  on the posterior segment of the right upper lung, anterior segment of the right upper lung, left upper lung Had some hemoptysis overnight which is expected.  Echo revealed an anteroapical aneurysm and a thrombus was not able to be ruled out the left ventricular ejection fraction is 20 to 25% which is severely decreased.  There is dilatation of the left  ventricular cavity.  There is normal right ventricular systolic function, there is no right heart strain.  Will continue him on anticoagulation, with Lovenox every 12 hours with plans to transition him to a DOAC at a later time.  Appreciate PCCM, he does not seem to be having a pneumonia on x-ray findings.  White blood cell count remains stable WNL.  He has remained afebrile.  - lovenox q12 hrs  - Continue with telemetry - Tylenol  as needed for pain  Hepatitis A He is unsure where he got the hepatitis A from.  But IgM is positive.  He denies any abdominal pain, had some nausea overnight, but no diarrhea.  Will continue with Zofran  as needed q8 hours with caution .  He is currently on telemetry.  Transaminases are improving. -Continue with Zofran  as needed  Hx of alcohol abuse  States that he had a bottle of fifth on Sunday before that he had not drank in about 20 years. He may benefit from starting naltrexone in the future.   -cw Thiamine low dose daily  -Continue with folic acid supplementation -daily MV    HFrEF Ejection fraction now has decreased to 20 to 25% with severe left diastolic dysfunction.  May also have an aneurysm.  This could not be ruled out.  Currently taking Lipitor  80 mg, Toprol  50 mg twice daily, nitroglycerin  as needed, Entresto  49-51 mg twice daily.  His creatinine seems to be stable at baseline.  He is currently euvolemic but we will hold off blood pressure medications given that he was hypotensive overnight.  Will also hold off the metoprolol  since he was bradycardic overnight.  -hold metoprolol  50 mg twice daily -hold Lipitor  80 mg daily -hold Entresto  49-50 mg twice daily -Echo  -I/Os -Standing weights only    HLD  CAD  With ICD placed.  On ezetimibe  10 mg daily and atorvastatin  80 mg daily.  Will hold this given LFT elevations.   History of seizures Depakote level is less than 10.  States that he has been taking his Depakote.  His last stroke was about 6  months ago.  He denies it being secondary to his alcohol use.  Dates that these started after he was given varenicline for smoking cessation.  His PCP prescribes his Depakote.  Given that his LFTs are improving, we will continue on the valproate 250 mg in the morning and 500 mg at bedtime.  He will need outpatient follow-up.   Anemia On iron supplementation.  Unsure if he is up-to-date on colonoscopy.   Back pain States that he has recently been diagnosed with scoliosis.  He usually takes gabapentin 600 mg 3 times daily.  We will  continue with the same.   COPD Seems like he is using 2.5 L at baseline.  We have been monitoring his oxygen  requirements, and he has been able to go down to room air keeping saturation is in the high 90s.  Will continue to monitor oxygen  requirements but he may not need oxygen  anymore.  Goal oxygenation requirements for person with COPD is 88 to 92%.  He is on Breztri, seems that at some point he was on montelukast 10 mg daily but he has not dispensed since his November 2024.  He will benefit from quitting smoking. -Continue with Breztri  -albuterol  prn -Duoneb q6PRN   Hypertension On ivabradine  5 mg BID.  Will hold this for now.   Mood disorder On 150 mg of sertraline daily.  Will continue with the same.   Diet: Heart Healthy VTE: Lovenox IVF: None, Code: Full DISPO: Anticipated discharge in 1-2 days to Home pending  medical workup .  Signature: [facility]  Internal Medicine Resident, PGY-1 [facility]  11:48 AM, 01/24/2024   Please contact the on call pager [phone] for urgent or emergent concerns.

## 2024-01-24 NOTE — TOC CAGE-AID Note (Signed)
 Transition of Care (TOC) - CAGE-AID Screening   Patient Details  Name: Jimmy Nelson Sr. MRN: [id number] Date of Birth: [birth year]  Transition of Care [facility]) CM/SW Contact:    Jonathan Neighbor, RN Phone Number: 01/24/2024, 3:32 PM   Clinical Narrative:  CM provided inpatient/ outpatient alcohol and drug counseling resource.    CAGE-AID Screening:

## 2024-01-24 NOTE — Telephone Encounter (Signed)
 Patient Product/process development scientist completed.    The patient is insured through [facility]. Patient has Medicare and is not eligible for a copay card, but may be able to apply for patient assistance or Medicare RX Payment Plan (Patient Must reach out to their plan, if eligible for payment plan), if available.    Ran test claim for Eliquis 5 mg and the current 30 day co-pay is $0.00.  Ran test claim for Xarelto 20 mg and the current 30 day co-pay is $0.00.  This test claim was processed through [facility]- copay amounts may vary at other pharmacies due to pharmacy/plan contracts, or as the patient moves through the different stages of their insurance plan.     Roland Earl, CPHT Pharmacy Technician III Certified Patient Advocate [facility] Patient Advocate Team Direct Number: [phone]  Fax: [phone]

## 2024-01-24 NOTE — Evaluation (Signed)
 Occupational Therapy Evaluation Patient Details Name: Jimmy BARENTINE Sr. MRN: [id number] DOB: 1966-03-21 Today's Date: 01/24/2024   History of Present Illness   Pt is a 58 y/o M presenting to ED on 4/28 with midsternal chest pain, found to have segmental PE on R upper lung, and started on heparin  > lovenox 4/28. Code blue 4/28 for bradycardia, but then called off as pt able to wake up. PMH includes 2 prior MI's s/p ICD placement, CVA, CHF, HFrEF, lung cancer     Clinical Impressions Pt ind at baseline with ADL/cane for functional mobility, lives with his fiance who can assist at d/c. Pt currently needing up to min A for ADLs, mod I for bed mobility and min A for transfers with RW. Pt endorses recent falls and noted to have scab on lip from recent fall per report. SpO2 in mid-high 90s on RA throughout session. Pt presenting with impairments listed below, will follow acutely. Recommend HHOT at d/c.      If plan is discharge home, recommend the following:   A little help with walking and/or transfers;A lot of help with bathing/dressing/bathroom;Assistance with cooking/housework;Direct supervision/assist for medications management;Direct supervision/assist for financial management;Assist for transportation;Help with stairs or ramp for entrance     Functional Status Assessment   Patient has had a recent decline in their functional status and demonstrates the ability to make significant improvements in function in a reasonable and predictable amount of time.     Equipment Recommendations   Other (comment) (RW)     Recommendations for Other Services   PT consult     Precautions/Restrictions         Mobility Bed Mobility Overal bed mobility: Modified Independent                  Transfers Overall transfer level: Needs assistance Equipment used: Rolling walker (2 wheels), Straight cane Transfers: Sit to/from Stand Sit to Stand: Min assist           General  transfer comment: initially using cane with incr sway, min A with RW for ambulation to bathroom      Balance Overall balance assessment: Needs assistance, History of Falls Sitting-balance support: Feet supported Sitting balance-Leahy Scale: Good     Standing balance support: Bilateral upper extremity supported, During functional activity, Reliant on assistive device for balance Standing balance-Leahy Scale: Poor Standing balance comment: RW in standing                           ADL either performed or assessed with clinical judgement   ADL Overall ADL's : Needs assistance/impaired Eating/Feeding: Set up;Sitting   Grooming: Wash/dry hands;Standing;Contact guard assist   Upper Body Bathing: Minimal assistance;Sitting   Lower Body Bathing: Minimal assistance;Sitting/lateral leans   Upper Body Dressing : Minimal assistance;Sitting   Lower Body Dressing: Minimal assistance;Sitting/lateral leans   Toilet Transfer: Minimal assistance;Ambulation;Regular Toilet;Rolling walker (2 wheels)   Toileting- Clothing Manipulation and Hygiene: Supervision/safety       Functional mobility during ADLs: Minimal assistance;Rolling walker (2 wheels)       Vision Baseline Vision/History: 1 Wears glasses Vision Assessment?: No apparent visual deficits     Perception Perception: Not tested       Praxis Praxis: Not tested       Pertinent Vitals/Pain       Extremity/Trunk Assessment Upper Extremity Assessment Upper Extremity Assessment: Generalized weakness;Left hand dominant;RUE deficits/detail RUE Deficits / Details: global weakness 4/5, hx CVA  RUE Coordination: decreased fine motor;decreased gross motor   Lower Extremity Assessment Lower Extremity Assessment: Defer to PT evaluation   Cervical / Trunk Assessment Cervical / Trunk Assessment: Kyphotic   Communication Communication Communication: No apparent difficulties   Cognition Arousal: Alert Behavior During  Therapy: WFL for tasks assessed/performed Cognition: No family/caregiver present to determine baseline             OT - Cognition Comments: pt follows commands, noted some slowed processing with questions asked/response time                 Following commands: Intact       Cueing  General Comments   Cueing Techniques: Verbal cues  SpO2 in mid-high 90s throughout on RA   Exercises     Shoulder Instructions      Home Living Family/patient expects to be discharged to:: Private residence Living Arrangements: Spouse/significant other Available Help at Discharge: Family;Available 24 hours/day Type of Home: House Home Access: Stairs to enter [employer] of Steps: 3 Entrance Stairs-Rails: None Home Layout: One level     Bathroom Shower/Tub: Chief Strategy Officer: Standard Bathroom Accessibility: Yes   Home Equipment: Cane - single point;Shower seat;Grab bars - tub/shower;Hand held shower head   Additional Comments: reports wearing 2.5L O2 baseline, ?sometimes only at night      Prior Functioning/Environment Prior Level of Function : Independent/Modified Independent;Needs assist;Driving;History of Falls (last six months);Patient poor historian/Family not available (Pt reports 4 falls over the last 6 months. Pt reports that he drives a moped.)             Mobility Comments: Pt report mostly Mod I with hurrycane however sometimes needs assistance when having back pain from scoliosis. ADLs Comments: Pt reports fiance helps with medication management, ind with ADL,moped for transportation    OT Problem List: Decreased strength;Decreased range of motion;Decreased activity tolerance;Impaired balance (sitting and/or standing);Decreased safety awareness   OT Treatment/Interventions: Self-care/ADL training;Therapeutic exercise;Energy conservation;DME and/or AE instruction;Therapeutic activities;Balance training;Patient/family education       OT Goals(Current goals can be found in the care plan section)   Acute Rehab OT Goals Patient Stated Goal: none stated OT Goal Formulation: With patient Time For Goal Achievement: 02/07/24 Potential to Achieve Goals: Good ADL Goals Pt Will Perform Grooming: Independently;standing Pt Will Perform Upper Body Dressing: Independently;sitting;standing Pt Will Perform Lower Body Dressing: Independently;sitting/lateral leans;sit to/from stand Pt Will Transfer to Toilet: Independently;ambulating;regular height toilet Pt Will Perform Tub/Shower Transfer: Tub transfer;Shower transfer;Independently;shower seat   OT Frequency:  Min 2X/week    Co-evaluation              AM-PAC OT "6 Clicks" Daily Activity     Outcome Measure Help from another person eating meals?: None Help from another person taking care of personal grooming?: A Little Help from another person toileting, which includes using toliet, bedpan, or urinal?: A Little Help from another person bathing (including washing, rinsing, drying)?: A Lot Help from another person to put on and taking off regular upper body clothing?: A Little Help from another person to put on and taking off regular lower body clothing?: A Lot 6 Click Score: 17   End of Session Equipment Utilized During Treatment: Gait belt;Rolling walker (2 wheels) Nurse Communication: Mobility status  Activity Tolerance: Patient tolerated treatment well Patient left: in chair;with call bell/phone within reach (no chair alarm box, RN aware)  OT Visit Diagnosis: Unsteadiness on feet (R26.81);Other abnormalities of gait and mobility (R26.89);Muscle weakness (generalized) (  M62.81);History of falling (Z91.81)                Time: 1610-9604 OT Time Calculation (min): 25 min Charges:  OT General Charges $OT Visit: 1 Visit OT Evaluation $OT Eval Moderate Complexity: 1 Mod  Sufian Ravi K, OTD, OTR/L SecureChat Preferred Acute Rehab [phone]   Benedict Brain  K[place]ce 01/24/2024, 2:58 PM

## 2024-01-24 NOTE — Evaluation (Signed)
 Physical Therapy Evaluation Patient Details Name: Jimmy MONTAQUE Sr. MRN: [id number] DOB: [birth year] Today's Date: 01/24/2024  History of Present Illness  Pt is a 58 y/o M presenting to ED on 4/28 with midsternal chest pain, found to have segmental PE on R upper lung, and started on heparin  > lovenox 4/28. Code blue 4/28 for bradycardia, but then called off as pt able to wake up. PMH includes 2 prior MI's s/p ICD placement, CVA, CHF, HFrEF, lung cancer  Clinical Impression  Pt presents with admitting diagnosis above. Co-treat with OT. Pt today was able to ambulate around unit and navigate stairs with RW CGA. PTA pt reports he was mostly mod I with a hurrycane however would require assistance from fiance when he had back pain from scoliosis. Pt initially ambulated Min A with OT to restroom however once given RW pt was able to progress to CGA/supervision. Pt does endorse multiple falls PTA. Recommend HHPT upon DC with RW. PT will continue to follow.       If plan is discharge home, recommend the following: A little help with walking and/or transfers;A little help with bathing/dressing/bathroom;Assistance with cooking/housework;Direct supervision/assist for medications management;Assist for transportation;Help with stairs or ramp for entrance   Can travel by private vehicle        Equipment Recommendations Rolling walker (2 wheels)  Recommendations for Other Services       Functional Status Assessment Patient has had a recent decline in their functional status and demonstrates the ability to make significant improvements in function in a reasonable and predictable amount of time.     Precautions / Restrictions Precautions Precautions: Fall Restrictions Weight Bearing Restrictions Per Provider Order: No      Mobility  Bed Mobility Overal bed mobility: Modified Independent                  Transfers Overall transfer level: Needs assistance Equipment used: Rolling walker (2  wheels), Straight cane Transfers: Sit to/from Stand Sit to Stand: Min assist           General transfer comment: initially using cane with incr sway, min A with RW for ambulation to bathroom    Ambulation/Gait Ambulation/Gait assistance: Min assist, Contact guard assist Gait Distance (Feet): 250 Feet Assistive device: Rolling walker (2 wheels) Gait Pattern/deviations: Trunk flexed, Drifts right/left, Decreased stride length, Step-through pattern Gait velocity: decreased     General Gait Details: Initally Min A to ambulate to bathroom with OT however pt was able to progress to CGA once out in hallway. Cues for proximity to RW and safety.  Stairs Stairs: Yes Stairs assistance: Contact guard assist Stair Management: Two rails, Alternating pattern, Forwards Number of Stairs: 2 General stair comments: no LOB noted.  Wheelchair Mobility     Tilt Bed    Modified Rankin (Stroke Patients Only)       Balance Overall balance assessment: Needs assistance, History of Falls Sitting-balance support: Feet supported Sitting balance-Leahy Scale: Good     Standing balance support: Bilateral upper extremity supported, During functional activity, Reliant on assistive device for balance Standing balance-Leahy Scale: Poor Standing balance comment: RW in standing                             Pertinent Vitals/Pain Pain Assessment Pain Assessment: 0-10 Pain Score: 6  Pain Location: Upper stomach pain Pain Descriptors / Indicators: Constant, Sore Pain Intervention(s): Monitored during session    Home Living Family/patient expects  to be discharged to:: Private residence Living Arrangements: Spouse/significant other Available Help at Discharge: Family;Available 24 hours/day Type of Home: House Home Access: Stairs to enter Entrance Stairs-Rails: None Entrance Stairs-Number of Steps: 3   Home Layout: One level Home Equipment: Cane - single point;Shower seat;Grab bars -  tub/shower;Hand held shower head Additional Comments: reports wearing 2.5L O2 baseline, ?sometimes only at night    Prior Function Prior Level of Function : Independent/Modified Independent;Needs assist;Driving;History of Falls (last six months);Patient poor historian/Family not available (Pt reports 4 falls over the last 6 months. Pt reports that he drives a moped.)             Mobility Comments: Pt report mostly Mod I with hurrycane however sometimes needs assistance when having back pain from scoliosis. ADLs Comments: Pt reports fiance helps with medication management, ind with ADL,moped for transportation     Extremity/Trunk Assessment   Upper Extremity Assessment Upper Extremity Assessment: Generalized weakness RUE Deficits / Details: global weakness 4/5, hx CVA RUE Coordination: decreased fine motor;decreased gross motor    Lower Extremity Assessment Lower Extremity Assessment: Generalized weakness (Pt reports neuropathy in B feet)    Cervical / Trunk Assessment Cervical / Trunk Assessment: Kyphotic  Communication   Communication Communication: No apparent difficulties    Cognition Arousal: Alert Behavior During Therapy: WFL for tasks assessed/performed                             Following commands: Intact       Cueing Cueing Techniques: Verbal cues     General Comments General comments (skin integrity, edema, etc.): VSS on RA    Exercises     Assessment/Plan    PT Assessment Patient needs continued PT services  PT Problem List Decreased strength;Decreased range of motion;Decreased activity tolerance;Decreased balance;Decreased mobility;Decreased coordination;Decreased knowledge of use of DME;Decreased safety awareness;Decreased knowledge of precautions;Cardiopulmonary status limiting activity       PT Treatment Interventions DME instruction;Stair training;Gait training;Functional mobility training;Therapeutic activities;Therapeutic  exercise;Balance training;Neuromuscular re-education;Patient/family education    PT Goals (Current goals can be found in the Care Plan section)  Acute Rehab PT Goals Patient Stated Goal: to go home PT Goal Formulation: With patient Time For Goal Achievement: 02/07/24 Potential to Achieve Goals: Good    Frequency Min 3X/week     Co-evaluation               AM-PAC PT "6 Clicks" Mobility  Outcome Measure Help needed turning from your back to your side while in a flat bed without using bedrails?: A Little Help needed moving from lying on your back to sitting on the side of a flat bed without using bedrails?: A Little Help needed moving to and from a bed to a chair (including a wheelchair)?: A Little Help needed standing up from a chair using your arms (e.g., wheelchair or bedside chair)?: A Little Help needed to walk in hospital room?: A Little Help needed climbing 3-5 steps with a railing? : A Little 6 Click Score: 18    End of Session Equipment Utilized During Treatment: Gait belt Activity Tolerance: Patient tolerated treatment well Patient left: in chair;with call bell/phone within reach;Other (comment) (No chair alarm in room. RN made aware.) Nurse Communication: Mobility status PT Visit Diagnosis: Other abnormalities of gait and mobility (R26.89)    Time: 1610-9604 PT Time Calculation (min) (ACUTE ONLY): 25 min   Charges:   PT Evaluation $PT Eval Moderate Complexity:  1 Mod   PT General Charges $$ ACUTE PT VISIT: 1 Visit         Rodgers Clack, PT, DPT Acute Rehab Services [phone]   Jatavia Keltner 01/24/2024, 4:16 PM

## 2024-01-25 ENCOUNTER — Encounter (HOSPITAL_COMMUNITY): Payer: Self-pay | Admitting: Internal Medicine

## 2024-01-25 ENCOUNTER — Telehealth (HOSPITAL_COMMUNITY): Payer: Self-pay

## 2024-01-25 ENCOUNTER — Other Ambulatory Visit (HOSPITAL_COMMUNITY): Payer: Self-pay

## 2024-01-25 DIAGNOSIS — I2694 Multiple subsegmental pulmonary emboli without acute cor pulmonale: Secondary | ICD-10-CM | POA: Diagnosis not present

## 2024-01-25 DIAGNOSIS — R079 Chest pain, unspecified: Secondary | ICD-10-CM | POA: Diagnosis not present

## 2024-01-25 LAB — CBC
HCT: 48.1 % (ref 39.0–52.0)
Hemoglobin: 15.2 g/dL (ref 13.0–17.0)
MCH: 27.9 pg (ref 26.0–34.0)
MCHC: 31.6 g/dL (ref 30.0–36.0)
MCV: 88.3 fL (ref 80.0–100.0)
Platelets: 192 10*3/uL (ref 150–400)
RBC: 5.45 MIL/uL (ref 4.22–5.81)
RDW: 14 % (ref 11.5–15.5)
WBC: 7 10*3/uL (ref 4.0–10.5)
nRBC: 0 % (ref 0.0–0.2)

## 2024-01-25 LAB — COMPREHENSIVE METABOLIC PANEL WITH GFR
ALT: 82 U/L — ABNORMAL HIGH (ref 0–44)
AST: 46 U/L — ABNORMAL HIGH (ref 15–41)
Albumin: 3.5 g/dL (ref 3.5–5.0)
Alkaline Phosphatase: 155 U/L — ABNORMAL HIGH (ref 38–126)
Anion gap: 10 (ref 5–15)
BUN: 10 mg/dL (ref 6–20)
CO2: 24 mmol/L (ref 22–32)
Calcium: 8.8 mg/dL — ABNORMAL LOW (ref 8.9–10.3)
Chloride: 102 mmol/L (ref 98–111)
Creatinine, Ser: 1.38 mg/dL — ABNORMAL HIGH (ref 0.61–1.24)
GFR, Estimated: 60 mL/min — ABNORMAL LOW (ref >60)
Glucose, Bld: 69 mg/dL — ABNORMAL LOW (ref 70–99)
Potassium: 4.2 mmol/L (ref 3.5–5.1)
Sodium: 136 mmol/L (ref 135–145)
Total Bilirubin: 0.8 mg/dL (ref 0.0–1.2)
Total Protein: 7 g/dL (ref 6.5–8.1)

## 2024-01-25 LAB — TROPONIN I (HIGH SENSITIVITY): Troponin I (High Sensitivity): 8 ng/L (ref <18)

## 2024-01-25 LAB — HEPATITIS PANEL, ACUTE
HCV Ab: NONREACTIVE
Hep A IgM: REACTIVE — AB
Hep B C IgM: NONREACTIVE
Hepatitis B Surface Ag: NONREACTIVE

## 2024-01-25 LAB — MAGNESIUM: Magnesium: 2.2 mg/dL (ref 1.7–2.4)

## 2024-01-25 MED ORDER — DICYCLOMINE HCL 10 MG PO CAPS
10.0000 mg | ORAL_CAPSULE | Freq: Once | ORAL | Status: AC
  Administered 2024-01-26: 10 mg via ORAL
  Filled 2024-01-25: qty 1

## 2024-01-25 MED ORDER — APIXABAN 5 MG PO TABS: 5.0000 mg | ORAL_TABLET | Freq: Two times a day (BID) | ORAL | Status: DC

## 2024-01-25 MED ORDER — APIXABAN 5 MG PO TABS
10.0000 mg | ORAL_TABLET | Freq: Two times a day (BID) | ORAL | Status: DC
  Administered 2024-01-25 – 2024-01-26 (×2): 10 mg via ORAL
  Filled 2024-01-25 (×2): qty 2

## 2024-01-25 MED ORDER — HYDROMORPHONE HCL 2 MG PO TABS
1.0000 mg | ORAL_TABLET | Freq: Once | ORAL | Status: AC
  Administered 2024-01-25: 1 mg via ORAL
  Filled 2024-01-25: qty 1

## 2024-01-25 MED ORDER — FAMOTIDINE 20 MG PO TABS
20.0000 mg | ORAL_TABLET | Freq: Every day | ORAL | Status: DC
  Administered 2024-01-25: 20 mg via ORAL
  Filled 2024-01-25: qty 1

## 2024-01-25 MED ORDER — METOPROLOL SUCCINATE ER 25 MG PO TB24
12.5000 mg | ORAL_TABLET | Freq: Every day | ORAL | Status: DC
  Administered 2024-01-25 – 2024-01-26 (×2): 12.5 mg via ORAL
  Filled 2024-01-25 (×2): qty 1

## 2024-01-25 MED ORDER — ACETAMINOPHEN 325 MG PO TABS
650.0000 mg | ORAL_TABLET | Freq: Three times a day (TID) | ORAL | Status: DC
  Administered 2024-01-26: 650 mg via ORAL
  Filled 2024-01-25 (×3): qty 2

## 2024-01-25 MED ORDER — ALUM & MAG HYDROXIDE-SIMETH 200-200-20 MG/5ML PO SUSP
30.0000 mL | Freq: Once | ORAL | Status: AC
  Administered 2024-01-26: 30 mL via ORAL
  Filled 2024-01-25: qty 30

## 2024-01-25 NOTE — Progress Notes (Signed)
 Heart Failure Nurse Navigator Progress Note  PCP: Barbar Levine, MD PCP-Cardiologist: [place] Admission Diagnosis: chest pain, Multiple subsegmental pulmonary emboli Admitted from: Home via [facility]  Presentation:   Jimmy June Sr. presented with chest pain while sitting in court and then he started sweating, shortness of breath and nausea. BP 94/76, HR 64, D-dimer 16.66, EKG with sinus tachycardia, chest x-ray  significant for emphysematous changes and pulmonary scarring, no acute superimposed pulmonary process , VQ scan, revealed two or greater segmental perfusion defects in the lungs consistent with high probability of pulmonary embolus. On 01/24/24 PM he was bradycardiac and unresponsive, CODE BLUE called, then called off as he woke up.   Patient was educated on the sign and symptoms of heart failure, daily weights, when to call his doctor or go to the ED, ( a scale was brought to bedside for patient to use at home) . Diet/ fluid restrictions were educated on. Found patient in room with (4) 2 litter bottles of Mt. Dew and a huge bag of Doritos chips at his bedside. Patient reported that he usually drinks about (3) 2 litter bottles daily and has been drinking Mt. Dew since he was 12. ) Continued education on the importance of limiting his salt intake, taking his medications as prescribed and attending all medical appointments. Patient verbalized his understanding, a HF TOC appointment was scheduled for 01/31/2024 @ 2:45 pm. Patient reported he would have his son drive him so that he can hear too.   ECHO/ LVEF: 20-25%  Clinical Course:  Past Medical History:  Diagnosis Date   Acute anterolateral myocardial infarction (HCC) 05/05/2019   Acute MI, anterolateral wall, initial episode of care (HCC) 05/05/2019   S/P Prox LAD 3.5x20 and 3.0x12 Synergy 100% to 0% on 05/05/2019     Alcohol abuse 12/30/2012   Adequate for discharge     Amnesia    Anemia    Anxiety and depression     Chronic systolic heart failure (HCC) 04/14/2020   Congestive heart failure, hypertensive (HCC)    COPD (chronic obstructive pulmonary disease) (HCC)    Crohn's disease (HCC) Deteriorating disk   Crohn's disease (HCC)    Depression    Encounter for assessment of implantable cardioverter-defibrillator (ICD) 04/14/2020   Esophageal reflux 01/20/2013   GERD (gastroesophageal reflux disease)    History of lung cancer    History of MI (myocardial infarction) 05/05/2019   History of stroke    2   Hyperlipidemia    Hypertension    Hypogonadism in male    ICD - Single chamber [employer] GALLANT VR ICD 09/10/2019 09/10/2019   Kidney disease 01/21/2022   chronic   Lung cancer (HCC)    Major depressive disorder    Mental disorder    Neuropathy    Nonischemic cardiomyopathy (HCC) 08/22/2019   Oxygen  dependent    Rheumatoid arthritis of left wrist (HCC)    Seizure (HCC)    No recent seizures; continue Depakote   Sleep apnea    Stage 3 chronic kidney disease (HCC)    Stroke (HCC)    Tobacco use disorder, continuous 05/05/2019     Social History   Socioeconomic History   Marital status: Significant Other    Spouse name: Not on file   Number of children: 5   Years of education: Not on file   Highest education level: Not on file  Occupational History   Not on file  Tobacco Use   Smoking status: Every Day  Types: Cigarettes   Smokeless tobacco: Current    Types: Snuff   Tobacco comments:    3 cigarettes daily   Vaping Use   Vaping status: Never Used  Substance and Sexual Activity   Alcohol use: Yes    Comment: Occasional   Drug use: Yes    Types: Cocaine, Marijuana    Comment: Former Cocaine user   Sexual activity: Yes  Other Topics Concern   Not on file  Social History Narrative   ** Merged History Encounter **       Social Drivers of Health   Financial Resource Strain: High Risk (05/31/2019)   Overall Financial Resource Strain (CARDIA)    Difficulty of Paying  Living Expenses: Hard  Food Insecurity: No Food Insecurity (01/23/2024)   Hunger Vital Sign    Worried About Running Out of Food in the Last Year: Never true    Ran Out of Food in the Last Year: Never true  Transportation Needs: No Transportation Needs (01/23/2024)   PRAPARE - Administrator, Civil Service (Medical): No    Lack of Transportation (Non-Medical): No  Physical Activity: Not on file  Stress: Not on file  Social Connections: Not on file   Education Assessment and Provision:  Detailed education and instructions provided on heart failure disease management including the following:  Signs and symptoms of Heart Failure When to call the physician Importance of daily weights Low sodium diet Fluid restriction Medication management Anticipated future follow-up appointments  Patient education given on each of the above topics.  Patient acknowledges understanding via teach back method and acceptance of all instructions.  Education Materials:  "Living Better With Heart Failure" Booklet, HF zone tool, & Daily Weight Tracker Tool.  Patient has scale at home: No, will bring  him one to bedside.  Patient has pill box at home: yes    High Risk Criteria for Readmission and/or Poor Patient Outcomes: Heart failure hospital admissions (last 6 months): 0  No Show rate: 13% Difficult social situation: No, lives with his fiancee Demonstrates medication adherence: yes Primary Language: English Literacy level: 9 th grade, reading, writing, and comprehension.   Barriers of Care:   Diet/ fluid restrictions ( drinks (3) 2 litter [place]. Dew bottles daily. Salty snacks Daily weights Continued HF education  Considerations/Referrals:   Referral made to Heart Failure Pharmacist Stewardship: yes Referral made to Heart Failure CSW/NCM TOC: No Referral made to Heart & Vascular TOC clinic: Yes, 01/31/2024 @ 2:45 pm.   Items for Follow-up on DC/TOC: Continued HF education Diet/ fluid  restrictions/ daily weights   Randie Bustle, BSN, RN Heart Failure Teacher, adult education Only

## 2024-01-25 NOTE — Plan of Care (Signed)

## 2024-01-25 NOTE — Discharge Instructions (Addendum)
 Mr Kouki, Holzapfel were admitted in the hospital because you presented with chest pain and shortness of breath.  You were found to have clots in your lungs that were causing your pain.  You do have a history of heart failure with a reduced ejection fraction meaning that your blood is pulling around your heart can predispose you to clots. Immobility can also increase the risk of clots.  You have started on blood thinners called apixaban .  You will need to take 10 mg twice daily until 02/01/2024 and on 02/02/2024 you will then switch to 5 mg twice a day.  You will need to be on this medication indefinitely.  I am giving you a 30-day supply, but then you will need to follow-up with your PCP for refills. If you stop taking this medication, your risk of clots will increase.   Your chest pain may be caused by several reasons.  One of the reasons may be because you do have a history of gastric reflux.  We have increased your Pepcid  to 40 mg twice a day, and started you on a medication called famotidine  20 mg which you will need to take at bedtime.  Please avoid taking the Protonix  and the famotidine  together.  You can take the Protonix  with your dinner, and then the famotidine  right before bedtime.  Make sure that you stay upright for at least 30 minutes after eating so that your food does not pull in the back of your esophagus and causes the burning that you are feeling.  You may also be feeling some chest pain because of the clots that you have in your lungs.  You will need to follow-up with your doctor.  For pain, you can schedule Tylenol  500 mg every 8 hours until your pain gets better and then take it as needed.  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 You will need to follow-up with your primary care doctor so that they can check your blood and see how your liver function is doing.  If your liver function recovers you are able to  take a higher dose of Tylenol  (up to 1,000mg  every 6 hours daily as needed).   The chest pain that you have been experiencing during her hospitalization has been different according to what you have been telling us . Please do come back to the emergency department if your chest pain does not resolve and it is more like the chest pain that you had when you had a prior heart attack for further evaluation.  You will need continued monitoring from your primary care doctor to make sure that the clots in your lungs are not affecting your heart.  Please follow-up with your primary care doctor within a week of this hospitalization so that they can check your blood and restart some of your medications.  Please follow-up the instructions on the medication list that we are providing you.  We have held all of your blood pressure medications and you are currently only on metoprolol  for your blood pressure and heart failure.  The metoprolol  dose that you are taking at home is higher than the dose that we are discharging you right now because your heart rate and your blood pressures have been low. This may be what was causing your dizziness. You do have an appointment with cardiology on 01/31/2024 at 2:45 PM  You will also need to  follow-up with your primary care doctor with regards to the vitamins that you are taking.  I am discharging you on a multivitamin and you re also on other vitamins over the counter.  I do think that you may benefit from just having less pills and taking just 1 multivitamin may be best, but please make sure to follow-up with your primary care doctor about this.  You did mention that you have a history of seizures that started after you were taking varenicline.  You did say that you are taking Depakote .  We have restarted this medication for you but it seems that you have not been picking it up from the pharmacy.  Given your history of strokes and alcohol use, we have started you on the Depakote  but  you will need to follow-up with your PCP and see if they would consider referral to neurology if you continue having seizures despite discontinuing the varenicline.    With regards to your COPD, please take your Breztri .  We have measured your oxygenation levels during your hospital stay and even though you have clots in your lungs you have been able to maintain your oxygenation above goal without oxygen .  You did not require home oxygen  anymore.  Please take albuterol  and duonebs as needed if you develop shortness of breath and wheezing for your COPD.  If you continue being short of breath, please follow-up with your PCP.  I strongly encourage you to discontinue smoking.  Thank you for allowing us  to take care of you during your hospital stay.  Sincerely,  Your internal medicine team  ------------------------------------------------------------------------------------------------- Information on my medicine - ELIQUIS  (apixaban )  This medication education was reviewed with me or my healthcare representative as part of my discharge preparation.    Why was Eliquis  prescribed for you? Eliquis  was prescribed to treat blood clots that may have been found in the veins of your legs (deep vein thrombosis) or in your lungs (pulmonary embolism) and to reduce the risk of them occurring again.  What do You need to know about Eliquis  ? The starting dose is 10 mg (two 5 mg tablets) taken TWICE daily for the FIRST SEVEN (7) DAYS, then on 02/01/2024  the dose is reduced to ONE 5 mg tablet taken TWICE daily.  Eliquis  may be taken with or without food.   Try to take the dose about the same time in the morning and in the evening. If you have difficulty swallowing the tablet whole please discuss with your pharmacist how to take the medication safely.  Take Eliquis  exactly as prescribed and DO NOT stop taking Eliquis  without talking to the doctor who prescribed the medication.  Stopping may increase your  risk of developing a new blood clot.  Refill your prescription before you run out.  After discharge, you should have regular check-up appointments with your healthcare provider that is prescribing your Eliquis .    What do you do if you miss a dose? If a dose of ELIQUIS  is not taken at the scheduled time, take it as soon as possible on the same day and twice-daily administration should be resumed. The dose should not be doubled to make up for a missed dose.  Important Safety Information A possible side effect of Eliquis  is bleeding. You should call your healthcare provider right away if you experience any of the following: Bleeding from an injury or your nose that does not stop. Unusual colored urine (red or dark brown) or unusual colored stools (red  or black). Unusual bruising for unknown reasons. A serious fall or if you hit your head (even if there is no bleeding).  Some medicines may interact with Eliquis  and might increase your risk of bleeding or clotting while on Eliquis . To help avoid this, consult your healthcare provider or pharmacist prior to using any new prescription or non-prescription medications, including herbals, vitamins, non-steroidal anti-inflammatory drugs (NSAIDs) and supplements.  This website has more information on Eliquis  (apixaban ): [email or web address] Jimmy Nelson

## 2024-01-25 NOTE — Progress Notes (Signed)
 PHARMACY - ANTICOAGULATION CONSULT NOTE  Pharmacy Consult for enoxaparin > apixaban Indication: pulmonary embolus  Allergies  Allergen Reactions   Bee Venom Anaphylaxis   Iodinated Contrast Media Anaphylaxis and Other (See Comments)    It is noted that pt is allergic to Iodinated contrast media-iv dye,oral contrast   Chantix [Varenicline Tartrate] Other (See Comments)    "Seizures"    Patient Measurements: Height: 5\' 11"  (180.3 cm) Weight: 65.7 kg (144 lb 14.4 oz) IBW/kg (Calculated) : 75.3 HEPARIN  DW (KG): 65.8  Vital Signs: Temp: 99.6 F (37.6 C) (04/30 0836) Temp Source: Axillary (04/30 0836) BP: 94/54 (04/30 0836) Pulse Rate: 96 (04/30 0836)  Labs: Recent Labs    01/23/24 1236 01/23/24 1426 01/23/24 2229 01/24/24 0020 01/24/24 0424 01/25/24 0539  HGB 17.1*  --   --   --  15.4 15.2  HCT 54.5*  --   --   --  47.2 48.1  PLT 208  --   --   --  202 192  CREATININE  --   --  1.52*  --  1.41* 1.38*  TROPONINIHS  --  15 16 16   --   --     Estimated Creatinine Clearance: 54.9 mL/min (A) (by C-G formula based on SCr of 1.38 mg/dL (H)).   Assessment: 89 YOM presents with chest pain. Perfusion lung scan with high probability of pulmonary embolus. Patient not on anticoagulation prior to hospitalization. Pharmacy consulted to transition from therapeutic enoxaparin to apixaban.   D-dimer 16.66 on admission. Hgb 15, platelets 192. No signs of bleeding reported.  Goal of Therapy:  Heparin  level 0.3-0.7 units/ml Monitor platelets by anticoagulation protocol: Yes   Plan:   Stop enoxaparin  Start apixaban 10 mg twice daily x7 days followed by apixaban 5 mg twice daily thereafter Continue to monitor H&H and platelets    Of note, copay $0    Thank you for allowing pharmacy to be a part of this patient's care.  Claudia Cuff, PharmD, BCPS Clinical Pharmacist

## 2024-01-25 NOTE — Progress Notes (Signed)
 MEWS Progress Note  Patient Details Name: Jimmy IACCARINO Sr. MRN: [id number] DOB: 1966-05-05 Today's Date: 01/25/2024   MEWS Flowsheet Documentation:  Assess: MEWS Score Temp: (!) 97.4 F (36.3 C) BP: 94/68 MAP (mmHg): 75 Pulse Rate: (!) 102 ECG Heart Rate: 79 Resp: 14 Level of Consciousness: Alert SpO2: 98 % O2 Device: Room Air O2 Flow Rate (L/min): 1 L/min Assess: MEWS Score MEWS Temp: 0 MEWS Systolic: 1 MEWS Pulse: 1 MEWS RR: 0 MEWS LOC: 0 MEWS Score: 2 MEWS Score Color: Yellow Assess: SIRS CRITERIA SIRS Temperature : 0 SIRS Respirations : 0 SIRS Pulse: 1 SIRS WBC: 0 SIRS Score Sum : 1 Assess: if the MEWS score is Yellow or Red Were vital signs accurate and taken at a resting state?: Yes Does the patient meet 2 or more of the SIRS criteria?: No MEWS guidelines implemented : Yes, yellow Treat MEWS Interventions: Considered administering scheduled or prn medications/treatments as ordered Take Vital Signs Increase Vital Sign Frequency : Yellow: Q2hr x1, continue Q4hrs until patient remains green for 12hrs Escalate MEWS: Escalate: Yellow: Discuss with charge nurse and consider notifying provider and/or RRT Notify: Charge Nurse/RN Name of Charge Nurse/RN Notified: Mindy, RN      [employer] C Landri Dorsainvil 01/25/2024, 12:59 AM

## 2024-01-25 NOTE — Telephone Encounter (Signed)
 Patient Advocate Encounter  Test billing for Jimmy Nelson and Jimmy Nelson both show copays of $0 for 90 days. Assistance not needed for these medications at this time.  Kennis Peacock, CPhT Rx Patient Advocate Phone: [phone]

## 2024-01-25 NOTE — Progress Notes (Signed)
   Heart Failure Stewardship Pharmacist Progress Note   PCP: Barbar Levine, MD PCP-Cardiologist: Nelia Balzarine, MD    HPI:  58 yo M with PMH of CAD, CHF, CVA, Crohn's disease, lung cancer, COPD, and prior substance use.    Presented to the ED on 4/28 with chest pain and diaphoresis. In the ED, he was tachycardic and BP was 94/76. EKG with sinus tachycardia. CXR with pulmonary scarring. Troponin 16, stable on repeat. VQ scan confirmed PE. He reports he had gallbladder surgery about 2 months ago and has been more sedentary since then. States his healing process was delayed with Crohn's disease. Code blue called on 4/28 but patient never lost pulses, more nonresponsive event due to symptomatic bradycardia. ECHO on 4/29 showed LVEF 20-25% (stable from 03/2021), RWMA, RV normal. PVCs noted on 4/30.  Patient denies shortness of breath. No LE edema. States he started feeling poorly in about February. Reports he was taking his medications prior to admission but would miss doses occasionally, sometimes twice weekly. States his fiance helps him set up his pill boxes. Agreeable to using [facility] TOC pharmacy at discharge. Would like to stay with [facility] for refills.   Current HF Medications: Beta Blocker: metoprolol  XL 12.5 mg daily  Prior to admission HF Medications: Beta blocker: metoprolol  XL 50 mg BID ACE/ARB/ARNI: Entresto  49/51 mg BID Other: ivabradine  5 mg BID  Pertinent Lab Values: Serum creatinine 1.38, BUN 10, Potassium 4.2, Sodium 136, Magnesium  2.2   Vital Signs: Weight: 144 lbs (admission weight: 145 lbs) Blood pressure: 90/60s  Heart rate: 80-90s  I/O: incomplete  Medication Assistance / Insurance Benefits Check: Does the patient have prescription insurance?  Yes Type of insurance plan: [place] [employer]  Outpatient Pharmacy:  Prior to admission outpatient pharmacy: [facility] Is the patient willing to use [facility] TOC pharmacy at discharge? Yes Is the patient willing to  transition their outpatient pharmacy to utilize a [facility] outpatient pharmacy?   No    Assessment: 1. Acute on chronic systolic CHF (LVEF 20-25%), due to ICM. NYHA class II symptoms. - Does not appear volume overloaded on exam - Agree with starting metoprolol  XL 12.5 mg daily with PVC burden. Monitor for further bradycardia.  - BP too soft to tolerate ACE/ARB/ARNI or MRA at this time. Patient is asymptomatic at this time.  - Consider starting SGLT2i prior to discharge   Plan: 1) Medication changes recommended at this time: - Start Farxiga 10 mg daily tomorrow  2) Patient assistance: - Eliquis copay $0 - Farxiga copay $0 - Jardiance copay $0 - Scale provided, has a pill box at home  3)  Education  - Patient has been educated on current HF medications and potential additions to HF medication regimen - Patient verbalizes understanding that over the next few months, these medication doses may change and more medications may be added to optimize HF regimen - Patient has been educated on basic disease state pathophysiology and goals of therapy   Jerilyn Monte, PharmD, BCPS Heart Failure Stewardship Pharmacist Phone [phone]
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
## 2024-01-25 NOTE — Progress Notes (Signed)
 Occupational Therapy Treatment Patient Details Name: Jimmy DINGELDEIN Sr. MRN: [id number] DOB: [birth year] Today's Date: 01/25/2024   History of present illness Pt is a 58 y/o M presenting to ED on 4/28 with midsternal chest pain, found to have segmental PE on R upper lung, and started on heparin  > lovenox 4/28. Code blue 4/28 for bradycardia, but then called off as pt able to wake up. PMH includes 2 prior MI's s/p ICD placement, CVA, CHF, HFrEF, lung cancer   OT comments  Patient seen for progression of ADLs and assessment of cognition. Patient set up for grooming ADLs, completing a number of laps in the hallway with cane with CGA. Patient reports he feels much better than day previous. OT inquiring about medication management. Patient with known cognitive deficits at baseline, patient is aware, and manages his medications with a pillbox, has alarms set on his phone to remind to take medications, and fiance also double checks that he takes them. Patient no longer drives. OT will see an additional session to practice tub transfer, and will sign off and defer to PT and mobility to work on functional deficits. OT changing recommendation to no OT follow up due to improvement at discharge.       If plan is discharge home, recommend the following:  A little help with walking and/or transfers;A lot of help with bathing/dressing/bathroom;Assistance with cooking/housework;Direct supervision/assist for medications management;Direct supervision/assist for financial management;Assist for transportation;Help with stairs or ramp for entrance   Equipment Recommendations  Other (comment) (RW)    Recommendations for Other Services      Precautions / Restrictions Precautions Precautions: Fall Restrictions Weight Bearing Restrictions Per Provider Order: No       Mobility Bed Mobility Overal bed mobility: Modified Independent                  Transfers Overall transfer level: Needs  assistance Equipment used: Straight cane Transfers: Sit to/from Stand Sit to Stand: Contact guard assist           General transfer comment: CGA with cane, able to ambulate with minimal sway throughout hallway     Balance Overall balance assessment: Needs assistance, History of Falls Sitting-balance support: Feet supported Sitting balance-Leahy Scale: Good     Standing balance support: Bilateral upper extremity supported, During functional activity, Reliant on assistive device for balance Standing balance-Leahy Scale: Poor Standing balance comment: Cane, minimal sway                           ADL either performed or assessed with clinical judgement   ADL Overall ADL's : Needs assistance/impaired     Grooming: Wash/dry hands;Wash/dry face;Oral care;Set up;Sitting                   Toilet Transfer: Contact guard assist Toilet Transfer Details (indicate cue type and reason): with cane, simulated         Functional mobility during ADLs: Contact guard assist General ADL Comments: Patient seen for progression of ADLs and assessment of cognition. Patient set up for grooming ADLs, completing a number of laps in the hallway with cane with CGA. Patient reports he feels much better than day previous. OT inquiring about medication management. Patient with known cognitive deficits at baseline, patient is aware, and manages his medications with a pillbox, has alarms set on his phone to remind to take medications, and fiance also double checks that he takes them. Patient no longer  drives. OT will see an additional session to practice tub transfer, and will sign off and defer to PT and mobility to work on functional deficits. OT changing recommendation to no OT follow up due to improvement at discharge.    Extremity/Trunk Assessment Upper Extremity Assessment RUE Deficits / Details: global weakness 4/5, hx CVA RUE Coordination: decreased fine motor;decreased gross motor             Vision       Perception     Praxis     Communication Communication Communication: No apparent difficulties   Cognition Arousal: Alert Behavior During Therapy: WFL for tasks assessed/performed Cognition: History of cognitive impairments             OT - Cognition Comments: Patient with known cognitive deficits at baseline, patient is aware, and manages his medications with a pillbox, has alarms set on his phone to remind to take medications, and fiance also double checks                 Following commands: Intact        Cueing   Cueing Techniques: Verbal cues  Exercises      Shoulder Instructions       General Comments      Pertinent Vitals/ Pain       Pain Assessment Pain Assessment: No/denies pain  Home Living                                          Prior Functioning/Environment              Frequency  Min 2X/week        Progress Toward Goals  OT Goals(current goals can now be found in the care plan section)  Progress towards OT goals: Progressing toward goals  Acute Rehab OT Goals Patient Stated Goal: to get better OT Goal Formulation: With patient Time For Goal Achievement: 02/07/24 Potential to Achieve Goals: Good  Plan      Co-evaluation                 AM-PAC OT "6 Clicks" Daily Activity     Outcome Measure   Help from another person eating meals?: None Help from another person taking care of personal grooming?: A Little Help from another person toileting, which includes using toliet, bedpan, or urinal?: A Little Help from another person bathing (including washing, rinsing, drying)?: A Little Help from another person to put on and taking off regular upper body clothing?: A Little Help from another person to put on and taking off regular lower body clothing?: A Little 6 Click Score: 19    End of Session Equipment Utilized During Treatment: Gait belt (Cane)  OT Visit Diagnosis:  Unsteadiness on feet (R26.81);Other abnormalities of gait and mobility (R26.89);Muscle weakness (generalized) (M62.81);History of falling (Z91.81)   Activity Tolerance Patient tolerated treatment well   Patient Left in chair;with call bell/phone within reach;with chair alarm set   Nurse Communication Mobility status        Time: 1610-9604 OT Time Calculation (min): 30 min  Charges: OT General Charges $OT Visit: 1 Visit OT Treatments $Self Care/Home Management : 23-37 mins  Mollie Anger E. Shamir Tuzzolino, OTR/L Acute Rehabilitation Services [phone]   Jimmy Nelson 01/25/2024, 12:19 PM

## 2024-01-25 NOTE — Plan of Care (Signed)

## 2024-01-25 NOTE — Progress Notes (Signed)
 HD#1 SUBJECTIVE:  Patient Summary: Jimmy Nelson. is a 58 y.o. 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   Overnight Events: Had chest pain that did not improve with a lidocaine  patch so he was given Dilaudid .  The chest pain was constant.  Interim History: States that he did eat a big meal yesterday, and that the chest pain was more in the epigastric region and it was burning retrosternally.  The chest pain is now better.  His appetite is back.  He denies any shortness of breath, nausea, or abdominal pain.  OBJECTIVE:  Vital Signs: Vitals:   01/25/24 0058 01/25/24 0422 01/25/24 0500 01/25/24 0836  BP: 94/68 90/64  (!) 94/54  Pulse: (!) 102 93  96  Resp: 18 17  17   Temp: (!) 97.4 F (36.3 C) 97.8 F (36.6 C)  99.6 F (37.6 C)  TempSrc: Oral Oral  Axillary  SpO2: 98% 98%  97%  Weight:   65.7 kg   Height:       Supplemental O2: Nasal Cannula SpO2: 97 % O2 Flow Rate (L/min): 1 L/min  Filed Weights   01/23/24 1214 01/24/24 0500 01/25/24 0500  Weight: 65.8 kg 66.2 kg 65.7 kg     Intake/Output Summary (Last 24 hours) at 01/25/2024 1032 Last data filed at 01/25/2024 0555 Gross per 24 hour  Intake 240 ml  Output --  Net 240 ml   Net IO Since Admission: 240 mL [01/25/24 1032]  Physical Exam: Physical Exam Constitutional:      General: He is not in acute distress.    Appearance: He is ill-appearing (chronically).  Cardiovascular:     Heart sounds: Heart sounds are distant.  Pulmonary:     Effort: Pulmonary effort is normal. No tachypnea or respiratory distress.     Breath sounds: Examination of the right-upper field reveals decreased breath sounds. Examination of the left-upper field reveals decreased breath sounds. Examination of the right-middle field reveals decreased  breath sounds. Examination of the left-middle field reveals decreased breath sounds. Examination of the right-lower field reveals decreased breath sounds. Examination of the left-lower field reveals decreased breath sounds. Decreased breath sounds present. No wheezing, rhonchi or rales.  Chest:     Chest wall: No tenderness.  Abdominal:     General: Bowel sounds are normal. There is no abdominal bruit.     Tenderness: There is no abdominal tenderness.  Musculoskeletal:     Right lower leg: No edema.     Left lower leg: No edema.  Skin:    Capillary Refill: Capillary refill takes less than 2 seconds.     Coloration: Skin is not cyanotic.  Neurological:     Mental Status: He is alert and oriented to person, place, and time.    Patient Lines/Drains/Airways Status     Active Line/Drains/Airways     Name Placement date Placement time Site Days   Peripheral IV 01/23/24 20 G 1.88" Right;Lateral Forearm 01/23/24  1330  Forearm  1   Sheath 05/05/19 Right Venous 05/05/19  0305  Venous  1725   Sheath 05/05/19 Right Arterial 05/05/19  0307  Arterial  1725   Wound 12/30/12 Skin tear Arm Right;Left multiple skin tears on BUE 12/30/12  1525  Arm  4042  ASSESSMENT/PLAN:  Assessment: Principal Problem:   Pulmonary emboli (HCC) Active Problems:   Hypotension due to hypovolemia   Chest pain   Plan:  Jimmy Nelson Sr. is a 58 y.o. 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   Pulmonary emboli  on the posterior segment of the right upper lung, anterior segment of the right upper lung, left upper lung Chest Pain Hemoptysis is improved.  He has been weaned to room air.  Has been having some chest pain but this may be related to GERD.  He describes a  burning sensation and pain on the epigastric region and did have a large meal last night.  Notably his pain may be also be caused by his pulmonary emboli and PVC burden.  He had PVCs around 2 AM when he expressed chest pain last night.  Will add on his metoprolol  again for PVCs at a lower dose given that he was hypotensive.  He has improved and okay to transition to a DOAC. Chest pain has been significant, we will monitor overnight.  -Transition to apixaban 10 mg twice daily for 7 days followed by apixaban 5 mg twice daily after - Continue with telemetry - Pepcid  20mg  at bedtime - cw protonix  40mg  once daily  - Metoprolol  12.5mg  once daily   Hepatitis A Transaminases continue to improve.  Nausea is also improving. -Continue with Zofran  as needed  Hx of alcohol abuse  States that he had a bottle of fifth on Sunday before that he had not drank in about 20 years. He may benefit from starting naltrexone in the future.   -cw Thiamine low dose daily  -Continue with folic acid supplementation -daily MV    HFrEF Ejection fraction now has decreased to 20 to 25% with severe left diastolic dysfunction.  May also have an aneurysm.  This could not be ruled out.  Currently taking Lipitor  80 mg, Toprol  50 mg twice daily, nitroglycerin  as needed, Entresto  49-51 mg twice daily.  His creatinine seems to be stable at baseline.  He is currently euvolemic but we will hold off blood pressure medications given that he was hypotensive overnight.  Will also hold off the metoprolol  since he was bradycardic overnight.  -hold metoprolol  50 mg twice daily -hold Lipitor  80 mg daily -hold Entresto  49-50 mg twice daily -Echo  -I/Os -Standing weights only   PVCs May be causing some chest pain since he has had a significant burden of them over the course of his hospitalization.  He usually takes metoprolol  XL 50 milligrams twice daily.  Blood pressure overnight was still soft 94/54 but heart rate now is at 96.  Will add  on metoprolol  XL 12.5 mg once daily today.    COPD Seems like he is using 2.5 L at baseline.  Has been weaned to room air with an oxygenation of 97%.  Goal oxygenation requirements for person with COPD is 88 to 92%.  He is on Breztri, seems that at some point he was on montelukast 10 mg daily but he has not dispensed since his November 2024.  He will benefit from quitting smoking. -Continue with Breztri  -albuterol  prn -Duoneb q6PRN   HLD  CAD  With ICD placed.  On ezetimibe  10 mg daily and atorvastatin   80 mg daily.  Will continue to hold this given LFT elevation.   History of seizures Depakote level is less than 10.  States that he has been taking his Depakote.  His last stroke was about 6 months ago.  He denies it being secondary to his alcohol use.  Dates that these started after he was given varenicline for smoking cessation.  His PCP prescribes his Depakote.  Given that his LFTs are improving, we will continue on the valproate 250 mg in the morning and 500 mg at bedtime.  He will need outpatient follow-up.  Back pain States that he has recently been diagnosed with scoliosis.  He usually takes gabapentin 600 mg 3 times daily.  We will continue with the same.  Hypertension On ivabradine  5 mg BID.  Will hold this for now.   Mood disorder On 150 mg of sertraline daily.  Will continue with the same.   Diet: Heart Healthy VTE: Apixaban IVF: None, Code: Full DISPO: Anticipated discharge in 1-2 days to Home pending  medical workup .  Signature: [facility]  Internal Medicine Resident, PGY-1 Jimmy Nelson Internal Medicine Res[facility]  10:32 AM, 01/25/2024   Please contact the on call pager [phone] for urgent or emergent concerns.

## 2024-01-26 ENCOUNTER — Other Ambulatory Visit (HOSPITAL_COMMUNITY): Payer: Self-pay

## 2024-01-26 DIAGNOSIS — I110 Hypertensive heart disease with heart failure: Secondary | ICD-10-CM | POA: Diagnosis not present

## 2024-01-26 DIAGNOSIS — I2694 Multiple subsegmental pulmonary emboli without acute cor pulmonale: Secondary | ICD-10-CM | POA: Diagnosis not present

## 2024-01-26 DIAGNOSIS — R079 Chest pain, unspecified: Secondary | ICD-10-CM | POA: Diagnosis not present

## 2024-01-26 DIAGNOSIS — J449 Chronic obstructive pulmonary disease, unspecified: Secondary | ICD-10-CM | POA: Diagnosis not present

## 2024-01-26 LAB — CBC
HCT: 43.3 % (ref 39.0–52.0)
Hemoglobin: 13.8 g/dL (ref 13.0–17.0)
MCH: 28.3 pg (ref 26.0–34.0)
MCHC: 31.9 g/dL (ref 30.0–36.0)
MCV: 88.7 fL (ref 80.0–100.0)
Platelets: 172 10*3/uL (ref 150–400)
RBC: 4.88 MIL/uL (ref 4.22–5.81)
RDW: 14.2 % (ref 11.5–15.5)
WBC: 7.7 10*3/uL (ref 4.0–10.5)
nRBC: 0 % (ref 0.0–0.2)

## 2024-01-26 LAB — COMPREHENSIVE METABOLIC PANEL WITH GFR
ALT: 49 U/L — ABNORMAL HIGH (ref 0–44)
AST: 26 U/L (ref 15–41)
Albumin: 3 g/dL — ABNORMAL LOW (ref 3.5–5.0)
Alkaline Phosphatase: 116 U/L (ref 38–126)
Anion gap: 8 (ref 5–15)
BUN: 7 mg/dL (ref 6–20)
CO2: 27 mmol/L (ref 22–32)
Calcium: 8.7 mg/dL — ABNORMAL LOW (ref 8.9–10.3)
Chloride: 104 mmol/L (ref 98–111)
Creatinine, Ser: 1.38 mg/dL — ABNORMAL HIGH (ref 0.61–1.24)
GFR, Estimated: 60 mL/min — ABNORMAL LOW (ref >60)
Glucose, Bld: 78 mg/dL (ref 70–99)
Potassium: 3.7 mmol/L (ref 3.5–5.1)
Sodium: 139 mmol/L (ref 135–145)
Total Bilirubin: 0.6 mg/dL (ref 0.0–1.2)
Total Protein: 6.1 g/dL — ABNORMAL LOW (ref 6.5–8.1)

## 2024-01-26 LAB — TROPONIN I (HIGH SENSITIVITY): Troponin I (High Sensitivity): 8 ng/L (ref <18)

## 2024-01-26 MED ORDER — POLYETHYLENE GLYCOL 3350 17 G PO PACK: 17.0000 g | PACK | Freq: Every day | ORAL | Status: DC | PRN

## 2024-01-26 MED ORDER — PANTOPRAZOLE SODIUM 40 MG PO TBEC: 40.0000 mg | DELAYED_RELEASE_TABLET | Freq: Two times a day (BID) | ORAL | Status: DC

## 2024-01-26 MED ORDER — METOPROLOL SUCCINATE ER 25 MG PO TB24
12.5000 mg | ORAL_TABLET | Freq: Two times a day (BID) | ORAL | 0 refills | Status: AC
  Filled 2024-01-26: qty 30, 30d supply, fill #0

## 2024-01-26 MED ORDER — FOLIC ACID 1 MG PO TABS
1.0000 mg | ORAL_TABLET | Freq: Every day | ORAL | 0 refills | Status: AC
  Filled 2024-01-26: qty 30, 30d supply, fill #0

## 2024-01-26 MED ORDER — FAMOTIDINE 20 MG PO TABS
20.0000 mg | ORAL_TABLET | Freq: Every day | ORAL | 0 refills | Status: AC
  Filled 2024-01-26: qty 30, 30d supply, fill #0

## 2024-01-26 MED ORDER — EZETIMIBE 10 MG PO TABS
10.0000 mg | ORAL_TABLET | Freq: Every day | ORAL | 0 refills | Status: DC
  Filled 2024-01-26: qty 30, 30d supply, fill #0

## 2024-01-26 MED ORDER — APIXABAN (ELIQUIS) VTE STARTER PACK (10MG AND 5MG)
ORAL_TABLET | ORAL | 0 refills | Status: AC
Start: 2024-01-26 — End: 2024-03-03
  Filled 2024-01-26: qty 74, 30d supply, fill #0

## 2024-01-26 MED ORDER — ADULT MULTIVITAMIN W/MINERALS CH
1.0000 | ORAL_TABLET | Freq: Every day | ORAL | 0 refills | Status: AC
  Filled 2024-01-26 (×2): qty 30, 30d supply, fill #0

## 2024-01-26 MED ORDER — APIXABAN (ELIQUIS) EDUCATION KIT FOR DVT/PE PATIENTS
PACK | Freq: Once | Status: AC
  Filled 2024-01-26: qty 1

## 2024-01-26 MED ORDER — METHOCARBAMOL 1000 MG/10ML IJ SOLN
500.0000 mg | Freq: Once | INTRAMUSCULAR | Status: AC
  Administered 2024-01-26: 500 mg via INTRAVENOUS
  Filled 2024-01-26: qty 10

## 2024-01-26 MED ORDER — METOPROLOL SUCCINATE ER 25 MG PO TB24
12.5000 mg | ORAL_TABLET | Freq: Once | ORAL | Status: AC
  Administered 2024-01-26: 12.5 mg via ORAL
  Filled 2024-01-26: qty 1

## 2024-01-26 MED ORDER — ACETAMINOPHEN 500 MG PO TABS
500.0000 mg | ORAL_TABLET | Freq: Four times a day (QID) | ORAL | 0 refills | Status: AC | PRN
  Filled 2024-01-26 (×2): qty 60, 15d supply, fill #0

## 2024-01-26 MED ORDER — BREZTRI AEROSPHERE 160-9-4.8 MCG/ACT IN AERO
2.0000 | INHALATION_SPRAY | Freq: Two times a day (BID) | RESPIRATORY_TRACT | 0 refills | Status: AC
  Filled 2024-01-26: qty 10.7, 30d supply, fill #0

## 2024-01-26 MED ORDER — DIVALPROEX SODIUM 250 MG PO DR TAB
DELAYED_RELEASE_TABLET | ORAL | 0 refills | Status: AC
Start: 2024-01-26 — End: ?
  Filled 2024-01-26: qty 90, 30d supply, fill #0

## 2024-01-26 MED ORDER — ONDANSETRON 4 MG PO TBDP
4.0000 mg | ORAL_TABLET | Freq: Three times a day (TID) | ORAL | 0 refills | Status: AC | PRN
  Filled 2024-01-26: qty 10, 4d supply, fill #0

## 2024-01-26 MED ORDER — PANTOPRAZOLE SODIUM 40 MG PO TBEC
40.0000 mg | DELAYED_RELEASE_TABLET | Freq: Two times a day (BID) | ORAL | 0 refills | Status: AC
  Filled 2024-01-26: qty 60, 30d supply, fill #0

## 2024-01-26 MED ORDER — THIAMINE HCL 100 MG PO TABS
100.0000 mg | ORAL_TABLET | Freq: Every day | ORAL | 0 refills | Status: AC
  Filled 2024-01-26 (×2): qty 30, 30d supply, fill #0

## 2024-01-26 MED ORDER — SERTRALINE HCL 50 MG PO TABS
150.0000 mg | ORAL_TABLET | Freq: Every day | ORAL | 0 refills | Status: AC
  Filled 2024-01-26: qty 60, 20d supply, fill #0

## 2024-01-26 NOTE — Progress Notes (Addendum)
   Heart Failure Stewardship Pharmacist Progress Note   PCP: Barbar Levine, MD PCP-Cardiologist: Nelia Balzarine, MD    HPI:  58 yo M with PMH of CAD, CHF, CVA, Crohn's disease, lung cancer, COPD, and prior substance use.    Presented to the ED on 4/28 with chest pain and diaphoresis. In the ED, he was tachycardic and BP was 94/76. EKG with sinus tachycardia. CXR with pulmonary scarring. Troponin 16, stable on repeat. VQ scan confirmed PE. He reports he had gallbladder surgery about 2 months ago and has been more sedentary since then. States his healing process was delayed with Crohn's disease. Code blue called on 4/28 but patient never lost pulses, more nonresponsive event due to symptomatic bradycardia. ECHO on 4/29 showed LVEF 20-25% (stable from 03/2021), RWMA, RV normal. PVCs noted on 4/30.  Patient denies shortness of breath. No LE edema. Denies lightheadedness or dizziness. States he started feeling poorly in about February. Reports he was taking his medications prior to admission but would miss doses occasionally, sometimes twice weekly. States his fiance helps him set up his pill boxes. Agreeable to using [facility] pharmacy at discharge. Would like to stay with [facility] for refills. Hopeful for discharge today since last night was uneventful for him.   Current HF Medications: Beta Blocker: metoprolol  XL 12.5 mg daily  Prior to admission HF Medications: Beta blocker: metoprolol  XL 50 mg BID ACE/ARB/ARNI: Entresto  49/51 mg BID Other: ivabradine  5 mg BID  Pertinent Lab Values: Serum creatinine 1.38, BUN 7, Potassium 3.7, Sodium 139, Magnesium  2.2   Vital Signs: Weight: 147 lbs (admission weight: 145 lbs) Blood pressure: 110/70s  Heart rate: 80-90s  I/O: incomplete  Medication Assistance / Insurance Benefits Check: Does the patient have prescription insurance?  Yes Type of insurance plan: [place] [employer]  Outpatient Pharmacy:  Prior to admission outpatient pharmacy:  [facility] Is the patient willing to use [facility] TOC pharmacy at discharge? Yes Is the patient willing to transition their outpatient pharmacy to utilize a [facility] outpatient pharmacy?   No    Assessment: 1. Acute on chronic systolic CHF (LVEF 20-25%), due to ICM. NYHA class II symptoms. - Does not appear volume overloaded on exam - Continue metoprolol  XL 12.5 mg daily  - BP improved today, may be able to tolerate spironolactone 12.5 mg daily  - Consider starting SGLT2i prior to discharge   Plan: 1) Medication changes recommended at this time: - Start Farxiga 10 mg daily  - Start spironolactone 12.5 mg daily  2) Patient assistance: - Eliquis  copay $0 - Farxiga copay $0 - Jardiance copay $0 - Scale provided, has a pill box at home  3)  Education  - Patient has been educated on current HF medications and potential additions to HF medication regimen - Patient verbalizes understanding that over the next few months, these medication doses may change and more medications may be added to optimize HF regimen - Patient has been educated on basic disease state pathophysiology and goals of therapy   Jerilyn Monte, PharmD, BCPS Heart Failure Stewardship Pharmacist Phone [phone]

## 2024-01-26 NOTE — Discharge Summary (Signed)
 Name: Jimmy VOLZ Sr. MRN: [id number] DOB: [birth year] 58 y.o. PCP: Barbar Levine, MD  Date of Admission: 01/23/2024 12:03 PM Date of Discharge:  01/26/2024 Attending Physician: Dr.  Lelia Putnam  DISCHARGE DIAGNOSIS:  Primary Problem: Pulmonary emboli [facility])   Hospital Problems: Principal Problem:   Pulmonary emboli (HCC) Active Problems:   Hypotension due to hypovolemia   Chest pain    DISCHARGE MEDICATIONS:   Allergies as of 01/26/2024       Reactions   Bee Venom Anaphylaxis   Iodinated Contrast Media Anaphylaxis, Other (See Comments)   It is noted that pt is allergic to Iodinated contrast media-iv dye,oral contrast   Chantix [varenicline Tartrate] Other (See Comments)   "Seizures"        Medication List     PAUSE taking these medications    aspirin  EC 81 MG tablet Wait to take this until: Feb 01, 2024 Take 162 mg by mouth in the morning. Swallow whole.   atorvastatin  80 MG tablet Wait to take this until your doctor or other care provider tells you to start again. Commonly known as: LIPITOR  Take 1 tablet (80 mg total) by mouth daily.   Entresto  49-51 MG Wait to take this until your doctor or other care provider tells you to start again. Generic drug: sacubitril-valsartan TAKE ONE TABLET BY MOUTH TWICE DAILY       STOP taking these medications    esomeprazole  40 MG capsule Commonly known as: NEXIUM  Replaced by: pantoprazole  40 MG tablet   ferrous sulfate  325 (65 FE) MG tablet   ibuprofen 200 MG tablet Commonly known as: ADVIL   ivabradine  5 MG Tabs tablet Commonly known as: Corlanor    methocarbamol  750 MG tablet Commonly known as: ROBAXIN    mirtazapine  7.5 MG tablet Commonly known as: REMERON    Multi For Him 50+ Tabs   thiamine  50 MG tablet Commonly known as: VITAMIN B-1       TAKE these medications    acetaminophen  500 MG tablet Commonly known as: TYLENOL  Take 1 tablet (500 mg total) by mouth every 6 (six) hours as needed for  moderate pain (pain score 4-6) or mild pain (pain score 1-3). What changed:  medication strength how much to take reasons to take this   apixaban  5 MG Tabs tablet Commonly known as: ELIQUIS  Take 2 tablets (10 mg total) by mouth 2 (two) times daily for 7 days, THEN 1 tablet (5 mg total) 2 (two) times daily. Start taking on: Jan 26, 2024   Breztri  Aerosphere 160-9-4.8 MCG/ACT Aero inhaler Generic drug: budeson-glycopyrrolate -formoterol  Inhale 2 puffs into the lungs in the morning and at bedtime.   CoQ10 100 MG Caps Take 100 mg by mouth in the morning.   divalproex  250 MG DR tablet Commonly known as: DEPAKOTE  Take 1 tablet (250 mg total) by mouth in the morning AND 2 tablets (500 mg total) at bedtime. What changed: See the new instructions.   docusate sodium 100 MG capsule Commonly known as: COLACE Take 200 mg by mouth in the morning.   EPINEPHrine 0.3 mg/0.3 mL Soaj injection Commonly known as: EPI-PEN Inject 0.3 mg into the muscle as needed for anaphylaxis.   ezetimibe  10 MG tablet Commonly known as: ZETIA  Take 1 tablet (10 mg total) by mouth daily.   famotidine  20 MG tablet Commonly known as: PEPCID  Take 1 tablet (20 mg total) by mouth at bedtime.   folic acid  1 MG tablet Commonly known as: FOLVITE  Take 1 tablet (1 mg total) by  mouth daily. Start taking on: Jan 27, 2024   gabapentin  600 MG tablet Commonly known as: NEURONTIN  Take 600 mg by mouth in the morning, at noon, and at bedtime.   ipratropium-albuterol  0.5-2.5 (3) MG/3ML Soln Commonly known as: DUONEB Inhale 3 mLs into the lungs See admin instructions. Nebulize 3 ml's and inhale into the lungs every 4-6 hours or as needed for shortness of breath and/or wheezing   magnesium  oxide 400 MG tablet Commonly known as: MAG-OX Take 400 mg by mouth in the morning, at noon, and at bedtime.   metoprolol  succinate 25 MG 24 hr tablet Commonly known as: TOPROL -XL Take 0.5 tablets (12.5 mg total) by mouth 2 (two) times  daily. What changed:  medication strength how much to take additional instructions   multivitamin with minerals Tabs tablet Take 1 tablet by mouth daily. Start taking on: Jan 27, 2024   nicotine  21 mg/24hr patch Commonly known as: NICODERM CQ  - dosed in mg/24 hours Place 21 mg onto the skin daily.   nitroGLYCERIN  0.4 MG SL tablet Commonly known as: NITROSTAT  DISSOLVE 1 TABLET UNDER THE TONGUE EVERY 5 MINUTES AS NEEDED FOR CHEST PAIN. (MAXIMUM OF 3 DOSES)   ondansetron  4 MG disintegrating tablet Commonly known as: ZOFRAN -ODT Take 1 tablet (4 mg total) by mouth every 8 (eight) hours as needed for nausea or vomiting.   pantoprazole  40 MG tablet Commonly known as: PROTONIX  Take 1 tablet (40 mg total) by mouth 2 (two) times daily. Do not take at the same time as famotidine  Replaces: esomeprazole  40 MG capsule   ProAir  RespiClick 108 (90 Base) MCG/ACT Aepb Generic drug: Albuterol  Sulfate Inhale 2 puffs into the lungs every 6 (six) hours as needed (shortness of breath and wheezing).   pyridOXINE 100 MG tablet Commonly known as: VITAMIN B6 Take 100 mg by mouth in the morning.   sertraline  50 MG tablet Commonly known as: ZOLOFT  Take 3 tablets (150 mg total) by mouth daily. Start taking on: Jan 27, 2024 What changed:  medication strength when to take this   thiamine  100 MG tablet Commonly known as: Vitamin B-1 Take 1 tablet (100 mg total) by mouth daily. Start taking on: Jan 27, 2024   vitamin B-12 500 MCG tablet Commonly known as: CYANOCOBALAMIN Take 500 mcg by mouth in the morning.   Zinc 50 MG Tabs Take 50 mg by mouth in the morning.               Durable Medical Equipment  (From admission, onward)           Start     Ordered   01/25/24 1152  For home use only DME Walker rolling  Once       Question Answer Comment  Walker: With 5 Inch Wheels   Patient needs a walker to treat with the following condition Weakness      01/25/24 1151             DISPOSITION AND FOLLOW-UP:  [name] was discharged from [facility] in Stable condition. At the hospital follow up visit please address:  Follow-up Recommendations: Consults:Cardiology, Neurology if seizures continue Labs: CBC, Cholesterol, and Comprehensive Metabolic Panel Medications: please refer to hospital course (metoprolol , GDMT, Depakote , Multi-vitamin, statin, ezetimibe , famotidine , protonix )   Follow-up Appointments:  Follow-up Information     [facility]. Go in 4 day(s).   Specialty: Cardiology Why: Hospital follow up 01/31/2024 @ 2:45 pm PLEASE bring  a current medication list to appointment FREE valet parking, Entrance C, (by [employer] [facility]) off [address] [employer] information: [address] [place]  [phone]        [facility] Follow up.   Why: Call to arrange transportation through your medicaid. They need 2-3 days notice. Contact information: [phone]        [facility] Follow up.   Specialty: Home Health Services Why: [facility] will contact you for the first home visit. Contact information: [address] [place] [address] [phone]         Barbar Levine, MD. Call today.   Specialty: Family Medicine Why: For a hospital follow-up Contact information: [address] [place] [address] [phone]                 HOSPITAL COURSE:  Patient Summary: Pulmonary emboli  on the posterior segment of the right upper lung, anterior segment of the right upper lung, left upper lung Chest Pain Esa Sanderlin. 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  He describes this pain being different  than his normal heart attacks.  Was given aspirin  and found no relief.  He is allergic to contrast, and with his poor creatinine function, VQ scan was done and it was discovered that he had pulmonary emboli on the right upper lung.  He was starting on Lovenox  and then transitioned to apixaban .  He did have some hemoptysis during his hospitalization which then resolved.  He is to remain on apixaban  10 mg twice a day for 7 days (until 02/01/2024) followed by apixaban  5 mg twice daily indefinitely given that these are less likely provoked.  He does have a history of heart failure.  An echo revealed decreased ejection fraction of 20 to 25% with severe left diastolic dysfunction.  There may also be an aneurysm on the left ventricle.  The presence of a thrombus could not be ruled out.  He does have a significant history of prior MIs x 2 and has been having chest pain during his hospitalization.  This could be due to his Pes but he also has a significant cardiac history. Troponins were not elevated (highest 16) and EKGs has never shown any ST changes.  He does have a history of GERD, and describes the chest pain as burning and more localized near the epigastrium. He was using Protonix  40 mg once daily.  We added Pepcid  20 mg at bedtime and protonix  was increased to BID to help with this chest pain.  He is to continue on Tylenol  up to 500 mg every 8 hours as needed for pain with a max of 2g of acetaminophen  daily given his current hepatitis. -Please follow-up on chest pain.  There were no signs of right heart strain on echo. -Tylenol  500 mg every 8 hours as needed for pain for maximum of 2 g daily giving LFT elevations.  Once LFTs normalize he may be able to go up to 4,000 mg a day. -Continue with apixaban  10mg  twice daily, transition to 5mg  twice daily on 02/02/2024 -Hold aspirin  until 02/02/2024 when he transitions to apixaban  5mg  twice daily -Protonix  40 mg twice daily -Famotidine  20 mg at bedtime -Lifestyle  modification for GERD  Hepatitis A  Incidentally Mr. Argumedo came in with nausea and elevated transaminases.  Hepatitis panel was obtained and it was found that he had hepatitis A.  He is needed Zofran  intermittently for nausea.  He will use Zofran  only when absolutely needed for his nausea.  His nausea has been improving during the course of his hospitalization has been needing it less and less. 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 However on Sunday, he drank a bottle of fifth.  He was given resources to help him to remain abstinent of alcohol.  He may benefit from starting naltrexone if he continues to crave alcohol and is relapsing. Please follow up OP. -Consider counseling -Consider naltrexone in the future if he still craving alcohol.  Heart failure with reduced ejection fraction An echo was done on 01/24/2024 that showed an ejection fraction of 20 to 25% with severe left diastolic dysfunction, a left ventricular aneurysm, a thrombus could not be ruled out.  He is currently on Lipitor  80 mg, and was taking metoprolol  50 mg twice daily at home.  Other home medications included nitroglycerin  as needed Entresto  49-51 milligrams twice daily.  During the course of his hospitalization he was bradycardic around the 30s and his blood pressures were soft.  Because of this his metoprolol  was held and then restarted a lower dose of 12.5 mg once daily. This was uptitrated to 12.5mg  BID at day of discharge.  Once blood pressure continues to increase he may benefit from increasing his dose. Consider starting Farxiga 10 mg and an SGLT2 inhibitor.  Depending how his blood pressures do he may also be able to tolerate ACE, ARB, ARNI or MRA in the future.   - Follow-up on blood pressures and adjust GDMT as tolerated -Patient has a  follow-up with cardiology on 01/31/2024  COPD Tobacco abuse Was using 2.5 L at baseline.  Has been weaned to room air with an oxygenation up to 100%.  Goal oxygenation requirements for person with COPD is 88 to 92%.  He does not need home oxygen .  He is on Breztri , seems that at some point he was on montelukast 10 mg daily but he has not dispensed since his November 2024.  He will benefit from quitting smoking. -Continue with Breztri   -albuterol  prn -Duoneb q6PRN -Continue with smoking cessation counseling  -Cw Nicotine  patches  -Got seizures with varenicline  HLD  CAD  With ICD placed.  On ezetimibe  10 mg daily and atorvastatin  80 mg daily.  Will continue to hold atorvastatin  given LFT elevation. -Can restart ezetimibe  10mg  daily -restart atorvastatin  80mg  daily once LFTs resolve   History of seizures Depakote  level was less than 10.  States that he has been taking his Depakote .  His last stroke was about 6 months ago.  He denies it being secondary to his alcohol use.  States that these started after he was given varenicline for smoking cessation.  His PCP prescribes his Depakote .  Given that his LFTs are improving, we will continue on the valproate 250 mg in the morning and 500 mg at bedtime.  He will need outpatient follow-up to determine if he still needs this given that he is off varenicline. Could be that he has a lower seizure threshold with hx of CVA and alcohol use.   -Will need follow-up with PCP and  may need referral to neurology if he continues having seizures despite discontinuing varenicline.   Back pain States that he has recently been diagnosed with scoliosis.  He usually takes gabapentin  600 mg 3 times daily.    CKD Stage II  Baseline around 1.3-1.5. At baseline on discharge.   Hypertension On ivabradine  5 mg BID.  BP during the course of his hospitalization have been soft/low with SBPs in the 90s. Due to heavy PVC burden he was restarted on metoprolol .  -Cw Metoprolol   12.5mg  BID with close PCP follow-up  Mood disorder On 150 mg of sertraline  daily.  Will continue with the same.    DISCHARGE INSTRUCTIONS:   Discharge Instructions     (HEART FAILURE PATIENTS) Call MD:  Anytime you have any of the following symptoms: 1) 3 pound weight gain in 24 hours or 5 pounds in 1 week 2) shortness of breath, with or without a dry hacking cough 3) swelling in the hands, feet or stomach 4) if you have to sleep on extra pillows at night in order to breathe.   Complete by: As directed    Call MD for:  difficulty breathing, headache or visual disturbances   Complete by: As directed    Call MD for:  extreme fatigue   Complete by: As directed    Call MD for:  hives   Complete by: As directed    Call MD for:  persistant dizziness or light-headedness   Complete by: As directed    Call MD for:  persistant nausea and vomiting   Complete by: As directed    Call MD for:  severe uncontrolled pain   Complete by: As directed    Call MD for:  temperature >100.4   Complete by: As directed    Diet - low sodium heart healthy   Complete by: As directed    Increase activity slowly   Complete by: As directed        SUBJECTIVE:  Is feeling "100% better" than what he felt on Monday. No SOB, chest pain resolved. Has been eating a lot and drinking a lot of mountain dew.  Discharge Vitals:   BP 105/76 (BP Location: Right Arm)   Pulse 88   Temp 98 F (36.7 C) (Oral)   Resp 18   Ht 5\' 11"  (1.803 m)   Wt 66.7 kg   SpO2 95%   BMI 20.52 kg/m   OBJECTIVE:  Physical Exam Constitutional:      Appearance: He is ill-appearing.  Eyes:     Pupils: Pupils are equal, round, and reactive to light.  Cardiovascular:     Rate and Rhythm: Normal rate and regular rhythm.     Heart sounds: Heart sounds not distant.  Pulmonary:     Effort: No tachypnea, accessory muscle usage or respiratory distress.     Breath sounds: Examination of the right-upper field reveals decreased breath  sounds. Examination of the left-upper field reveals decreased breath sounds. Examination of the right-middle field reveals decreased breath sounds. Examination of the left-middle field reveals decreased breath sounds. Examination of the right-lower field reveals decreased breath sounds. Examination of the left-lower field reveals decreased breath sounds. Decreased breath sounds present. No wheezing, rhonchi or rales.  Chest:     Chest wall: No tenderness.  Abdominal:     General: There is no abdominal bruit.     Palpations: Abdomen is soft. There is no hepatomegaly or splenomegaly.     Tenderness: There is no abdominal tenderness.  There is no guarding or rebound.  Musculoskeletal:     Right lower leg: No edema.     Left lower leg: No edema.  Skin:    General: Skin is warm and dry.     Capillary Refill: Capillary refill takes less than 2 seconds.     Coloration: Skin is pale.  Neurological:     Mental Status: He is alert and oriented to person, place, and time.      Pertinent Labs, Studies, and Procedures:     Latest Ref Rng & Units 01/26/2024    6:40 AM 01/25/2024    5:39 AM 01/24/2024    4:24 AM  CBC  WBC 4.0 - 10.5 K/uL 7.7  7.0  7.6   Hemoglobin 13.0 - 17.0 g/dL 29.5  62.1  30.8   Hematocrit 39.0 - 52.0 % 43.3  48.1  47.2   Platelets 150 - 400 K/uL 172  192  202        Latest Ref Rng & Units 01/26/2024    6:40 AM 01/25/2024    5:39 AM 01/24/2024    4:24 AM  CMP  Glucose 70 - 99 mg/dL 78  69  657   BUN 6 - 20 mg/dL 7  10  13    Creatinine 0.61 - 1.24 mg/dL 8.46  9.62  9.52   Sodium 135 - 145 mmol/L 139  136  138   Potassium 3.5 - 5.1 mmol/L 3.7  4.2  3.8   Chloride 98 - 111 mmol/L 104  102  104   CO2 22 - 32 mmol/L 27  24  26    Calcium  8.9 - 10.3 mg/dL 8.7  8.8  9.2   Total Protein 6.5 - 8.1 g/dL 6.1  7.0  6.8   Total Bilirubin 0.0 - 1.2 mg/dL 0.6  0.8  1.0   Alkaline Phos 38 - 126 U/L 116  155  176   AST 15 - 41 U/L 26  46  96   ALT 0 - 44 U/L 49  82  135      ECHOCARDIOGRAM COMPLETE Result Date: 01/24/2024    ECHOCARDIOGRAM REPORT   Patient Name:   Jimmy PAVLOVICH Sr. Date of Exam: 01/24/2024 Medical Rec #:  [id number]            Height:       71.0 in Accession #:    [id number]           Weight:       145.9 lb Date of Birth:  Jun 29, 1966            BSA:          1.844 m Patient Age:    57 years             BP:           100/76 mmHg Patient Gender: M                    HR:           104 bpm. Exam Location:  Inpatient Procedure: 2D Echo, Color Doppler and Cardiac Doppler (Both Spectral and Color            Flow Doppler were utilized during procedure). Indications:    Pulmonary Embolus  History:        Patient has prior history of Echocardiogram examinations. Risk                 Factors:Hypertension and  Dyslipidemia.  Sonographer:    Willey Harrier Referring Phys: [id number] Cherylene Corrente  Sonographer Comments: Technically difficult study due to poor echo windows, suboptimal parasternal window, suboptimal apical window and suboptimal subcostal window. Patient refused Definity contrast IMPRESSIONS  1. 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 Cannot exclude mural laminar left ventricular apical thrombus, but no mobile or protruberant thrombus is seen. Left ventricular ejection fraction, by estimation, is 20 to 25%. The left ventricle has severely decreased function. The left ventricle demonstrates regional wall motion abnormalities (see scoring diagram/findings for description). The left ventricular internal cavity size was mildly dilated.  Indeterminate diastolic filling due to E-A fusion.  2. Right ventricular systolic function is normal. The right ventricular size is normal. Tricuspid regurgitation signal is inadequate for assessing PA pressure.  3. The mitral valve is normal in structure. No evidence of mitral valve regurgitation. No evidence of mitral stenosis.  4. The aortic valve was not well visualized.  Aortic valve regurgitation is not visualized. No aortic stenosis is present.  5. The inferior vena cava is normal in size with greater than 50% respiratory variability, suggesting right atrial pressure of 3 mmHg. 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 Cannot exclude mural laminar left ventricular apical thrombus, but no mobile or protruberant thrombus is seen. Left ventricular ejection fraction, by estimation, is 20 to 25%. The left ventricle has severely decreased function. The left ventricle demonstrates regional wall motion abnormalities. The left ventricular internal cavity size was mildly dilated. There is no left ventricular hypertrophy. Indeterminate diastolic filling due to E-A fusion.  LV Wall Scoring: The mid inferoseptal segment, apical septal segment, apical anterior segment, apical inferior segment, and apex are aneurysmal. The anterior wall, antero-lateral wall, mid anteroseptal segment, and apical lateral segment are akinetic. The inferior wall, posterior wall, basal anteroseptal segment, and basal inferoseptal segment are normal. Right Ventricle: The right ventricular size is normal. No increase in right ventricular wall thickness. Right ventricular systolic function is normal. Tricuspid regurgitation signal is inadequate for assessing PA pressure. Left Atrium: Left atrial size was normal in size. Right Atrium: Right atrial size was normal in size. Pericardium: Trivial pericardial effusion is present. The pericardial effusion is surrounding the apex. Mitral Valve: The mitral valve is normal in structure. No evidence of mitral valve regurgitation. No evidence of mitral valve stenosis. MV peak gradient, 3.7 mmHg. The mean mitral valve gradient is 2.0 mmHg. Tricuspid Valve: The tricuspid valve is grossly normal. Tricuspid valve regurgitation is not demonstrated. Aortic Valve: The aortic valve was not well visualized.  Aortic valve regurgitation is not visualized. No aortic stenosis is present. Aortic valve peak gradient measures 3.2 mmHg. Pulmonic Valve: The pulmonic valve was not well visualized. Aorta: The aortic root was not well visualized. Venous: The inferior vena cava is normal in size with greater than 50% respiratory variability, suggesting right atrial pressure of 3 mmHg. IAS/Shunts: No atrial level shunt detected by color flow Doppler. Additional Comments: A device lead is visualized in the right ventricle.  IVC IVC diam: 1.30 cm LEFT ATRIUM           Index       RIGHT ATRIUM          Index LA Vol (A4C): 10.9 ml 5.91 ml/m  RA Area:     9.65 cm  RA Volume:   21.60 ml 11.71 ml/m  AORTIC VALVE AV Vmax:      89.00 cm/s AV Peak Grad: 3.2 mmHg LVOT Vmax:    67.90 cm/s LVOT Vmean:   44.300 cm/s LVOT VTI:     0.094 m MITRAL VALVE MV Peak grad: 3.7 mmHg  SHUNTS MV Mean grad: 2.0 mmHg  Systemic VTI: 0.09 m MV Vmax:      0.97 m/s MV Vmean:     63.9 cm/s Mihai Croitoru MD Electronically signed by Luana Rumple MD Signature Date/Time: 01/24/2024/10:35:31 AM    Final    US  Abdomen Limited RUQ (LIVER/GB) Result Date: 01/24/2024 CLINICAL DATA:  Elevated liver function tests EXAM: ULTRASOUND ABDOMEN LIMITED RIGHT UPPER QUADRANT COMPARISON:  CT chest abdomen pelvis 01/03/2024 FINDINGS: Gallbladder: Surgically absent Common bile duct: Diameter: 8 mm Liver: Parenchymal echogenicity: Within normal limits Contours: Normal Lesions: None Portal vein: Patent.  Hepatopetal flow Other: None. IMPRESSION: No significant sonographic abnormality of the liver. Electronically Signed   By: Elester Grim M.D.   On: 01/24/2024 07:29   CT CHEST ABDOMEN PELVIS WO CONTRAST Result Date: 01/23/2024 CLINICAL DATA:  Left hemidiaphragm elevation with bowel, not sure if this is chronic or new. No history of diaphragmatic issues. Query pneumonia. EXAM: CT CHEST, ABDOMEN AND PELVIS WITHOUT CONTRAST TECHNIQUE: Multidetector  CT imaging of the chest, abdomen and pelvis was performed following the standard protocol without IV contrast. RADIATION DOSE REDUCTION: This exam was performed according to the departmental dose-optimization program which includes automated exposure control, adjustment of the mA and/or kV according to patient size and/or use of iterative reconstruction technique. COMPARISON:  Same day perfusion scan; same day chest radiograph; CT chest 10/20/2023; CT abdomen pelvis 10/21/2017 FINDINGS: CT CHEST FINDINGS Cardiovascular: Aortic and coronary artery atherosclerosis. Normal caliber thoracic aorta. Trace pericardial effusion. Right chest wall pacemaker. Mediastinum/Nodes: Nodular debris in the right posterior trachea, endotracheal nodule considered less likely but not excluded (series 3/image 19). Unremarkable esophagus. No thoracic adenopathy noting decreased sensitivity of noncontrast exam. Lungs/Pleura: Advanced emphysema. Elevated left hemidiaphragm with lingular and left lower lobe compressive atelectasis. The elevation of the left hemidiaphragm is unchanged dating back to 2010. Pneumonia is not excluded. Multifocal areas of parenchymal scarring with bronchiectasis most prominent in the right upper lobe similar to 10/20/2023. No pleural effusion or pneumothorax. Musculoskeletal: No acute fracture. CT ABDOMEN PELVIS FINDINGS Hepatobiliary: Unremarkable noncontrast appearance of the liver. Cholecystectomy. No biliary dilation. Pancreas: Unremarkable. Spleen: Unremarkable. Adrenals/Urinary Tract: Normal adrenal glands. Bilobed mildly hyperdense mass arising from the posterior left kidney measures 1.9 cm (Hounsfield units 75). This is compatible with a Bosniak 2 cyst. No follow-up is recommended. Multiple additional subcentimeter hyperdense lesions in the left kidney are incompletely evaluated due to size however are statistically likely to represent cysts. Punctate nonobstructing left nephrolithiasis. No follow-up  recommended. Atrophic right kidney. Chronic right hydronephrosis and dilation of the proximal right ureter. Chronic stones or calcification in the distal right ureter. Unremarkable bladder. Stomach/Bowel: Stomach is within normal limits. No bowel obstruction or bowel wall thickening. Partial right colectomy. Anastomosis in the right upper quadrant. Small bowel-small bowel anastomosis in the mid abdomen. Vascular/Lymphatic: Aortic atherosclerosis. No enlarged abdominal or pelvic lymph nodes. Reproductive: No acute abnormality. Other: No free intraperitoneal fluid or air. Musculoskeletal: No acute fracture.  Demineralization. IMPRESSION: 1. Elevated left hemidiaphragm with lingular and left lower lobe compressive atelectasis. Pneumonia is not excluded. The elevation of the left hemidiaphragm is unchanged dating back to 2010. 2. Nodular debris in the right posterior  trachea, endotracheal nodule considered less likely but not excluded. Continued attention on follow-up. 3. No acute abnormality in the abdomen or pelvis. 4. Chronic right hydronephrosis and dilation of the proximal right ureter. Chronic stones or calcification in the distal right ureter. 5. Aortic Atherosclerosis (ICD10-I70.0) and advanced emphysema (ICD10-J43.9). Electronically Signed   By: Rozell Cornet M.D.   On: 01/23/2024 23:58   NM Pulmonary Perfusion Result Date: 01/23/2024 CLINICAL DATA:  Pulmonary embolus suspected with high probability. Shortness of breath. EXAM: NUCLEAR MEDICINE PERFUSION LUNG SCAN TECHNIQUE: Perfusion images were obtained in multiple projections after intravenous injection of radiopharmaceutical. Ventilation scans intentionally deferred if perfusion scan and chest x-ray adequate for interpretation during COVID 19 epidemic. RADIOPHARMACEUTICALS:  3.9 mCi Tc-57m MAA IV COMPARISON:  Chest radiograph 01/23/2024.  CT chest 10/20/2023 FINDINGS: Pulmonary perfusion images demonstrate a large defect at the left lung base  corresponding to chest radiographic abnormalities including consolidation and volume loss. There is a segmental defect in the posterior segment of the right upper lung and a segmental defect in the anterior segment of the right upper lung. Additional subsegmental defect in the left upper lung. Some of this could be accounted for by severe emphysematous changes are noted in the lungs on chest radiograph. However, the finding of 2 or more segmental defects in the setting of high clinical probability is suggestive of high probability of pulmonary embolus. IMPRESSION: Two or greater segmental perfusion defects in the lungs consistent with high probability of pulmonary embolus. These results were called by telephone at the time of interpretation on 01/23/2024 at 4:08 pm to provider BROOKE SMALL , who verbally acknowledged these results. Electronically Signed   By: Boyce Byes M.D.   On: 01/23/2024 16:13   DG Chest 2 View Result Date: 01/23/2024 CLINICAL DATA:  Chest pain. EXAM: CHEST - 2 VIEW COMPARISON:  Chest CT 10/20/2023 FINDINGS: The cardiac silhouette, mediastinal and hilar contours are within normal limits and stable. Right ventricular pacer wire in good position. 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 Significant underlying emphysematous changes and pulmonary scarring but no definite acute superimposed pulmonary process. IMPRESSION: 1. 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 2. Significant underlying emphysematous changes and pulmonary scarring but no definite acute superimposed pulmonary process. Electronically Signed   By: Marrian Siva M.D.   On: 01/23/2024 15:10     Signed: Jose Ngo, MD Internal Medicine Resident, PGY-1 [facility]  Pager: [phone] 2:14 PM, 01/26/2024

## 2024-01-26 NOTE — Plan of Care (Signed)
   Problem: Health Behavior/Discharge Planning: Goal: Ability to manage health-related needs will improve Outcome: Progressing   Problem: Clinical Measurements: Goal: Ability to maintain clinical measurements within normal limits will improve Outcome: Progressing Goal: Will remain free from infection Outcome: Progressing Goal: Diagnostic test results will improve Outcome: Progressing Goal: Respiratory complications will improve Outcome: Progressing

## 2024-01-26 NOTE — TOC Transition Note (Signed)
 Transition of Care [facility]) - Discharge Note   Patient Details  Name: Jimmy SPIRES Sr. MRN: [id number] Date of Birth: 1965/12/21  [facility]) CM/SW Contact:  Jonathan Neighbor, RN Phone Number: 01/26/2024, 2:11 PM   Clinical Narrative:     Pt is discharging home with home health though [facility]. Information on the AVS. [facility] will contact him for the first home visit. Walker for home is in the room. Pts son to transport him home.  Final next level of care: Home w Home Health Services Barriers to Discharge: No Barriers Identified   Patient Goals and CMS Choice   CMS Medicare.gov Compare Post Acute Care list provided to:: Patient Choice offered to / list presented to : Patient      Discharge Placement                       Discharge Plan and Services Additional resources added to the After Visit Summary for     Discharge Planning Services: CM Consult Post Acute Care Choice: Home Health, Durable Medical Equipment          DME Arranged: Walker rolling DME Agency: [facility] Date DME Agency Contacted: 01/25/24   Representative spoke with at DME Agency: Zack HH Arranged: PT, OT HH Agency: [facility] Date [facility] Agency Contacted: 01/25/24   Representative spoke with at [facility] Agency: Loetta Ringer  Social Drivers of Health (SDOH) Interventions SDOH Screenings   Food Insecurity: No Food Insecurity (01/23/2024)  Housing: Low Risk  (01/25/2024)  Transportation Needs: No Transportation Needs (01/25/2024)  Utilities: Not At Risk (01/23/2024)  Alcohol Screen: Low Risk  (01/25/2024)  Financial Resource Strain: Low Risk  (01/25/2024)  Tobacco Use: High Risk (01/23/2024)     Readmission Risk Interventions     No data to display

## 2024-01-27 NOTE — Progress Notes (Signed)
 HEART & VASCULAR TRANSITION OF CARE CONSULT NOTE     Referring Physician: PCP: Barbar Levine, MD  EP: Dr. Lawana Pray Primary Cardiologist: Dr. Lafayette Pierre.    HPI: Referred to clinic by *** for heart failure consultation.   Jimmy Nelson. is a 58 y.o. male history of CAD s/p anterolateral STEMI in August 2020 s/p PCI of LAD, CHF with reduced LVEF 20 to 25% on recent echocardiogram July 2022, history of V-fib arrest s/p Abbott single-chamber ICD implant in December 2020, COPD O2 dependent, CKD stage III, Crohn's disease s/p bowel resection surgeries in the past, cholelithiasis, seizure disorder, and previous tobacco use.  Admitted 4/25 with CP. Found to + right PE, started on [facility]. Code blue called on 4/28 but patient never lost pulses, more nonresponsive event due to symptomatic bradycardia. Echo showed LVEF 20-25% (stable from 03/2021), RWMA, RV normal. PVCs noted on 4/30. GDMT titrated and he was discharged home, weight 147 lbs.     Cardiac Testing    Past Medical History:  Diagnosis Date   Acute anterolateral myocardial infarction (HCC) 05/05/2019   Acute MI, anterolateral wall, initial episode of care (HCC) 05/05/2019   S/P Prox LAD 3.5x20 and 3.0x12 Synergy 100% to 0% on 05/05/2019     Alcohol abuse 12/30/2012   Adequate for discharge     Amnesia    Anemia    Anxiety and depression    Chronic systolic heart failure (HCC) 04/14/2020   Congestive heart failure, hypertensive (HCC)    COPD (chronic obstructive pulmonary disease) (HCC)    Crohn's disease (HCC) Deteriorating disk   Crohn's disease (HCC)    Depression    Encounter for assessment of implantable cardioverter-defibrillator (ICD) 04/14/2020   Esophageal reflux 01/20/2013   GERD (gastroesophageal reflux disease)    History of lung cancer    History of MI (myocardial infarction) 05/05/2019   History of stroke    2   Hyperlipidemia    Hypertension    Hypogonadism in male    ICD - Single chamber Abbott  Vascular GALLANT VR ICD 09/10/2019 09/10/2019   Kidney disease 01/21/2022   chronic   Lung cancer (HCC)    Major depressive disorder    Mental disorder    Neuropathy    Nonischemic cardiomyopathy (HCC) 08/22/2019   Oxygen  dependent    Rheumatoid arthritis of left wrist (HCC)    Seizure (HCC)    No recent seizures; continue Depakote    Sleep apnea    Stage 3 chronic kidney disease (HCC)    Stroke (HCC)    Tobacco use disorder, continuous 05/05/2019    Current Outpatient Medications  Medication Sig Dispense Refill   acetaminophen  (TYLENOL ) 500 MG tablet Take 1 tablet (500 mg total) by mouth every 6 (six) hours as needed for moderate pain (pain score 4-6) or mild pain (pain score 1-3). 60 tablet 0   Albuterol  Sulfate (PROAIR  RESPICLICK) 108 (90 Base) MCG/ACT AEPB Inhale 2 puffs into the lungs every 6 (six) hours as needed (shortness of breath and wheezing).     APIXABAN  (ELIQUIS ) VTE STARTER PACK (10MG  AND 5MG ) Take  2 tablets (10 mg) by mouth 2 (two) times daily for 7 days, THEN 1 tablet (5 mg) 2 (two) times daily. 74 tablet 0   [Paused] aspirin  EC 81 MG tablet Take 162 mg by mouth in the morning. Swallow whole.     [Paused] atorvastatin  (LIPITOR ) 80 MG tablet Take 1 tablet (80 mg total) by mouth daily. (Patient taking differently:  Take 80 mg by mouth daily at 6 PM.) 90 tablet 2   budeson-glycopyrrolate -formoterol  (BREZTRI  AEROSPHERE) 160-9-4.8 MCG/ACT AERO inhaler Inhale 2 puffs into the lungs in the morning and at bedtime. 10.7 g 0   Coenzyme Q10 (COQ10) 100 MG CAPS Take 100 mg by mouth in the morning.     divalproex  (DEPAKOTE ) 250 MG DR tablet Take 1 tablet (250 mg total) by mouth in the morning AND 2 tablets (500 mg total) at bedtime. 90 tablet 0   docusate sodium (COLACE) 100 MG capsule Take 200 mg by mouth in the morning.     EPINEPHrine 0.3 mg/0.3 mL IJ SOAJ injection Inject 0.3 mg into the muscle as needed for anaphylaxis.     ezetimibe  (ZETIA ) 10 MG tablet Take 1 tablet (10 mg  total) by mouth daily. 30 tablet 0   famotidine  (PEPCID ) 20 MG tablet Take 1 tablet (20 mg total) by mouth at bedtime. 30 tablet 0   folic acid  (FOLVITE ) 1 MG tablet Take 1 tablet (1 mg total) by mouth daily. 30 tablet 0   gabapentin  (NEURONTIN ) 600 MG tablet Take 600 mg by mouth in the morning, at noon, and at bedtime.     ipratropium-albuterol  (DUONEB) 0.5-2.5 (3) MG/3ML SOLN Inhale 3 mLs into the lungs See admin instructions. Nebulize 3 ml's and inhale into the lungs every 4-6 hours or as needed for shortness of breath and/or wheezing     magnesium  oxide (MAG-OX) 400 MG tablet Take 400 mg by mouth in the morning, at noon, and at bedtime.     metoprolol  succinate (TOPROL -XL) 25 MG 24 hr tablet Take 0.5 tablets (12.5 mg total) by mouth 2 (two) times daily. 30 tablet 0   Multiple Vitamin (MULTIVITAMIN WITH MINERALS) TABS tablet Take 1 tablet by mouth daily. 30 tablet 0   nicotine  (NICODERM CQ  - DOSED IN MG/24 HOURS) 21 mg/24hr patch Place 21 mg onto the skin daily.     nitroGLYCERIN  (NITROSTAT ) 0.4 MG SL tablet DISSOLVE 1 TABLET UNDER THE TONGUE EVERY 5 MINUTES AS NEEDED FOR CHEST PAIN. (MAXIMUM OF 3 DOSES) 25 tablet 3   ondansetron  (ZOFRAN -ODT) 4 MG disintegrating tablet Take 1 tablet (4 mg total) by mouth every 8 (eight) hours as needed for nausea or vomiting. 10 tablet 0   pantoprazole  (PROTONIX ) 40 MG tablet Take 1 tablet (40 mg total) by mouth 2 (two) times daily. Do not take at the same time as famotidine  60 tablet 0   pyridOXINE (VITAMIN B-6) 100 MG tablet Take 100 mg by mouth in the morning.     [Paused] sacubitril-valsartan (ENTRESTO ) 49-51 MG TAKE ONE TABLET BY MOUTH TWICE DAILY (Patient taking differently: Take 1 tablet by mouth in the morning and at bedtime.) 180 tablet 2   sertraline  (ZOLOFT ) 50 MG tablet Take 3 tablets (150 mg total) by mouth daily. 90 tablet 0   thiamine  (VITAMIN B1) 100 MG tablet Take 1 tablet (100 mg total) by mouth daily. 30 tablet 0   vitamin B-12 (CYANOCOBALAMIN)  500 MCG tablet Take 500 mcg by mouth in the morning.     Zinc 50 MG TABS Take 50 mg by mouth in the morning.     No current facility-administered medications for this visit.    Allergies  Allergen Reactions   Bee Venom Anaphylaxis   Iodinated Contrast Media Anaphylaxis and Other (See Comments)    It is noted that pt is allergic to Iodinated contrast media-iv dye,oral contrast   Chantix [Varenicline Tartrate] Other (See Comments)    "  Seizures"      Social History   Socioeconomic History   Marital status: Significant Other    Spouse name: Not on file   Number of children: 5   Years of education: Not on file   Highest education level: 9th grade  Occupational History   Occupation: disability  Tobacco Use   Smoking status: Every Day    Types: Cigarettes   Smokeless tobacco: Current    Types: Snuff   Tobacco comments:    3 cigarettes daily   Vaping Use   Vaping status: Never Used  Substance and Sexual Activity   Alcohol use: Yes    Comment: Occasional   Drug use: Yes    Types: Cocaine, Marijuana    Comment: Former Cocaine user   Sexual activity: Yes  Other Topics Concern   Not on file  Social History Narrative   ** Merged History Encounter **       Social Drivers of Health   Financial Resource Strain: Low Risk  (01/25/2024)   Overall Financial Resource Strain (CARDIA)    Difficulty of Paying Living Expenses: Not very hard  Food Insecurity: No Food Insecurity (01/23/2024)   Hunger Vital Sign    Worried About Running Out of Food in the Last Year: Never true    Ran Out of Food in the Last Year: Never true  Transportation Needs: No Transportation Needs (01/25/2024)   PRAPARE - Administrator, Civil Service (Medical): No    Lack of Transportation (Non-Medical): No  Physical Activity: Not on file  Stress: Not on file  Social Connections: Not on file  Intimate Partner Violence: Not At Risk (01/23/2024)   Humiliation, Afraid, Rape, and Kick questionnaire     Fear of Current or Ex-Partner: No    Emotionally Abused: No    Physically Abused: No    Sexually Abused: No      Family History  Problem Relation Age of Onset   Diabetes Brother    Drug abuse Daughter        heroin addict    There were no vitals filed for this visit.  PHYSICAL EXAM: General:  Well appearing. No respiratory difficulty HEENT: normal Neck: supple. no JVD. Carotids 2+ bilat; no bruits. No lymphadenopathy or thryomegaly appreciated. Cor: PMI nondisplaced. Regular rate & rhythm. No rubs, gallops or murmurs. Lungs: clear Abdomen: soft, nontender, nondistended. No hepatosplenomegaly. No bruits or masses. Good bowel sounds. Extremities: no cyanosis, clubbing, rash, edema Neuro: alert & oriented x 3, cranial nerves grossly intact. moves all 4 extremities w/o difficulty. Affect pleasant.  ECG:   ASSESSMENT & PLAN:  NYHA *** GDMT  Diuretic- BB- Ace/ARB/ARNI MRA SGLT2i    Referred to HFSW (PCP, Medications, Transportation, ETOH Abuse, Drug Abuse, Insurance, Financial ): Yes or No Refer to Pharmacy: Yes or No Refer to Home Health: Yes on No Refer to [facility]: Yes or no  Refer to General Cardiology: Yes or No  Follow up

## 2024-01-29 DIAGNOSIS — N182 Chronic kidney disease, stage 2 (mild): Secondary | ICD-10-CM | POA: Diagnosis not present

## 2024-01-29 DIAGNOSIS — Z9981 Dependence on supplemental oxygen: Secondary | ICD-10-CM | POA: Diagnosis not present

## 2024-01-29 DIAGNOSIS — I5020 Unspecified systolic (congestive) heart failure: Secondary | ICD-10-CM | POA: Diagnosis not present

## 2024-01-29 DIAGNOSIS — I2602 Saddle embolus of pulmonary artery with acute cor pulmonale: Secondary | ICD-10-CM | POA: Diagnosis not present

## 2024-01-29 DIAGNOSIS — G8929 Other chronic pain: Secondary | ICD-10-CM | POA: Diagnosis not present

## 2024-01-29 DIAGNOSIS — K219 Gastro-esophageal reflux disease without esophagitis: Secondary | ICD-10-CM | POA: Diagnosis not present

## 2024-01-29 DIAGNOSIS — I130 Hypertensive heart and chronic kidney disease with heart failure and stage 1 through stage 4 chronic kidney disease, or unspecified chronic kidney disease: Secondary | ICD-10-CM | POA: Diagnosis not present

## 2024-01-29 DIAGNOSIS — J441 Chronic obstructive pulmonary disease with (acute) exacerbation: Secondary | ICD-10-CM | POA: Diagnosis not present

## 2024-01-29 DIAGNOSIS — Z7901 Long term (current) use of anticoagulants: Secondary | ICD-10-CM | POA: Diagnosis not present

## 2024-01-29 DIAGNOSIS — I69353 Hemiplegia and hemiparesis following cerebral infarction affecting right non-dominant side: Secondary | ICD-10-CM | POA: Diagnosis not present

## 2024-01-29 DIAGNOSIS — I2510 Atherosclerotic heart disease of native coronary artery without angina pectoris: Secondary | ICD-10-CM | POA: Diagnosis not present

## 2024-01-29 DIAGNOSIS — E785 Hyperlipidemia, unspecified: Secondary | ICD-10-CM | POA: Diagnosis not present

## 2024-01-29 DIAGNOSIS — I959 Hypotension, unspecified: Secondary | ICD-10-CM | POA: Diagnosis not present

## 2024-01-29 DIAGNOSIS — D509 Iron deficiency anemia, unspecified: Secondary | ICD-10-CM | POA: Diagnosis not present

## 2024-01-29 DIAGNOSIS — Z720 Tobacco use: Secondary | ICD-10-CM | POA: Diagnosis not present

## 2024-01-29 DIAGNOSIS — K5900 Constipation, unspecified: Secondary | ICD-10-CM | POA: Diagnosis not present

## 2024-01-29 DIAGNOSIS — I252 Old myocardial infarction: Secondary | ICD-10-CM | POA: Diagnosis not present

## 2024-01-29 DIAGNOSIS — Z95810 Presence of automatic (implantable) cardiac defibrillator: Secondary | ICD-10-CM | POA: Diagnosis not present

## 2024-01-30 ENCOUNTER — Telehealth (HOSPITAL_COMMUNITY): Payer: Self-pay

## 2024-01-30 DIAGNOSIS — D6869 Other thrombophilia: Secondary | ICD-10-CM | POA: Diagnosis not present

## 2024-01-30 DIAGNOSIS — Z7689 Persons encountering health services in other specified circumstances: Secondary | ICD-10-CM | POA: Diagnosis not present

## 2024-01-30 DIAGNOSIS — Z6821 Body mass index (BMI) 21.0-21.9, adult: Secondary | ICD-10-CM | POA: Diagnosis not present

## 2024-01-30 DIAGNOSIS — I5020 Unspecified systolic (congestive) heart failure: Secondary | ICD-10-CM | POA: Diagnosis not present

## 2024-01-30 DIAGNOSIS — I2699 Other pulmonary embolism without acute cor pulmonale: Secondary | ICD-10-CM | POA: Diagnosis not present

## 2024-01-30 NOTE — Telephone Encounter (Signed)
 Called to confirm/remind patient of their appointment at the [facility] on 01/31/24.   Appointment:   [x] Confirmed  [] Left mess   [] No answer/No voice mail  [] VM Full/unable to leave message  [] Phone not in service  Patient reminded to bring all medications and/or complete list.  Confirmed patient has transportation. Gave directions, instructed to utilize valet parking.

## 2024-01-30 NOTE — Telephone Encounter (Signed)
 Called to confirm/remind patient of their appointment at the [facility] on 2:45 01/31/24.   Appointment:   [] Confirmed  [] Left mess   [] No answer/No voice mail  [] VM Full/unable to leave message  [] Phone not in service  Patient reminded to bring all medications and/or complete list.  Confirmed patient has transportation. Gave directions, instructed to utilize valet parking.

## 2024-01-31 ENCOUNTER — Encounter (HOSPITAL_COMMUNITY): Payer: Self-pay

## 2024-01-31 ENCOUNTER — Ambulatory Visit (HOSPITAL_COMMUNITY)
Admit: 2024-01-31 | Discharge: 2024-01-31 | Disposition: A | Discharge status: Home / Self Care | Attending: Family Medicine | Admitting: Family Medicine

## 2024-01-31 VITALS — BP 102/74 | HR 81 | Ht 71.0 in | Wt 146.6 lb

## 2024-01-31 DIAGNOSIS — I2782 Chronic pulmonary embolism: Secondary | ICD-10-CM

## 2024-01-31 DIAGNOSIS — I130 Hypertensive heart and chronic kidney disease with heart failure and stage 1 through stage 4 chronic kidney disease, or unspecified chronic kidney disease: Secondary | ICD-10-CM | POA: Insufficient documentation

## 2024-01-31 DIAGNOSIS — J439 Emphysema, unspecified: Secondary | ICD-10-CM | POA: Insufficient documentation

## 2024-01-31 DIAGNOSIS — Z95810 Presence of automatic (implantable) cardiac defibrillator: Secondary | ICD-10-CM | POA: Insufficient documentation

## 2024-01-31 DIAGNOSIS — N183 Chronic kidney disease, stage 3 unspecified: Secondary | ICD-10-CM | POA: Insufficient documentation

## 2024-01-31 DIAGNOSIS — F17209 Nicotine dependence, unspecified, with unspecified nicotine-induced disorders: Secondary | ICD-10-CM

## 2024-01-31 DIAGNOSIS — I2510 Atherosclerotic heart disease of native coronary artery without angina pectoris: Secondary | ICD-10-CM | POA: Diagnosis not present

## 2024-01-31 DIAGNOSIS — K5090 Crohn's disease, unspecified, without complications: Secondary | ICD-10-CM | POA: Diagnosis not present

## 2024-01-31 DIAGNOSIS — Z955 Presence of coronary angioplasty implant and graft: Secondary | ICD-10-CM | POA: Insufficient documentation

## 2024-01-31 DIAGNOSIS — Z86711 Personal history of pulmonary embolism: Secondary | ICD-10-CM | POA: Insufficient documentation

## 2024-01-31 DIAGNOSIS — F17210 Nicotine dependence, cigarettes, uncomplicated: Secondary | ICD-10-CM | POA: Diagnosis not present

## 2024-01-31 DIAGNOSIS — I252 Old myocardial infarction: Secondary | ICD-10-CM | POA: Insufficient documentation

## 2024-01-31 DIAGNOSIS — Z79899 Other long term (current) drug therapy: Secondary | ICD-10-CM | POA: Insufficient documentation

## 2024-01-31 DIAGNOSIS — Z9981 Dependence on supplemental oxygen: Secondary | ICD-10-CM | POA: Diagnosis not present

## 2024-01-31 DIAGNOSIS — I5022 Chronic systolic (congestive) heart failure: Secondary | ICD-10-CM | POA: Insufficient documentation

## 2024-01-31 DIAGNOSIS — Z7901 Long term (current) use of anticoagulants: Secondary | ICD-10-CM | POA: Insufficient documentation

## 2024-01-31 DIAGNOSIS — G40909 Epilepsy, unspecified, not intractable, without status epilepticus: Secondary | ICD-10-CM | POA: Diagnosis not present

## 2024-01-31 DIAGNOSIS — I255 Ischemic cardiomyopathy: Secondary | ICD-10-CM | POA: Diagnosis not present

## 2024-01-31 DIAGNOSIS — I959 Hypotension, unspecified: Secondary | ICD-10-CM | POA: Diagnosis not present

## 2024-01-31 LAB — BASIC METABOLIC PANEL WITH GFR
Anion gap: 11 (ref 5–15)
BUN: 18 mg/dL (ref 6–20)
CO2: 27 mmol/L (ref 22–32)
Calcium: 10.2 mg/dL (ref 8.9–10.3)
Chloride: 96 mmol/L — ABNORMAL LOW (ref 98–111)
Creatinine, Ser: 1.35 mg/dL — ABNORMAL HIGH (ref 0.61–1.24)
GFR, Estimated: >60 mL/min (ref >60)
Glucose, Bld: 73 mg/dL (ref 70–99)
Potassium: 4.5 mmol/L (ref 3.5–5.1)
Sodium: 134 mmol/L — ABNORMAL LOW (ref 135–145)

## 2024-01-31 LAB — CBC
HCT: 54.4 % — ABNORMAL HIGH (ref 39.0–52.0)
Hemoglobin: 17.3 g/dL — ABNORMAL HIGH (ref 13.0–17.0)
MCH: 28.4 pg (ref 26.0–34.0)
MCHC: 31.8 g/dL (ref 30.0–36.0)
MCV: 89.2 fL (ref 80.0–100.0)
Platelets: 263 10*3/uL (ref 150–400)
RBC: 6.1 MIL/uL — ABNORMAL HIGH (ref 4.22–5.81)
RDW: 14 % (ref 11.5–15.5)
WBC: 11.2 10*3/uL — ABNORMAL HIGH (ref 4.0–10.5)
nRBC: 0 % (ref 0.0–0.2)

## 2024-01-31 LAB — BRAIN NATRIURETIC PEPTIDE: B Natriuretic Peptide: 241.6 pg/mL — ABNORMAL HIGH (ref 0.0–100.0)

## 2024-01-31 MED ORDER — LOSARTAN POTASSIUM 25 MG PO TABS: 12.5000 mg | ORAL_TABLET | Freq: Every day | ORAL | 3 refills | Status: AC

## 2024-01-31 NOTE — Patient Instructions (Signed)
 Medication Changes:  STOP TAKING ENTRESTO    START: LOSARTAN  12.5MG  ONCE DAILY AT BEDTIME   Lab Work:  Labs done today, your results will be available in MyChart, we will contact you for abnormal readings  Follow-Up in: 2-3 weeks with TOC as scheduled   At the [facility], you and your health needs are our priority. We have a designated team specialized in the treatment of Heart Failure. This Care Team includes your primary Heart Failure Specialized Cardiologist (physician), Advanced Practice Providers (APPs- Physician Assistants and Nurse Practitioners), and Pharmacist who all work together to provide you with the care you need, when you need it.   You may see any of the following providers on your designated Care Team at your next follow up:  Dr. Jules Oar Dr. Peder Bourdon Dr. Alwin Baars Dr. Judyth Nunnery Nieves Bars, NP Ruddy Corral, [place] [facility] Justin, [place] Dennise Fitz, NP [place] Lee, NP Luster Salters, PharmD   Please be sure to bring in all your medications bottles to every appointment.   Need to Contact Us :  If you have any questions or concerns before your next appointment please send us  a message through [place] or call our office at [phone].    TO LEAVE A MESSAGE FOR THE NURSE SELECT OPTION 2, PLEASE LEAVE A MESSAGE INCLUDING: YOUR NAME DATE OF BIRTH CALL BACK NUMBER REASON FOR CALL**this is important as we prioritize the call backs  YOU WILL RECEIVE A CALL BACK THE SAME DAY AS LONG AS YOU CALL BEFORE 4:00 PM

## 2024-02-01 ENCOUNTER — Encounter (HOSPITAL_COMMUNITY): Payer: Self-pay

## 2024-02-03 ENCOUNTER — Encounter (HOSPITAL_COMMUNITY): Payer: Self-pay

## 2024-02-03 DIAGNOSIS — D509 Iron deficiency anemia, unspecified: Secondary | ICD-10-CM | POA: Diagnosis not present

## 2024-02-03 DIAGNOSIS — K219 Gastro-esophageal reflux disease without esophagitis: Secondary | ICD-10-CM | POA: Diagnosis not present

## 2024-02-03 DIAGNOSIS — I5020 Unspecified systolic (congestive) heart failure: Secondary | ICD-10-CM | POA: Diagnosis not present

## 2024-02-03 DIAGNOSIS — Z9981 Dependence on supplemental oxygen: Secondary | ICD-10-CM | POA: Diagnosis not present

## 2024-02-03 DIAGNOSIS — I252 Old myocardial infarction: Secondary | ICD-10-CM | POA: Diagnosis not present

## 2024-02-03 DIAGNOSIS — I2602 Saddle embolus of pulmonary artery with acute cor pulmonale: Secondary | ICD-10-CM | POA: Diagnosis not present

## 2024-02-03 DIAGNOSIS — Z720 Tobacco use: Secondary | ICD-10-CM | POA: Diagnosis not present

## 2024-02-03 DIAGNOSIS — E785 Hyperlipidemia, unspecified: Secondary | ICD-10-CM | POA: Diagnosis not present

## 2024-02-03 DIAGNOSIS — K5900 Constipation, unspecified: Secondary | ICD-10-CM | POA: Diagnosis not present

## 2024-02-03 DIAGNOSIS — J441 Chronic obstructive pulmonary disease with (acute) exacerbation: Secondary | ICD-10-CM | POA: Diagnosis not present

## 2024-02-03 DIAGNOSIS — Z7901 Long term (current) use of anticoagulants: Secondary | ICD-10-CM | POA: Diagnosis not present

## 2024-02-03 DIAGNOSIS — I959 Hypotension, unspecified: Secondary | ICD-10-CM | POA: Diagnosis not present

## 2024-02-03 DIAGNOSIS — I69353 Hemiplegia and hemiparesis following cerebral infarction affecting right non-dominant side: Secondary | ICD-10-CM | POA: Diagnosis not present

## 2024-02-03 DIAGNOSIS — G8929 Other chronic pain: Secondary | ICD-10-CM | POA: Diagnosis not present

## 2024-02-03 DIAGNOSIS — I130 Hypertensive heart and chronic kidney disease with heart failure and stage 1 through stage 4 chronic kidney disease, or unspecified chronic kidney disease: Secondary | ICD-10-CM | POA: Diagnosis not present

## 2024-02-03 DIAGNOSIS — Z95810 Presence of automatic (implantable) cardiac defibrillator: Secondary | ICD-10-CM | POA: Diagnosis not present

## 2024-02-03 DIAGNOSIS — I2510 Atherosclerotic heart disease of native coronary artery without angina pectoris: Secondary | ICD-10-CM | POA: Diagnosis not present

## 2024-02-03 DIAGNOSIS — N182 Chronic kidney disease, stage 2 (mild): Secondary | ICD-10-CM | POA: Diagnosis not present

## 2024-02-07 DIAGNOSIS — I2510 Atherosclerotic heart disease of native coronary artery without angina pectoris: Secondary | ICD-10-CM | POA: Diagnosis not present

## 2024-02-07 DIAGNOSIS — I2602 Saddle embolus of pulmonary artery with acute cor pulmonale: Secondary | ICD-10-CM | POA: Diagnosis not present

## 2024-02-07 DIAGNOSIS — I252 Old myocardial infarction: Secondary | ICD-10-CM | POA: Diagnosis not present

## 2024-02-07 DIAGNOSIS — N182 Chronic kidney disease, stage 2 (mild): Secondary | ICD-10-CM | POA: Diagnosis not present

## 2024-02-07 DIAGNOSIS — I130 Hypertensive heart and chronic kidney disease with heart failure and stage 1 through stage 4 chronic kidney disease, or unspecified chronic kidney disease: Secondary | ICD-10-CM | POA: Diagnosis not present

## 2024-02-07 DIAGNOSIS — Z720 Tobacco use: Secondary | ICD-10-CM | POA: Diagnosis not present

## 2024-02-07 DIAGNOSIS — K219 Gastro-esophageal reflux disease without esophagitis: Secondary | ICD-10-CM | POA: Diagnosis not present

## 2024-02-07 DIAGNOSIS — I69353 Hemiplegia and hemiparesis following cerebral infarction affecting right non-dominant side: Secondary | ICD-10-CM | POA: Diagnosis not present

## 2024-02-07 DIAGNOSIS — I5020 Unspecified systolic (congestive) heart failure: Secondary | ICD-10-CM | POA: Diagnosis not present

## 2024-02-07 DIAGNOSIS — Z7901 Long term (current) use of anticoagulants: Secondary | ICD-10-CM | POA: Diagnosis not present

## 2024-02-07 DIAGNOSIS — K5900 Constipation, unspecified: Secondary | ICD-10-CM | POA: Diagnosis not present

## 2024-02-07 DIAGNOSIS — G8929 Other chronic pain: Secondary | ICD-10-CM | POA: Diagnosis not present

## 2024-02-07 DIAGNOSIS — Z9981 Dependence on supplemental oxygen: Secondary | ICD-10-CM | POA: Diagnosis not present

## 2024-02-07 DIAGNOSIS — Z95810 Presence of automatic (implantable) cardiac defibrillator: Secondary | ICD-10-CM | POA: Diagnosis not present

## 2024-02-07 DIAGNOSIS — E785 Hyperlipidemia, unspecified: Secondary | ICD-10-CM | POA: Diagnosis not present

## 2024-02-07 DIAGNOSIS — D509 Iron deficiency anemia, unspecified: Secondary | ICD-10-CM | POA: Diagnosis not present

## 2024-02-07 DIAGNOSIS — J441 Chronic obstructive pulmonary disease with (acute) exacerbation: Secondary | ICD-10-CM | POA: Diagnosis not present

## 2024-02-07 DIAGNOSIS — I959 Hypotension, unspecified: Secondary | ICD-10-CM | POA: Diagnosis not present

## 2024-02-08 ENCOUNTER — Ambulatory Visit (HOSPITAL_COMMUNITY): Payer: Self-pay

## 2024-02-09 DIAGNOSIS — J449 Chronic obstructive pulmonary disease, unspecified: Secondary | ICD-10-CM | POA: Diagnosis not present

## 2024-02-10 DIAGNOSIS — I5020 Unspecified systolic (congestive) heart failure: Secondary | ICD-10-CM | POA: Diagnosis not present

## 2024-02-11 NOTE — Progress Notes (Incomplete)
 HEART & VASCULAR TRANSITION OF CARE CONSULT NOTE     PCP: Barbar Levine, MD  EP: Dr. Lawana Pray Primary Cardiologist: Dr. Lafayette Pierre  HPI: Referred to clinic by Alexander-Savino for heart failure consultation.   Jimmy Nelson. is a 58 y.o. male history of CAD s/p anterolateral STEMI in August 2020 s/p PCI of LAD, chronic systolic heart failure, history of V-fib arrest s/p Abbott single-chamber ICD implant in December 2020, COPD O2 dependent, CKD stage III, Crohn's disease s/p bowel resection surgeries in the past, cholelithiasis, seizure disorder, and tobacco use.  Admitted 4/25 with CP. Found to + right PE, started on [facility]. Code blue called on 01/23/24 but patient never lost pulses, more nonresponsive event due to symptomatic bradycardia. Echo showed LVEF 20-25% (stable from 03/2021), RWMA, RV normal. PVCs noted on 01/25/24. GDMT titrated and he was discharged home, weight 147 lbs.  Today he presents to [facility] for post hospital follow up with his fiance. Overall feeling fair. He is SOB with ADLs, wears 2.5 L oxygen  at night, supposed to wear at all time. Has occasional positional dizziness, fiance says he is unsteady on his feet. Denies palpitations, abnormal bleeding,edema, or PND/Orthopnea. Appetite ok. Weight at home 145 pounds. Taking all medications. Smokes 4 cigs/day, no ETOH, vapes THC. 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  Family Hx: no family hx Social Hx: lives with fiance, smokes 4 cigs/day, uses THC vape pen  Cardiac Testing  - Echo 4/25: EF 20-25%, RWMA, normal RV  - Echo 2/22: EF 20-25%  - LHC 8/20: s/p PIC LAD  - Echo 8/20: EF 25-30%, normal RV  Past Medical History:  Diagnosis Date   Acute anterolateral myocardial infarction (HCC) 05/05/2019   Acute MI, anterolateral wall, initial episode of care (HCC) 05/05/2019   S/P Prox LAD 3.5x20 and 3.0x12 Synergy 100% to 0% on 05/05/2019      Alcohol abuse 12/30/2012   Adequate for discharge     Amnesia    Anemia    Anxiety and depression    Chronic systolic heart failure (HCC) 04/14/2020   Congestive heart failure, hypertensive (HCC)    COPD (chronic obstructive pulmonary disease) (HCC)    Crohn's disease (HCC) Deteriorating disk   Crohn's disease (HCC)    Depression    Encounter for assessment of implantable cardioverter-defibrillator (ICD) 04/14/2020   Esophageal reflux 01/20/2013   GERD (gastroesophageal reflux disease)    History of lung cancer    History of MI (myocardial infarction) 05/05/2019   History of stroke    2   Hyperlipidemia    Hypertension    Hypogonadism in male    ICD - Single chamber Abbott Vascular GALLANT VR ICD 09/10/2019 09/10/2019   Kidney disease 01/21/2022   chronic   Lung cancer (HCC)    Major depressive disorder    Mental disorder    Neuropathy    Nonischemic cardiomyopathy (HCC) 08/22/2019   Oxygen  dependent    Rheumatoid arthritis of left wrist (HCC)    Seizure (HCC)    No recent seizures; continue Depakote    Sleep apnea    Stage 3 chronic kidney disease (HCC)    Stroke (HCC)    Tobacco use disorder, continuous 05/05/2019    Current Outpatient Medications  Medication Sig Dispense Refill   acetaminophen  (TYLENOL ) 500 MG tablet Take 1 tablet (500 mg total) by mouth every 6 (six) hours as needed for moderate pain (pain score 4-6)  or mild pain (pain score 1-3). 60 tablet 0   Albuterol  Sulfate (PROAIR  RESPICLICK) 108 (90 Base) MCG/ACT AEPB Inhale 2 puffs into the lungs every 6 (six) hours as needed (shortness of breath and wheezing).     APIXABAN  (ELIQUIS ) VTE STARTER PACK (10MG  AND 5MG ) Take  2 tablets (10 mg) by mouth 2 (two) times daily for 7 days, THEN 1 tablet (5 mg) 2 (two) times daily. 74 tablet 0   aspirin  EC 81 MG tablet Take 162 mg by mouth in the morning. Swallow whole. (Patient not taking: Reported on 01/31/2024)     [Paused] atorvastatin  (LIPITOR ) 80 MG tablet Take 1  tablet (80 mg total) by mouth daily. (Patient not taking: Reported on 01/31/2024) 90 tablet 2   Brexpiprazole (REXULTI) 0.5 MG TABS Take by mouth.     budeson-glycopyrrolate -formoterol  (BREZTRI  AEROSPHERE) 160-9-4.8 MCG/ACT AERO inhaler Inhale 2 puffs into the lungs in the morning and at bedtime. 10.7 g 0   Coenzyme Q10 (COQ10) 100 MG CAPS Take 100 mg by mouth in the morning.     divalproex  (DEPAKOTE ) 250 MG DR tablet Take 1 tablet (250 mg total) by mouth in the morning AND 2 tablets (500 mg total) at bedtime. 90 tablet 0   docusate sodium (COLACE) 100 MG capsule Take 200 mg by mouth in the morning.     EPINEPHrine 0.3 mg/0.3 mL IJ SOAJ injection Inject 0.3 mg into the muscle as needed for anaphylaxis.     ezetimibe  (ZETIA ) 10 MG tablet Take 1 tablet (10 mg total) by mouth daily. 30 tablet 0   famotidine  (PEPCID ) 20 MG tablet Take 1 tablet (20 mg total) by mouth at bedtime. 30 tablet 0   folic acid  (FOLVITE ) 1 MG tablet Take 1 tablet (1 mg total) by mouth daily. 30 tablet 0   gabapentin  (NEURONTIN ) 600 MG tablet Take 600 mg by mouth in the morning, at noon, and at bedtime.     ipratropium-albuterol  (DUONEB) 0.5-2.5 (3) MG/3ML SOLN Inhale 3 mLs into the lungs See admin instructions. Nebulize 3 ml's and inhale into the lungs every 4-6 hours or as needed for shortness of breath and/or wheezing     losartan  (COZAAR ) 25 MG tablet Take 0.5 tablets (12.5 mg total) by mouth daily. 45 tablet 3   magnesium  oxide (MAG-OX) 400 MG tablet Take 400 mg by mouth in the morning, at noon, and at bedtime.     metoprolol  succinate (TOPROL -XL) 25 MG 24 hr tablet Take 0.5 tablets (12.5 mg total) by mouth 2 (two) times daily. 30 tablet 0   Multiple Vitamin (MULTIVITAMIN WITH MINERALS) TABS tablet Take 1 tablet by mouth daily. 30 tablet 0   Multiple Vitamins-Minerals (CERTAVITE SENIOR/ANTIOXIDANT PO) Take by mouth.     nicotine  (NICODERM CQ  - DOSED IN MG/24 HOURS) 21 mg/24hr patch Place 21 mg onto the skin daily.      nitroGLYCERIN  (NITROSTAT ) 0.4 MG SL tablet DISSOLVE 1 TABLET UNDER THE TONGUE EVERY 5 MINUTES AS NEEDED FOR CHEST PAIN. (MAXIMUM OF 3 DOSES) 25 tablet 3   ondansetron  (ZOFRAN -ODT) 4 MG disintegrating tablet Take 1 tablet (4 mg total) by mouth every 8 (eight) hours as needed for nausea or vomiting. 10 tablet 0   pantoprazole  (PROTONIX ) 40 MG tablet Take 1 tablet (40 mg total) by mouth 2 (two) times daily. Do not take at the same time as famotidine  60 tablet 0   pyridOXINE (VITAMIN B-6) 100 MG tablet Take 100 mg by mouth in the morning.  sertraline  (ZOLOFT ) 50 MG tablet Take 3 tablets (150 mg total) by mouth daily. 90 tablet 0   thiamine  (VITAMIN B1) 100 MG tablet Take 1 tablet (100 mg total) by mouth daily. 30 tablet 0   vitamin B-12 (CYANOCOBALAMIN) 500 MCG tablet Take 500 mcg by mouth in the morning.     Zinc 50 MG TABS Take 50 mg by mouth in the morning.     No current facility-administered medications for this visit.   Allergies  Allergen Reactions   Bee Venom Anaphylaxis   Iodinated Contrast Media Anaphylaxis and Other (See Comments)    It is noted that pt is allergic to Iodinated contrast media-iv dye,oral contrast   Chantix [Varenicline Tartrate] Other (See Comments)    "Seizures"   Social History   Socioeconomic History   Marital status: Significant Other    Spouse name: Not on file   Number of children: 5   Years of education: Not on file   Highest education level: 9th grade  Occupational History   Occupation: disability  Tobacco Use   Smoking status: Every Day    Types: Cigarettes   Smokeless tobacco: Current    Types: Snuff   Tobacco comments:    3 cigarettes daily   Vaping Use   Vaping status: Never Used  Substance and Sexual Activity   Alcohol use: Yes    Comment: Occasional   Drug use: Yes    Types: Cocaine, Marijuana    Comment: Former Cocaine user   Sexual activity: Yes  Other Topics Concern   Not on file  Social History Narrative   ** Merged History  Encounter **       Social Drivers of Health   Financial Resource Strain: Low Risk  (01/25/2024)   Overall Financial Resource Strain (CARDIA)    Difficulty of Paying Living Expenses: Not very hard  Food Insecurity: No Food Insecurity (01/23/2024)   Hunger Vital Sign    Worried About Running Out of Food in the Last Year: Never true    Ran Out of Food in the Last Year: Never true  Transportation Needs: No Transportation Needs (01/25/2024)   PRAPARE - Administrator, Civil Service (Medical): No    Lack of Transportation (Non-Medical): No  Physical Activity: Not on file  Stress: Not on file  Social Connections: Not on file  Intimate Partner Violence: Not At Risk (01/23/2024)   Humiliation, Afraid, Rape, and Kick questionnaire    Fear of Current or Ex-Partner: No    Emotionally Abused: No    Physically Abused: No    Sexually Abused: No   Family History  Problem Relation Age of Onset   Diabetes Brother    Drug abuse Daughter        heroin addict   Wt Readings from Last 3 Encounters:  01/31/24 66.5 kg (146 lb 9.6 oz)  01/26/24 66.7 kg (147 lb 1.6 oz)  11/11/23 65.9 kg (145 lb 3.2 oz)   There were no vitals taken for this visit.  PHYSICAL EXAM: General:  NAD. No resp difficulty, walked into clinic, thin HEENT: Normal Neck: Supple. No JVD. Cor: Regular rate & rhythm. No rubs, gallops or murmurs. Lungs: Clear, diminished in bases Abdomen: Soft, nontender, nondistended.  Extremities: No cyanosis, clubbing, rash, edema Neuro: Alert & oriented x 3, moves all 4 extremities w/o difficulty. Affect pleasant.  ECG (personally reviewed from 01/15/24): NSR  Device interrogation (personally reviewed): CorVue stable, daily impedence elevated suggesting low volume, <1% VP, no  VT  ASSESSMENT & PLAN: Chronic Systolic Heart Failure - iCM - Echo 8/20: EF 25-30%, normal RV - PCI to LAD 8/20 - Echo 2/22: EF 20-25% - Echo 4/25: EF 20-25%, RWMA, normal RV - NYHA III, suspect COPD  main driver of symptoms. Volume looks low on exam and CorVue - With low BP, stop Entresto . - Start losartan  12.5 mg qhs - Continue Toprol  XL 12.5 mg bid - Hold off on SGLT2i and spiro  - Labs today - Not a candidate for advanced therapies with advanced COPD & cachexia  2. PE - Continue Eliquis   - CBC today  3. Tobacco Abuse/emphysema - smoking 4 cigs/day, discussed cessation. - Encourage him to wear his O2 - Continue inhalers  4. CAD - s/p PCI to LAD 2020 - No chest pain - No ASA with AC - On Zetia  + statin - Per Gen Cards  NYHA III GDMT  Diuretic: does not need BB: Toprol  XL 12.5 mg bid Ace/ARB/ARNI: start losartan  12.5 mg daily MRA: not yet SGLT2i: not yet  Referred to HFSW (PCP, Medications, Transportation, ETOH Abuse, Drug Abuse, Insurance, Surveyor, quantity ): No Refer to Pharmacy: No Refer to Home Health: No Refer to Advanced Heart Failure Clinic: No Refer to General Cardiology: No, established with Dr. Lafayette Pierre  Follow up in 3 weeks with TOC APP for further GDMT (will need BMET), then back to Gen Cards.  Jimmy Good, FNP-BC 02/11/24

## 2024-02-13 ENCOUNTER — Encounter: Payer: Self-pay | Admitting: Internal Medicine

## 2024-02-13 DIAGNOSIS — J441 Chronic obstructive pulmonary disease with (acute) exacerbation: Secondary | ICD-10-CM | POA: Diagnosis not present

## 2024-02-13 DIAGNOSIS — K219 Gastro-esophageal reflux disease without esophagitis: Secondary | ICD-10-CM | POA: Diagnosis not present

## 2024-02-13 DIAGNOSIS — Z7901 Long term (current) use of anticoagulants: Secondary | ICD-10-CM | POA: Diagnosis not present

## 2024-02-13 DIAGNOSIS — Z95810 Presence of automatic (implantable) cardiac defibrillator: Secondary | ICD-10-CM | POA: Diagnosis not present

## 2024-02-13 DIAGNOSIS — Z9981 Dependence on supplemental oxygen: Secondary | ICD-10-CM | POA: Diagnosis not present

## 2024-02-13 DIAGNOSIS — I2510 Atherosclerotic heart disease of native coronary artery without angina pectoris: Secondary | ICD-10-CM | POA: Diagnosis not present

## 2024-02-13 DIAGNOSIS — D509 Iron deficiency anemia, unspecified: Secondary | ICD-10-CM | POA: Diagnosis not present

## 2024-02-13 DIAGNOSIS — I252 Old myocardial infarction: Secondary | ICD-10-CM | POA: Diagnosis not present

## 2024-02-13 DIAGNOSIS — I959 Hypotension, unspecified: Secondary | ICD-10-CM | POA: Diagnosis not present

## 2024-02-13 DIAGNOSIS — I5020 Unspecified systolic (congestive) heart failure: Secondary | ICD-10-CM | POA: Diagnosis not present

## 2024-02-13 DIAGNOSIS — N182 Chronic kidney disease, stage 2 (mild): Secondary | ICD-10-CM | POA: Diagnosis not present

## 2024-02-13 DIAGNOSIS — Z720 Tobacco use: Secondary | ICD-10-CM | POA: Diagnosis not present

## 2024-02-13 DIAGNOSIS — I2602 Saddle embolus of pulmonary artery with acute cor pulmonale: Secondary | ICD-10-CM | POA: Diagnosis not present

## 2024-02-13 DIAGNOSIS — K5900 Constipation, unspecified: Secondary | ICD-10-CM | POA: Diagnosis not present

## 2024-02-13 DIAGNOSIS — I130 Hypertensive heart and chronic kidney disease with heart failure and stage 1 through stage 4 chronic kidney disease, or unspecified chronic kidney disease: Secondary | ICD-10-CM | POA: Diagnosis not present

## 2024-02-13 DIAGNOSIS — I69353 Hemiplegia and hemiparesis following cerebral infarction affecting right non-dominant side: Secondary | ICD-10-CM | POA: Diagnosis not present

## 2024-02-13 DIAGNOSIS — E785 Hyperlipidemia, unspecified: Secondary | ICD-10-CM | POA: Diagnosis not present

## 2024-02-13 DIAGNOSIS — G8929 Other chronic pain: Secondary | ICD-10-CM | POA: Diagnosis not present

## 2024-02-13 NOTE — Progress Notes (Signed)
 Remote ICD transmission.

## 2024-02-14 DIAGNOSIS — Z139 Encounter for screening, unspecified: Secondary | ICD-10-CM | POA: Diagnosis not present

## 2024-02-14 DIAGNOSIS — I5020 Unspecified systolic (congestive) heart failure: Secondary | ICD-10-CM | POA: Diagnosis not present

## 2024-02-14 DIAGNOSIS — I2699 Other pulmonary embolism without acute cor pulmonale: Secondary | ICD-10-CM | POA: Diagnosis not present

## 2024-02-14 DIAGNOSIS — Z1389 Encounter for screening for other disorder: Secondary | ICD-10-CM | POA: Diagnosis not present

## 2024-02-14 DIAGNOSIS — Z0000 Encounter for general adult medical examination without abnormal findings: Secondary | ICD-10-CM | POA: Diagnosis not present

## 2024-02-14 DIAGNOSIS — Z6821 Body mass index (BMI) 21.0-21.9, adult: Secondary | ICD-10-CM | POA: Diagnosis not present

## 2024-02-14 DIAGNOSIS — H6991 Unspecified Eustachian tube disorder, right ear: Secondary | ICD-10-CM | POA: Diagnosis not present

## 2024-02-14 DIAGNOSIS — Z136 Encounter for screening for cardiovascular disorders: Secondary | ICD-10-CM | POA: Diagnosis not present

## 2024-02-15 DIAGNOSIS — K5900 Constipation, unspecified: Secondary | ICD-10-CM | POA: Diagnosis not present

## 2024-02-15 DIAGNOSIS — E785 Hyperlipidemia, unspecified: Secondary | ICD-10-CM | POA: Diagnosis not present

## 2024-02-15 DIAGNOSIS — I69353 Hemiplegia and hemiparesis following cerebral infarction affecting right non-dominant side: Secondary | ICD-10-CM | POA: Diagnosis not present

## 2024-02-15 DIAGNOSIS — D509 Iron deficiency anemia, unspecified: Secondary | ICD-10-CM | POA: Diagnosis not present

## 2024-02-15 DIAGNOSIS — N182 Chronic kidney disease, stage 2 (mild): Secondary | ICD-10-CM | POA: Diagnosis not present

## 2024-02-15 DIAGNOSIS — Z7901 Long term (current) use of anticoagulants: Secondary | ICD-10-CM | POA: Diagnosis not present

## 2024-02-15 DIAGNOSIS — J441 Chronic obstructive pulmonary disease with (acute) exacerbation: Secondary | ICD-10-CM | POA: Diagnosis not present

## 2024-02-15 DIAGNOSIS — I5020 Unspecified systolic (congestive) heart failure: Secondary | ICD-10-CM | POA: Diagnosis not present

## 2024-02-15 DIAGNOSIS — Z9981 Dependence on supplemental oxygen: Secondary | ICD-10-CM | POA: Diagnosis not present

## 2024-02-15 DIAGNOSIS — I2510 Atherosclerotic heart disease of native coronary artery without angina pectoris: Secondary | ICD-10-CM | POA: Diagnosis not present

## 2024-02-15 DIAGNOSIS — I130 Hypertensive heart and chronic kidney disease with heart failure and stage 1 through stage 4 chronic kidney disease, or unspecified chronic kidney disease: Secondary | ICD-10-CM | POA: Diagnosis not present

## 2024-02-15 DIAGNOSIS — Z95810 Presence of automatic (implantable) cardiac defibrillator: Secondary | ICD-10-CM | POA: Diagnosis not present

## 2024-02-15 DIAGNOSIS — K219 Gastro-esophageal reflux disease without esophagitis: Secondary | ICD-10-CM | POA: Diagnosis not present

## 2024-02-15 DIAGNOSIS — G8929 Other chronic pain: Secondary | ICD-10-CM | POA: Diagnosis not present

## 2024-02-15 DIAGNOSIS — Z720 Tobacco use: Secondary | ICD-10-CM | POA: Diagnosis not present

## 2024-02-15 DIAGNOSIS — I959 Hypotension, unspecified: Secondary | ICD-10-CM | POA: Diagnosis not present

## 2024-02-15 DIAGNOSIS — I252 Old myocardial infarction: Secondary | ICD-10-CM | POA: Diagnosis not present

## 2024-02-15 DIAGNOSIS — I2602 Saddle embolus of pulmonary artery with acute cor pulmonale: Secondary | ICD-10-CM | POA: Diagnosis not present

## 2024-02-17 DIAGNOSIS — N182 Chronic kidney disease, stage 2 (mild): Secondary | ICD-10-CM | POA: Diagnosis not present

## 2024-02-17 DIAGNOSIS — I130 Hypertensive heart and chronic kidney disease with heart failure and stage 1 through stage 4 chronic kidney disease, or unspecified chronic kidney disease: Secondary | ICD-10-CM | POA: Diagnosis not present

## 2024-02-17 DIAGNOSIS — I69353 Hemiplegia and hemiparesis following cerebral infarction affecting right non-dominant side: Secondary | ICD-10-CM | POA: Diagnosis not present

## 2024-02-17 DIAGNOSIS — I5020 Unspecified systolic (congestive) heart failure: Secondary | ICD-10-CM | POA: Diagnosis not present

## 2024-02-17 DIAGNOSIS — D509 Iron deficiency anemia, unspecified: Secondary | ICD-10-CM | POA: Diagnosis not present

## 2024-02-17 DIAGNOSIS — I252 Old myocardial infarction: Secondary | ICD-10-CM | POA: Diagnosis not present

## 2024-02-17 DIAGNOSIS — K5900 Constipation, unspecified: Secondary | ICD-10-CM | POA: Diagnosis not present

## 2024-02-17 DIAGNOSIS — I2510 Atherosclerotic heart disease of native coronary artery without angina pectoris: Secondary | ICD-10-CM | POA: Diagnosis not present

## 2024-02-17 DIAGNOSIS — I959 Hypotension, unspecified: Secondary | ICD-10-CM | POA: Diagnosis not present

## 2024-02-17 DIAGNOSIS — G8929 Other chronic pain: Secondary | ICD-10-CM | POA: Diagnosis not present

## 2024-02-17 DIAGNOSIS — Z9981 Dependence on supplemental oxygen: Secondary | ICD-10-CM | POA: Diagnosis not present

## 2024-02-17 DIAGNOSIS — I2602 Saddle embolus of pulmonary artery with acute cor pulmonale: Secondary | ICD-10-CM | POA: Diagnosis not present

## 2024-02-17 DIAGNOSIS — Z95810 Presence of automatic (implantable) cardiac defibrillator: Secondary | ICD-10-CM | POA: Diagnosis not present

## 2024-02-17 DIAGNOSIS — K219 Gastro-esophageal reflux disease without esophagitis: Secondary | ICD-10-CM | POA: Diagnosis not present

## 2024-02-17 DIAGNOSIS — Z720 Tobacco use: Secondary | ICD-10-CM | POA: Diagnosis not present

## 2024-02-17 DIAGNOSIS — E785 Hyperlipidemia, unspecified: Secondary | ICD-10-CM | POA: Diagnosis not present

## 2024-02-17 DIAGNOSIS — J441 Chronic obstructive pulmonary disease with (acute) exacerbation: Secondary | ICD-10-CM | POA: Diagnosis not present

## 2024-02-17 DIAGNOSIS — Z7901 Long term (current) use of anticoagulants: Secondary | ICD-10-CM | POA: Diagnosis not present

## 2024-02-21 ENCOUNTER — Inpatient Hospital Stay (HOSPITAL_COMMUNITY)
Admission: RE | Admit: 2024-02-21 | Discharge: 2024-02-21 | Disposition: A | Discharge status: Home / Self Care | Source: Ambulatory Visit

## 2024-02-22 DIAGNOSIS — Z7901 Long term (current) use of anticoagulants: Secondary | ICD-10-CM | POA: Diagnosis not present

## 2024-02-22 DIAGNOSIS — I2602 Saddle embolus of pulmonary artery with acute cor pulmonale: Secondary | ICD-10-CM | POA: Diagnosis not present

## 2024-02-22 DIAGNOSIS — N182 Chronic kidney disease, stage 2 (mild): Secondary | ICD-10-CM | POA: Diagnosis not present

## 2024-02-22 DIAGNOSIS — D509 Iron deficiency anemia, unspecified: Secondary | ICD-10-CM | POA: Diagnosis not present

## 2024-02-22 DIAGNOSIS — I69353 Hemiplegia and hemiparesis following cerebral infarction affecting right non-dominant side: Secondary | ICD-10-CM | POA: Diagnosis not present

## 2024-02-22 DIAGNOSIS — E785 Hyperlipidemia, unspecified: Secondary | ICD-10-CM | POA: Diagnosis not present

## 2024-02-22 DIAGNOSIS — Z720 Tobacco use: Secondary | ICD-10-CM | POA: Diagnosis not present

## 2024-02-22 DIAGNOSIS — Z95810 Presence of automatic (implantable) cardiac defibrillator: Secondary | ICD-10-CM | POA: Diagnosis not present

## 2024-02-22 DIAGNOSIS — I5020 Unspecified systolic (congestive) heart failure: Secondary | ICD-10-CM | POA: Diagnosis not present

## 2024-02-22 DIAGNOSIS — Z9981 Dependence on supplemental oxygen: Secondary | ICD-10-CM | POA: Diagnosis not present

## 2024-02-22 DIAGNOSIS — I2510 Atherosclerotic heart disease of native coronary artery without angina pectoris: Secondary | ICD-10-CM | POA: Diagnosis not present

## 2024-02-22 DIAGNOSIS — K219 Gastro-esophageal reflux disease without esophagitis: Secondary | ICD-10-CM | POA: Diagnosis not present

## 2024-02-22 DIAGNOSIS — I130 Hypertensive heart and chronic kidney disease with heart failure and stage 1 through stage 4 chronic kidney disease, or unspecified chronic kidney disease: Secondary | ICD-10-CM | POA: Diagnosis not present

## 2024-02-22 DIAGNOSIS — J441 Chronic obstructive pulmonary disease with (acute) exacerbation: Secondary | ICD-10-CM | POA: Diagnosis not present

## 2024-02-22 DIAGNOSIS — I252 Old myocardial infarction: Secondary | ICD-10-CM | POA: Diagnosis not present

## 2024-02-22 DIAGNOSIS — I959 Hypotension, unspecified: Secondary | ICD-10-CM | POA: Diagnosis not present

## 2024-02-22 DIAGNOSIS — G8929 Other chronic pain: Secondary | ICD-10-CM | POA: Diagnosis not present

## 2024-02-22 DIAGNOSIS — K5900 Constipation, unspecified: Secondary | ICD-10-CM | POA: Diagnosis not present

## 2024-02-24 NOTE — Telephone Encounter (Signed)
 Opened in error

## 2024-02-26 DIAGNOSIS — J449 Chronic obstructive pulmonary disease, unspecified: Secondary | ICD-10-CM | POA: Diagnosis not present

## 2024-02-26 DIAGNOSIS — I110 Hypertensive heart disease with heart failure: Secondary | ICD-10-CM | POA: Diagnosis not present

## 2024-02-29 DIAGNOSIS — J441 Chronic obstructive pulmonary disease with (acute) exacerbation: Secondary | ICD-10-CM | POA: Diagnosis not present

## 2024-02-29 DIAGNOSIS — N182 Chronic kidney disease, stage 2 (mild): Secondary | ICD-10-CM | POA: Diagnosis not present

## 2024-02-29 DIAGNOSIS — I130 Hypertensive heart and chronic kidney disease with heart failure and stage 1 through stage 4 chronic kidney disease, or unspecified chronic kidney disease: Secondary | ICD-10-CM | POA: Diagnosis not present

## 2024-02-29 DIAGNOSIS — K5900 Constipation, unspecified: Secondary | ICD-10-CM | POA: Diagnosis not present

## 2024-02-29 DIAGNOSIS — I2510 Atherosclerotic heart disease of native coronary artery without angina pectoris: Secondary | ICD-10-CM | POA: Diagnosis not present

## 2024-02-29 DIAGNOSIS — I959 Hypotension, unspecified: Secondary | ICD-10-CM | POA: Diagnosis not present

## 2024-02-29 DIAGNOSIS — I5020 Unspecified systolic (congestive) heart failure: Secondary | ICD-10-CM | POA: Diagnosis not present

## 2024-02-29 DIAGNOSIS — I69353 Hemiplegia and hemiparesis following cerebral infarction affecting right non-dominant side: Secondary | ICD-10-CM | POA: Diagnosis not present

## 2024-02-29 DIAGNOSIS — D509 Iron deficiency anemia, unspecified: Secondary | ICD-10-CM | POA: Diagnosis not present

## 2024-02-29 DIAGNOSIS — I2602 Saddle embolus of pulmonary artery with acute cor pulmonale: Secondary | ICD-10-CM | POA: Diagnosis not present

## 2024-02-29 DIAGNOSIS — Z7901 Long term (current) use of anticoagulants: Secondary | ICD-10-CM | POA: Diagnosis not present

## 2024-02-29 DIAGNOSIS — K219 Gastro-esophageal reflux disease without esophagitis: Secondary | ICD-10-CM | POA: Diagnosis not present

## 2024-02-29 DIAGNOSIS — I252 Old myocardial infarction: Secondary | ICD-10-CM | POA: Diagnosis not present

## 2024-02-29 DIAGNOSIS — Z720 Tobacco use: Secondary | ICD-10-CM | POA: Diagnosis not present

## 2024-02-29 DIAGNOSIS — Z95810 Presence of automatic (implantable) cardiac defibrillator: Secondary | ICD-10-CM | POA: Diagnosis not present

## 2024-02-29 DIAGNOSIS — E785 Hyperlipidemia, unspecified: Secondary | ICD-10-CM | POA: Diagnosis not present

## 2024-02-29 DIAGNOSIS — Z9981 Dependence on supplemental oxygen: Secondary | ICD-10-CM | POA: Diagnosis not present

## 2024-02-29 DIAGNOSIS — G8929 Other chronic pain: Secondary | ICD-10-CM | POA: Diagnosis not present

## 2024-03-02 DIAGNOSIS — N1831 Chronic kidney disease, stage 3a: Secondary | ICD-10-CM | POA: Diagnosis not present

## 2024-03-05 DIAGNOSIS — K219 Gastro-esophageal reflux disease without esophagitis: Secondary | ICD-10-CM | POA: Diagnosis not present

## 2024-03-05 DIAGNOSIS — I959 Hypotension, unspecified: Secondary | ICD-10-CM | POA: Diagnosis not present

## 2024-03-05 DIAGNOSIS — I2510 Atherosclerotic heart disease of native coronary artery without angina pectoris: Secondary | ICD-10-CM | POA: Diagnosis not present

## 2024-03-05 DIAGNOSIS — I130 Hypertensive heart and chronic kidney disease with heart failure and stage 1 through stage 4 chronic kidney disease, or unspecified chronic kidney disease: Secondary | ICD-10-CM | POA: Diagnosis not present

## 2024-03-05 DIAGNOSIS — I252 Old myocardial infarction: Secondary | ICD-10-CM | POA: Diagnosis not present

## 2024-03-05 DIAGNOSIS — G8929 Other chronic pain: Secondary | ICD-10-CM | POA: Diagnosis not present

## 2024-03-05 DIAGNOSIS — R29898 Other symptoms and signs involving the musculoskeletal system: Secondary | ICD-10-CM | POA: Diagnosis not present

## 2024-03-05 DIAGNOSIS — E785 Hyperlipidemia, unspecified: Secondary | ICD-10-CM | POA: Diagnosis not present

## 2024-03-05 DIAGNOSIS — I69353 Hemiplegia and hemiparesis following cerebral infarction affecting right non-dominant side: Secondary | ICD-10-CM | POA: Diagnosis not present

## 2024-03-05 DIAGNOSIS — Z7901 Long term (current) use of anticoagulants: Secondary | ICD-10-CM | POA: Diagnosis not present

## 2024-03-05 DIAGNOSIS — I110 Hypertensive heart disease with heart failure: Secondary | ICD-10-CM | POA: Diagnosis not present

## 2024-03-05 DIAGNOSIS — I5020 Unspecified systolic (congestive) heart failure: Secondary | ICD-10-CM | POA: Diagnosis not present

## 2024-03-05 DIAGNOSIS — D509 Iron deficiency anemia, unspecified: Secondary | ICD-10-CM | POA: Diagnosis not present

## 2024-03-05 DIAGNOSIS — I2602 Saddle embolus of pulmonary artery with acute cor pulmonale: Secondary | ICD-10-CM | POA: Diagnosis not present

## 2024-03-05 DIAGNOSIS — N182 Chronic kidney disease, stage 2 (mild): Secondary | ICD-10-CM | POA: Diagnosis not present

## 2024-03-05 DIAGNOSIS — Z720 Tobacco use: Secondary | ICD-10-CM | POA: Diagnosis not present

## 2024-03-05 DIAGNOSIS — Z95810 Presence of automatic (implantable) cardiac defibrillator: Secondary | ICD-10-CM | POA: Diagnosis not present

## 2024-03-05 DIAGNOSIS — J441 Chronic obstructive pulmonary disease with (acute) exacerbation: Secondary | ICD-10-CM | POA: Diagnosis not present

## 2024-03-05 DIAGNOSIS — Z9981 Dependence on supplemental oxygen: Secondary | ICD-10-CM | POA: Diagnosis not present

## 2024-03-05 DIAGNOSIS — K5900 Constipation, unspecified: Secondary | ICD-10-CM | POA: Diagnosis not present

## 2024-03-07 DIAGNOSIS — D509 Iron deficiency anemia, unspecified: Secondary | ICD-10-CM | POA: Diagnosis not present

## 2024-03-07 DIAGNOSIS — I5020 Unspecified systolic (congestive) heart failure: Secondary | ICD-10-CM | POA: Diagnosis not present

## 2024-03-07 DIAGNOSIS — Z720 Tobacco use: Secondary | ICD-10-CM | POA: Diagnosis not present

## 2024-03-07 DIAGNOSIS — N182 Chronic kidney disease, stage 2 (mild): Secondary | ICD-10-CM | POA: Diagnosis not present

## 2024-03-07 DIAGNOSIS — K5900 Constipation, unspecified: Secondary | ICD-10-CM | POA: Diagnosis not present

## 2024-03-07 DIAGNOSIS — G8929 Other chronic pain: Secondary | ICD-10-CM | POA: Diagnosis not present

## 2024-03-07 DIAGNOSIS — Z95810 Presence of automatic (implantable) cardiac defibrillator: Secondary | ICD-10-CM | POA: Diagnosis not present

## 2024-03-07 DIAGNOSIS — I2602 Saddle embolus of pulmonary artery with acute cor pulmonale: Secondary | ICD-10-CM | POA: Diagnosis not present

## 2024-03-07 DIAGNOSIS — K219 Gastro-esophageal reflux disease without esophagitis: Secondary | ICD-10-CM | POA: Diagnosis not present

## 2024-03-07 DIAGNOSIS — Z7901 Long term (current) use of anticoagulants: Secondary | ICD-10-CM | POA: Diagnosis not present

## 2024-03-07 DIAGNOSIS — I130 Hypertensive heart and chronic kidney disease with heart failure and stage 1 through stage 4 chronic kidney disease, or unspecified chronic kidney disease: Secondary | ICD-10-CM | POA: Diagnosis not present

## 2024-03-07 DIAGNOSIS — Z9981 Dependence on supplemental oxygen: Secondary | ICD-10-CM | POA: Diagnosis not present

## 2024-03-07 DIAGNOSIS — I252 Old myocardial infarction: Secondary | ICD-10-CM | POA: Diagnosis not present

## 2024-03-07 DIAGNOSIS — I69353 Hemiplegia and hemiparesis following cerebral infarction affecting right non-dominant side: Secondary | ICD-10-CM | POA: Diagnosis not present

## 2024-03-07 DIAGNOSIS — I2510 Atherosclerotic heart disease of native coronary artery without angina pectoris: Secondary | ICD-10-CM | POA: Diagnosis not present

## 2024-03-07 DIAGNOSIS — E785 Hyperlipidemia, unspecified: Secondary | ICD-10-CM | POA: Diagnosis not present

## 2024-03-07 DIAGNOSIS — J441 Chronic obstructive pulmonary disease with (acute) exacerbation: Secondary | ICD-10-CM | POA: Diagnosis not present

## 2024-03-07 DIAGNOSIS — I959 Hypotension, unspecified: Secondary | ICD-10-CM | POA: Diagnosis not present

## 2024-03-11 DIAGNOSIS — J449 Chronic obstructive pulmonary disease, unspecified: Secondary | ICD-10-CM | POA: Diagnosis not present

## 2024-03-12 ENCOUNTER — Telehealth (HOSPITAL_COMMUNITY): Payer: Self-pay | Admitting: Cardiology

## 2024-03-12 DIAGNOSIS — Z6822 Body mass index (BMI) 22.0-22.9, adult: Secondary | ICD-10-CM | POA: Diagnosis not present

## 2024-03-12 DIAGNOSIS — H6091 Unspecified otitis externa, right ear: Secondary | ICD-10-CM | POA: Diagnosis not present

## 2024-03-12 DIAGNOSIS — I5020 Unspecified systolic (congestive) heart failure: Secondary | ICD-10-CM | POA: Diagnosis not present

## 2024-03-12 NOTE — Telephone Encounter (Signed)
 Pt returned call about letter received for labs  Pt aware 5/4 lab results Nothing further needed at this time

## 2024-03-14 DIAGNOSIS — Z9981 Dependence on supplemental oxygen: Secondary | ICD-10-CM | POA: Diagnosis not present

## 2024-03-14 DIAGNOSIS — I5020 Unspecified systolic (congestive) heart failure: Secondary | ICD-10-CM | POA: Diagnosis not present

## 2024-03-14 DIAGNOSIS — I130 Hypertensive heart and chronic kidney disease with heart failure and stage 1 through stage 4 chronic kidney disease, or unspecified chronic kidney disease: Secondary | ICD-10-CM | POA: Diagnosis not present

## 2024-03-14 DIAGNOSIS — Z95810 Presence of automatic (implantable) cardiac defibrillator: Secondary | ICD-10-CM | POA: Diagnosis not present

## 2024-03-14 DIAGNOSIS — I252 Old myocardial infarction: Secondary | ICD-10-CM | POA: Diagnosis not present

## 2024-03-14 DIAGNOSIS — K5900 Constipation, unspecified: Secondary | ICD-10-CM | POA: Diagnosis not present

## 2024-03-14 DIAGNOSIS — I2602 Saddle embolus of pulmonary artery with acute cor pulmonale: Secondary | ICD-10-CM | POA: Diagnosis not present

## 2024-03-14 DIAGNOSIS — J441 Chronic obstructive pulmonary disease with (acute) exacerbation: Secondary | ICD-10-CM | POA: Diagnosis not present

## 2024-03-14 DIAGNOSIS — N182 Chronic kidney disease, stage 2 (mild): Secondary | ICD-10-CM | POA: Diagnosis not present

## 2024-03-14 DIAGNOSIS — G8929 Other chronic pain: Secondary | ICD-10-CM | POA: Diagnosis not present

## 2024-03-14 DIAGNOSIS — D509 Iron deficiency anemia, unspecified: Secondary | ICD-10-CM | POA: Diagnosis not present

## 2024-03-14 DIAGNOSIS — Z7901 Long term (current) use of anticoagulants: Secondary | ICD-10-CM | POA: Diagnosis not present

## 2024-03-14 DIAGNOSIS — E785 Hyperlipidemia, unspecified: Secondary | ICD-10-CM | POA: Diagnosis not present

## 2024-03-14 DIAGNOSIS — I959 Hypotension, unspecified: Secondary | ICD-10-CM | POA: Diagnosis not present

## 2024-03-14 DIAGNOSIS — K219 Gastro-esophageal reflux disease without esophagitis: Secondary | ICD-10-CM | POA: Diagnosis not present

## 2024-03-14 DIAGNOSIS — I2510 Atherosclerotic heart disease of native coronary artery without angina pectoris: Secondary | ICD-10-CM | POA: Diagnosis not present

## 2024-03-14 DIAGNOSIS — I69353 Hemiplegia and hemiparesis following cerebral infarction affecting right non-dominant side: Secondary | ICD-10-CM | POA: Diagnosis not present

## 2024-03-14 DIAGNOSIS — Z720 Tobacco use: Secondary | ICD-10-CM | POA: Diagnosis not present

## 2024-03-21 DIAGNOSIS — E785 Hyperlipidemia, unspecified: Secondary | ICD-10-CM | POA: Diagnosis not present

## 2024-03-21 DIAGNOSIS — K219 Gastro-esophageal reflux disease without esophagitis: Secondary | ICD-10-CM | POA: Diagnosis not present

## 2024-03-21 DIAGNOSIS — I130 Hypertensive heart and chronic kidney disease with heart failure and stage 1 through stage 4 chronic kidney disease, or unspecified chronic kidney disease: Secondary | ICD-10-CM | POA: Diagnosis not present

## 2024-03-21 DIAGNOSIS — Z9981 Dependence on supplemental oxygen: Secondary | ICD-10-CM | POA: Diagnosis not present

## 2024-03-21 DIAGNOSIS — D509 Iron deficiency anemia, unspecified: Secondary | ICD-10-CM | POA: Diagnosis not present

## 2024-03-21 DIAGNOSIS — I2510 Atherosclerotic heart disease of native coronary artery without angina pectoris: Secondary | ICD-10-CM | POA: Diagnosis not present

## 2024-03-21 DIAGNOSIS — K5900 Constipation, unspecified: Secondary | ICD-10-CM | POA: Diagnosis not present

## 2024-03-21 DIAGNOSIS — I959 Hypotension, unspecified: Secondary | ICD-10-CM | POA: Diagnosis not present

## 2024-03-21 DIAGNOSIS — G8929 Other chronic pain: Secondary | ICD-10-CM | POA: Diagnosis not present

## 2024-03-21 DIAGNOSIS — Z7901 Long term (current) use of anticoagulants: Secondary | ICD-10-CM | POA: Diagnosis not present

## 2024-03-21 DIAGNOSIS — I5020 Unspecified systolic (congestive) heart failure: Secondary | ICD-10-CM | POA: Diagnosis not present

## 2024-03-21 DIAGNOSIS — Z95810 Presence of automatic (implantable) cardiac defibrillator: Secondary | ICD-10-CM | POA: Diagnosis not present

## 2024-03-21 DIAGNOSIS — J441 Chronic obstructive pulmonary disease with (acute) exacerbation: Secondary | ICD-10-CM | POA: Diagnosis not present

## 2024-03-21 DIAGNOSIS — N182 Chronic kidney disease, stage 2 (mild): Secondary | ICD-10-CM | POA: Diagnosis not present

## 2024-03-21 DIAGNOSIS — Z720 Tobacco use: Secondary | ICD-10-CM | POA: Diagnosis not present

## 2024-03-21 DIAGNOSIS — I69353 Hemiplegia and hemiparesis following cerebral infarction affecting right non-dominant side: Secondary | ICD-10-CM | POA: Diagnosis not present

## 2024-03-21 DIAGNOSIS — I2602 Saddle embolus of pulmonary artery with acute cor pulmonale: Secondary | ICD-10-CM | POA: Diagnosis not present

## 2024-03-21 DIAGNOSIS — I252 Old myocardial infarction: Secondary | ICD-10-CM | POA: Diagnosis not present

## 2024-03-26 DIAGNOSIS — Z7901 Long term (current) use of anticoagulants: Secondary | ICD-10-CM | POA: Diagnosis not present

## 2024-03-26 DIAGNOSIS — I69353 Hemiplegia and hemiparesis following cerebral infarction affecting right non-dominant side: Secondary | ICD-10-CM | POA: Diagnosis not present

## 2024-03-26 DIAGNOSIS — E785 Hyperlipidemia, unspecified: Secondary | ICD-10-CM | POA: Diagnosis not present

## 2024-03-26 DIAGNOSIS — K5900 Constipation, unspecified: Secondary | ICD-10-CM | POA: Diagnosis not present

## 2024-03-26 DIAGNOSIS — I252 Old myocardial infarction: Secondary | ICD-10-CM | POA: Diagnosis not present

## 2024-03-26 DIAGNOSIS — I959 Hypotension, unspecified: Secondary | ICD-10-CM | POA: Diagnosis not present

## 2024-03-26 DIAGNOSIS — K219 Gastro-esophageal reflux disease without esophagitis: Secondary | ICD-10-CM | POA: Diagnosis not present

## 2024-03-26 DIAGNOSIS — J441 Chronic obstructive pulmonary disease with (acute) exacerbation: Secondary | ICD-10-CM | POA: Diagnosis not present

## 2024-03-26 DIAGNOSIS — I2510 Atherosclerotic heart disease of native coronary artery without angina pectoris: Secondary | ICD-10-CM | POA: Diagnosis not present

## 2024-03-26 DIAGNOSIS — G8929 Other chronic pain: Secondary | ICD-10-CM | POA: Diagnosis not present

## 2024-03-26 DIAGNOSIS — D509 Iron deficiency anemia, unspecified: Secondary | ICD-10-CM | POA: Diagnosis not present

## 2024-03-26 DIAGNOSIS — I5020 Unspecified systolic (congestive) heart failure: Secondary | ICD-10-CM | POA: Diagnosis not present

## 2024-03-26 DIAGNOSIS — I130 Hypertensive heart and chronic kidney disease with heart failure and stage 1 through stage 4 chronic kidney disease, or unspecified chronic kidney disease: Secondary | ICD-10-CM | POA: Diagnosis not present

## 2024-03-26 DIAGNOSIS — I2602 Saddle embolus of pulmonary artery with acute cor pulmonale: Secondary | ICD-10-CM | POA: Diagnosis not present

## 2024-03-26 DIAGNOSIS — N182 Chronic kidney disease, stage 2 (mild): Secondary | ICD-10-CM | POA: Diagnosis not present

## 2024-03-26 DIAGNOSIS — Z9981 Dependence on supplemental oxygen: Secondary | ICD-10-CM | POA: Diagnosis not present

## 2024-03-26 DIAGNOSIS — Z720 Tobacco use: Secondary | ICD-10-CM | POA: Diagnosis not present

## 2024-03-26 DIAGNOSIS — Z95810 Presence of automatic (implantable) cardiac defibrillator: Secondary | ICD-10-CM | POA: Diagnosis not present

## 2024-03-27 DIAGNOSIS — I110 Hypertensive heart disease with heart failure: Secondary | ICD-10-CM | POA: Diagnosis not present

## 2024-03-27 DIAGNOSIS — J449 Chronic obstructive pulmonary disease, unspecified: Secondary | ICD-10-CM | POA: Diagnosis not present

## 2024-04-04 DIAGNOSIS — I5020 Unspecified systolic (congestive) heart failure: Secondary | ICD-10-CM | POA: Diagnosis not present

## 2024-04-04 DIAGNOSIS — R29898 Other symptoms and signs involving the musculoskeletal system: Secondary | ICD-10-CM | POA: Diagnosis not present

## 2024-04-05 ENCOUNTER — Ambulatory Visit: Payer: 59

## 2024-04-05 DIAGNOSIS — I428 Other cardiomyopathies: Secondary | ICD-10-CM

## 2024-04-05 DIAGNOSIS — G4730 Sleep apnea, unspecified: Secondary | ICD-10-CM | POA: Diagnosis not present

## 2024-04-06 LAB — CUP PACEART REMOTE DEVICE CHECK
Battery Remaining Longevity: 80 mo
Battery Remaining Percentage: 66 %
Battery Voltage: 2.98 V
Brady Statistic RV Percent Paced: 1 %
Date Time Interrogation Session: 20250711105012
HighPow Impedance: 63 Ohm
Implantable Lead Connection Status: 753985
Implantable Lead Implant Date: 20201214
Implantable Lead Location: 753860
Implantable Pulse Generator Implant Date: 20201214
Lead Channel Impedance Value: 640 Ohm
Lead Channel Pacing Threshold Amplitude: 0.75 V
Lead Channel Pacing Threshold Pulse Width: 0.5 ms
Lead Channel Sensing Intrinsic Amplitude: 12 mV
Lead Channel Setting Pacing Amplitude: 2.5 V
Lead Channel Setting Pacing Pulse Width: 0.5 ms
Lead Channel Setting Sensing Sensitivity: 0.5 mV
Pulse Gen Serial Number: 111013076
Zone Setting Status: 755011

## 2024-04-09 ENCOUNTER — Telehealth (HOSPITAL_COMMUNITY): Payer: Self-pay | Admitting: Cardiology

## 2024-04-09 NOTE — Telephone Encounter (Signed)
 Patient called to request letter for probation officer. Reports letter must state he was hospitalized 4/28-5/1 and was told to stay bed ridden due to lung clots.

## 2024-04-09 NOTE — Telephone Encounter (Signed)
Letter written and sent via my chart as requested.

## 2024-04-10 DIAGNOSIS — J449 Chronic obstructive pulmonary disease, unspecified: Secondary | ICD-10-CM | POA: Diagnosis not present

## 2024-04-11 ENCOUNTER — Ambulatory Visit: Payer: Self-pay | Admitting: Cardiology

## 2024-04-13 ENCOUNTER — Telehealth (HOSPITAL_COMMUNITY): Payer: Self-pay

## 2024-04-13 NOTE — Telephone Encounter (Signed)
 Called to confirm/remind patient of their appointment at the [facility] on 04/16/2024. 11:15.   Appointment:   [] Confirmed  [x] Left mess   [] No answer/No voice mail  [] VM Full/unable to leave message  [] Phone not in service  Patient reminded to bring all medications and/or complete list.  Confirmed patient has transportation. Gave directions, instructed to utilize valet parking.  `

## 2024-04-14 ENCOUNTER — Other Ambulatory Visit: Payer: Self-pay | Admitting: Cardiology

## 2024-04-16 ENCOUNTER — Inpatient Hospital Stay (HOSPITAL_COMMUNITY)
Admission: RE | Admit: 2024-04-16 | Discharge: 2024-04-16 | Disposition: A | Discharge status: Home / Self Care | Source: Ambulatory Visit

## 2024-04-16 NOTE — Progress Notes (Incomplete)
 HEART IMPACT TRANSITIONS OF CARE    PCP: Dr Trinidad  Primary Cardiologist: Dr Edwyna   Chief Complaint: Heart Failure   HPI: Jimmy LOHSE Sr. is a 58 y.o. male history of CAD s/p anterolateral STEMI in August 2020 s/p PCI of LAD, chronic systolic heart failure, history of V-fib arrest s/p Abbott single-chamber ICD implant in December 2020, COPD O2 dependent, CKD stage III, Crohn's disease s/p bowel resection surgeries in the past, cholelithiasis, seizure disorder, and tobacco use.   Admitted 4/25 with CP. Found to + right PE, started on [facility]. Code blue called on 01/23/24 but patient never lost pulses, more nonresponsive event due to symptomatic bradycardia. Echo showed LVEF 20-25% (stable from 03/2021), RWMA, RV normal. PVCs noted on 01/25/24. GDMT titrated and he was discharged home, weight 147 lbs.  Today he returns for HF follow up.Overall feeling fine. Denies SOB/PND/Orthopnea. Appetite ok. No fever or chills. Weight at home  pounds. Taking all medications     ROS: All systems negative except as listed in HPI, PMH and Problem List.  SH:  Social History   Socioeconomic History   Marital status: Significant Other    Spouse name: Not on file   Number of children: 5   Years of education: Not on file   Highest education level: 9th grade  Occupational History   Occupation: disability  Tobacco Use   Smoking status: Every Day    Types: Cigarettes   Smokeless tobacco: Current    Types: Snuff   Tobacco comments:    3 cigarettes daily   Vaping Use   Vaping status: Never Used  Substance and Sexual Activity   Alcohol use: Yes    Comment: Occasional   Drug use: Yes    Types: Cocaine, Marijuana    Comment: Former Cocaine user   Sexual activity: Yes  Other Topics Concern   Not on file  Social History Narrative   ** Merged History Encounter **       Social Drivers of Health   Financial Resource Strain: Low Risk  (01/25/2024)   Overall Financial Resource Strain (CARDIA)     Difficulty of Paying Living Expenses: Not very hard  Food Insecurity: No Food Insecurity (01/23/2024)   Hunger Vital Sign    Worried About Running Out of Food in the Last Year: Never true    Ran Out of Food in the Last Year: Never true  Transportation Needs: No Transportation Needs (01/25/2024)   PRAPARE - Administrator, Civil Service (Medical): No    Lack of Transportation (Non-Medical): No  Physical Activity: Not on file  Stress: Not on file  Social Connections: Not on file  Intimate Partner Violence: Not At Risk (01/23/2024)   Humiliation, Afraid, Rape, and Kick questionnaire    Fear of Current or Ex-Partner: No    Emotionally Abused: No    Physically Abused: No    Sexually Abused: No    FH:  Family History  Problem Relation Age of Onset   Diabetes Brother    Drug abuse Daughter        heroin addict    Past Medical History:  Diagnosis Date   Acute anterolateral myocardial infarction (HCC) 05/05/2019   Acute MI, anterolateral wall, initial episode of care (HCC) 05/05/2019   S/P Prox LAD 3.5x20 and 3.0x12 Synergy 100% to 0% on 05/05/2019     Alcohol abuse 12/30/2012   Adequate for discharge     Amnesia    Anemia  Anxiety and depression    Chronic systolic heart failure (HCC) 04/14/2020   Congestive heart failure, hypertensive (HCC)    COPD (chronic obstructive pulmonary disease) (HCC)    Crohn's disease (HCC) Deteriorating disk   Crohn's disease (HCC)    Depression    Encounter for assessment of implantable cardioverter-defibrillator (ICD) 04/14/2020   Esophageal reflux 01/20/2013   GERD (gastroesophageal reflux disease)    History of lung cancer    History of MI (myocardial infarction) 05/05/2019   History of stroke    2   Hyperlipidemia    Hypertension    Hypogonadism in male    ICD - Single chamber Abbott Vascular GALLANT VR ICD 09/10/2019 09/10/2019   Kidney disease 01/21/2022   chronic   Lung cancer (HCC)    Major depressive disorder     Mental disorder    Neuropathy    Nonischemic cardiomyopathy (HCC) 08/22/2019   Oxygen  dependent    Rheumatoid arthritis of left wrist (HCC)    Seizure (HCC)    No recent seizures; continue Depakote    Sleep apnea    Stage 3 chronic kidney disease (HCC)    Stroke (HCC)    Tobacco use disorder, continuous 05/05/2019    Current Outpatient Medications  Medication Sig Dispense Refill   acetaminophen  (TYLENOL ) 500 MG tablet Take 1 tablet (500 mg total) by mouth every 6 (six) hours as needed for moderate pain (pain score 4-6) or mild pain (pain score 1-3). 60 tablet 0   Albuterol  Sulfate (PROAIR  RESPICLICK) 108 (90 Base) MCG/ACT AEPB Inhale 2 puffs into the lungs every 6 (six) hours as needed (shortness of breath and wheezing).     APIXABAN  (ELIQUIS ) VTE STARTER PACK (10MG  AND 5MG ) Take  2 tablets (10 mg) by mouth 2 (two) times daily for 7 days, THEN 1 tablet (5 mg) 2 (two) times daily. 74 tablet 0   aspirin  EC 81 MG tablet Take 162 mg by mouth in the morning. Swallow whole. (Patient not taking: Reported on 01/31/2024)     [Paused] atorvastatin  (LIPITOR ) 80 MG tablet Take 1 tablet (80 mg total) by mouth daily. (Patient not taking: Reported on 01/31/2024) 90 tablet 2   Brexpiprazole (REXULTI) 0.5 MG TABS Take by mouth.     budeson-glycopyrrolate -formoterol  (BREZTRI  AEROSPHERE) 160-9-4.8 MCG/ACT AERO inhaler Inhale 2 puffs into the lungs in the morning and at bedtime. 10.7 g 0   Coenzyme Q10 (COQ10) 100 MG CAPS Take 100 mg by mouth in the morning.     divalproex  (DEPAKOTE ) 250 MG DR tablet Take 1 tablet (250 mg total) by mouth in the morning AND 2 tablets (500 mg total) at bedtime. 90 tablet 0   docusate sodium (COLACE) 100 MG capsule Take 200 mg by mouth in the morning.     EPINEPHrine 0.3 mg/0.3 mL IJ SOAJ injection Inject 0.3 mg into the muscle as needed for anaphylaxis.     ezetimibe  (ZETIA ) 10 MG tablet Take 1 tablet (10 mg total) by mouth daily. 30 tablet 0   famotidine  (PEPCID ) 20 MG tablet Take  1 tablet (20 mg total) by mouth at bedtime. 30 tablet 0   folic acid  (FOLVITE ) 1 MG tablet Take 1 tablet (1 mg total) by mouth daily. 30 tablet 0   gabapentin  (NEURONTIN ) 600 MG tablet Take 600 mg by mouth in the morning, at noon, and at bedtime.     ipratropium-albuterol  (DUONEB) 0.5-2.5 (3) MG/3ML SOLN Inhale 3 mLs into the lungs See admin instructions. Nebulize 3 ml's and inhale into  the lungs every 4-6 hours or as needed for shortness of breath and/or wheezing     losartan  (COZAAR ) 25 MG tablet Take 0.5 tablets (12.5 mg total) by mouth daily. 45 tablet 3   magnesium  oxide (MAG-OX) 400 MG tablet Take 400 mg by mouth in the morning, at noon, and at bedtime.     metoprolol  succinate (TOPROL -XL) 25 MG 24 hr tablet Take 0.5 tablets (12.5 mg total) by mouth 2 (two) times daily. 30 tablet 0   Multiple Vitamin (MULTIVITAMIN WITH MINERALS) TABS tablet Take 1 tablet by mouth daily. 30 tablet 0   Multiple Vitamins-Minerals (CERTAVITE SENIOR/ANTIOXIDANT PO) Take by mouth.     nicotine  (NICODERM CQ  - DOSED IN MG/24 HOURS) 21 mg/24hr patch Place 21 mg onto the skin daily.     nitroGLYCERIN  (NITROSTAT ) 0.4 MG SL tablet DISSOLVE 1 TABLET UNDER THE TONGUE EVERY 5 MINUTES AS NEEDED FOR CHEST PAIN. (MAXIMUM OF 3 DOSES) 25 tablet 3   ondansetron  (ZOFRAN -ODT) 4 MG disintegrating tablet Take 1 tablet (4 mg total) by mouth every 8 (eight) hours as needed for nausea or vomiting. 10 tablet 0   pantoprazole  (PROTONIX ) 40 MG tablet Take 1 tablet (40 mg total) by mouth 2 (two) times daily. Do not take at the same time as famotidine  60 tablet 0   pyridOXINE (VITAMIN B-6) 100 MG tablet Take 100 mg by mouth in the morning.     sertraline  (ZOLOFT ) 50 MG tablet Take 3 tablets (150 mg total) by mouth daily. 90 tablet 0   thiamine  (VITAMIN B1) 100 MG tablet Take 1 tablet (100 mg total) by mouth daily. 30 tablet 0   vitamin B-12 (CYANOCOBALAMIN) 500 MCG tablet Take 500 mcg by mouth in the morning.     Zinc 50 MG TABS Take 50 mg by  mouth in the morning.     No current facility-administered medications for this visit.    There were no vitals filed for this visit.  PHYSICAL EXAM: General:   No resp difficulty Neck: no JVD.  Cor: Regular rate & rhythm. Lungs: clear Abdomen: soft, nontender, nondistended.  Extremities: no  edema Neuro: alert & oriented x3   ASSESSMENT & PLAN: Chronic Systolic Heart Failure - iCM - Echo 8/20: EF 25-30%, normal RV - PCI to LAD 8/20 - Echo 2/22: EF 20-25% - Echo 4/25: EF 20-25%, RWMA, normal RV - NYHA suspect COPD main driver of symptoms. Volume looks low on exam and CorVue - With low BP, stop Entresto . - Continue  losartan  12.5 mg qhs - Continue Toprol  XL 12.5 mg bid - Hold off on SGLT2i and spiro  - Not a candidate for advanced therapies with advanced COPD & cachexia   2. PE - Continue Eliquis   - CBC today   3. Tobacco Abuse/emphysema - smoking 4 cigs/day, discussed cessation. - Encourage him to wear his O2 - Continue inhalers   4. CAD - s/p PCI to LAD 2020 - No chest pain - No ASA with AC - On Zetia  + statin - Per Gen Cards         Follow up  Donivin Wirt NP-C  8:31 AM

## 2024-04-23 ENCOUNTER — Telehealth (HOSPITAL_COMMUNITY): Payer: Self-pay

## 2024-04-23 NOTE — Telephone Encounter (Signed)
 Called to confirm/remind patient of their appointment at the [facility] on 04/24/2024 1:30.   Appointment:   [] Confirmed  [x] Left mess   [] No answer/No voice mail  [] VM Full/unable to leave message  [] Phone not in service  Patient reminded to bring all medications and/or complete list.  Confirmed patient has transportation. Gave directions, instructed to utilize valet parking.

## 2024-04-24 ENCOUNTER — Ambulatory Visit (HOSPITAL_COMMUNITY)

## 2024-04-24 NOTE — Progress Notes (Incomplete)
 HEART & VASCULAR TRANSITION OF CARE NOTE     PCP: Trinidad Glisson, MD  EP: Dr. Inocencio Primary Cardiologist: Dr. Edwyna  HPI:  Jimmy Nelson. is a 58 y.o. male history of CAD s/p anterolateral STEMI in August 2020 s/p PCI of LAD, chronic systolic heart failure, history of V-fib arrest s/p Abbott single-chamber ICD implant in December 2020, COPD w/ O2 dependence, CKD stage III, Crohn's disease s/p bowel resection surgeries in the past, cholelithiasis, seizure disorder, and tobacco use.  Admitted 4/25 with CP. Found to to have right PE, started on [facility]. Code blue called on 01/23/24 but patient never lost pulses, more nonresponsive event due to symptomatic bradycardia. Echo showed LVEF 20-25% (stable from 03/2021), RWMA, RV normal. PVCs noted on 01/25/24. GDMT titrated and he was discharged home, weight 147 lbs.  He is here today for CHF follow-up.  Today he presents to [facility] for post hospital follow up with his fiance. Overall feeling fair. He is SOB with ADLs, wears 2.5 L oxygen  at night, supposed to wear at all time. Has occasional positional dizziness, fiance says he is unsteady on his feet. Denies palpitations, abnormal bleeding,edema, or PND/Orthopnea. Appetite ok. Weight at home 145 pounds. Taking all medications.   Smokes 4 cigs/day, no ETOH, vapes THC. 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  Family Hx: no family hx Social Hx: lives with fiance, smokes 4 cigs/day, uses THC vape pen  Cardiac Testing  - Echo 4/25: EF 20-25%, RWMA, normal RV  - Echo 2/22: EF 20-25%  - LHC 8/20: s/p PIC LAD  - Echo 8/20: EF 25-30%, normal RV  Past Medical History:  Diagnosis Date   Acute anterolateral myocardial infarction (HCC) 05/05/2019   Acute MI, anterolateral wall, initial episode of care (HCC) 05/05/2019   S/P Prox LAD 3.5x20 and 3.0x12 Synergy 100% to 0% on 05/05/2019     Alcohol abuse 12/30/2012    Adequate for discharge     Amnesia    Anemia    Anxiety and depression    Chronic systolic heart failure (HCC) 04/14/2020   Congestive heart failure, hypertensive (HCC)    COPD (chronic obstructive pulmonary disease) (HCC)    Crohn's disease (HCC) Deteriorating disk   Crohn's disease (HCC)    Depression    Encounter for assessment of implantable cardioverter-defibrillator (ICD) 04/14/2020   Esophageal reflux 01/20/2013   GERD (gastroesophageal reflux disease)    History of lung cancer    History of MI (myocardial infarction) 05/05/2019   History of stroke    2   Hyperlipidemia    Hypertension    Hypogonadism in male    ICD - Single chamber Abbott Vascular GALLANT VR ICD 09/10/2019 09/10/2019   Kidney disease 01/21/2022   chronic   Lung cancer (HCC)    Major depressive disorder    Mental disorder    Neuropathy    Nonischemic cardiomyopathy (HCC) 08/22/2019   Oxygen  dependent    Rheumatoid arthritis of left wrist (HCC)    Seizure (HCC)    No recent seizures; continue Depakote    Sleep apnea    Stage 3 chronic kidney disease (HCC)    Stroke (HCC)    Tobacco use disorder, continuous 05/05/2019    Current Outpatient Medications  Medication Sig Dispense Refill   acetaminophen  (TYLENOL ) 500 MG tablet Take 1 tablet (500 mg total) by mouth every 6 (six) hours as needed for moderate pain (pain score  4-6) or mild pain (pain score 1-3). 60 tablet 0   Albuterol  Sulfate (PROAIR  RESPICLICK) 108 (90 Base) MCG/ACT AEPB Inhale 2 puffs into the lungs every 6 (six) hours as needed (shortness of breath and wheezing).     APIXABAN  (ELIQUIS ) VTE STARTER PACK (10MG  AND 5MG ) Take  2 tablets (10 mg) by mouth 2 (two) times daily for 7 days, THEN 1 tablet (5 mg) 2 (two) times daily. 74 tablet 0   aspirin  EC 81 MG tablet Take 162 mg by mouth in the morning. Swallow whole. (Patient not taking: Reported on 01/31/2024)     [Paused] atorvastatin  (LIPITOR ) 80 MG tablet Take 1 tablet (80 mg total) by mouth  daily. (Patient not taking: Reported on 01/31/2024) 90 tablet 2   Brexpiprazole (REXULTI) 0.5 MG TABS Take by mouth.     budeson-glycopyrrolate -formoterol  (BREZTRI  AEROSPHERE) 160-9-4.8 MCG/ACT AERO inhaler Inhale 2 puffs into the lungs in the morning and at bedtime. 10.7 g 0   Coenzyme Q10 (COQ10) 100 MG CAPS Take 100 mg by mouth in the morning.     divalproex  (DEPAKOTE ) 250 MG DR tablet Take 1 tablet (250 mg total) by mouth in the morning AND 2 tablets (500 mg total) at bedtime. 90 tablet 0   docusate sodium (COLACE) 100 MG capsule Take 200 mg by mouth in the morning.     EPINEPHrine 0.3 mg/0.3 mL IJ SOAJ injection Inject 0.3 mg into the muscle as needed for anaphylaxis.     ezetimibe  (ZETIA ) 10 MG tablet Take 1 tablet (10 mg total) by mouth daily. 90 tablet 2   famotidine  (PEPCID ) 20 MG tablet Take 1 tablet (20 mg total) by mouth at bedtime. 30 tablet 0   folic acid  (FOLVITE ) 1 MG tablet Take 1 tablet (1 mg total) by mouth daily. 30 tablet 0   gabapentin  (NEURONTIN ) 600 MG tablet Take 600 mg by mouth in the morning, at noon, and at bedtime.     ipratropium-albuterol  (DUONEB) 0.5-2.5 (3) MG/3ML SOLN Inhale 3 mLs into the lungs See admin instructions. Nebulize 3 ml's and inhale into the lungs every 4-6 hours or as needed for shortness of breath and/or wheezing     losartan  (COZAAR ) 25 MG tablet Take 0.5 tablets (12.5 mg total) by mouth daily. 45 tablet 3   magnesium  oxide (MAG-OX) 400 MG tablet Take 400 mg by mouth in the morning, at noon, and at bedtime.     metoprolol  succinate (TOPROL -XL) 25 MG 24 hr tablet Take 0.5 tablets (12.5 mg total) by mouth 2 (two) times daily. 30 tablet 0   Multiple Vitamin (MULTIVITAMIN WITH MINERALS) TABS tablet Take 1 tablet by mouth daily. 30 tablet 0   Multiple Vitamins-Minerals (CERTAVITE SENIOR/ANTIOXIDANT PO) Take by mouth.     nicotine  (NICODERM CQ  - DOSED IN MG/24 HOURS) 21 mg/24hr patch Place 21 mg onto the skin daily.     nitroGLYCERIN  (NITROSTAT ) 0.4 MG SL  tablet DISSOLVE 1 TABLET UNDER THE TONGUE EVERY 5 MINUTES AS NEEDED FOR CHEST PAIN. (MAXIMUM OF 3 DOSES) 25 tablet 3   ondansetron  (ZOFRAN -ODT) 4 MG disintegrating tablet Take 1 tablet (4 mg total) by mouth every 8 (eight) hours as needed for nausea or vomiting. 10 tablet 0   pantoprazole  (PROTONIX ) 40 MG tablet Take 1 tablet (40 mg total) by mouth 2 (two) times daily. Do not take at the same time as famotidine  60 tablet 0   pyridOXINE (VITAMIN B-6) 100 MG tablet Take 100 mg by mouth in the morning.  sertraline  (ZOLOFT ) 50 MG tablet Take 3 tablets (150 mg total) by mouth daily. 90 tablet 0   thiamine  (VITAMIN B1) 100 MG tablet Take 1 tablet (100 mg total) by mouth daily. 30 tablet 0   vitamin B-12 (CYANOCOBALAMIN) 500 MCG tablet Take 500 mcg by mouth in the morning.     Zinc 50 MG TABS Take 50 mg by mouth in the morning.     No current facility-administered medications for this visit.   Allergies  Allergen Reactions   Bee Venom Anaphylaxis   Iodinated Contrast Media Anaphylaxis and Other (See Comments)    It is noted that pt is allergic to Iodinated contrast media-iv dye,oral contrast   Chantix [Varenicline Tartrate] Other (See Comments)    Seizures   Social History   Socioeconomic History   Marital status: Significant Other    Spouse name: Not on file   Number of children: 5   Years of education: Not on file   Highest education level: 9th grade  Occupational History   Occupation: disability  Tobacco Use   Smoking status: Every Day    Types: Cigarettes   Smokeless tobacco: Current    Types: Snuff   Tobacco comments:    3 cigarettes daily   Vaping Use   Vaping status: Never Used  Substance and Sexual Activity   Alcohol use: Yes    Comment: Occasional   Drug use: Yes    Types: Cocaine, Marijuana    Comment: Former Cocaine user   Sexual activity: Yes  Other Topics Concern   Not on file  Social History Narrative   ** Merged History Encounter **       Social Drivers  of Health   Financial Resource Strain: Low Risk  (01/25/2024)   Overall Financial Resource Strain (CARDIA)    Difficulty of Paying Living Expenses: Not very hard  Food Insecurity: No Food Insecurity (01/23/2024)   Hunger Vital Sign    Worried About Running Out of Food in the Last Year: Never true    Ran Out of Food in the Last Year: Never true  Transportation Needs: No Transportation Needs (01/25/2024)   PRAPARE - Administrator, Civil Service (Medical): No    Lack of Transportation (Non-Medical): No  Physical Activity: Not on file  Stress: Not on file  Social Connections: Not on file  Intimate Partner Violence: Not At Risk (01/23/2024)   Humiliation, Afraid, Rape, and Kick questionnaire    Fear of Current or Ex-Partner: No    Emotionally Abused: No    Physically Abused: No    Sexually Abused: No   Family History  Problem Relation Age of Onset   Diabetes Brother    Drug abuse Daughter        heroin addict   Wt Readings from Last 3 Encounters:  01/31/24 66.5 kg (146 lb 9.6 oz)  01/26/24 66.7 kg (147 lb 1.6 oz)  11/11/23 65.9 kg (145 lb 3.2 oz)   There were no vitals taken for this visit.  PHYSICAL EXAM: General:  NAD. No resp difficulty, walked into clinic, thin HEENT: Normal Neck: Supple. No JVD. Cor: Regular rate & rhythm. No rubs, gallops or murmurs. Lungs: Clear, diminished in bases Abdomen: Soft, nontender, nondistended.  Extremities: No cyanosis, clubbing, rash, edema Neuro: Alert & oriented x 3, moves all 4 extremities w/o difficulty. Affect pleasant.  Device interrogation (personally reviewed):   ASSESSMENT & PLAN: Chronic Systolic Heart Failure - iCM - Echo 8/20: EF 25-30%, normal RV -  PCI to LAD 8/20 - Echo 2/22: EF 20-25% - Echo 4/25: EF 20-25%, RWMA, normal RV - NYHA III, suspect COPD main driver of symptoms. Volume looks low on exam and CorVue - Continue losartan  12.5 mg at bedtime, stopped Entresto  d/t hypotension - Continue Toprol  XL 12.5  mg bid - Hold off on SGLT2i and spiro  - Labs today - Not a candidate for advanced therapies with advanced COPD & cachexia  2. PE - Continue Eliquis   - CBC today  3. Tobacco Abuse/emphysema - smoking 4 cigs/day, discussed cessation. - Encourage him to wear his O2 - Continue inhalers  4. CAD - s/p PCI to LAD 2020 - No chest pain - No ASA with AC - On Zetia  + statin - Per Gen Cards    Referred to HFSW (PCP, Medications, Transportation, ETOH Abuse, Drug Abuse, Insurance, Financial ): No Refer to Pharmacy: No Refer to Home Health: No Refer to Advanced Heart Failure Clinic: No Refer to General Cardiology: No, established with Dr. Edwyna  Follow up   Manuelita Dutch, PA-C 04/24/24

## 2024-04-27 DIAGNOSIS — J449 Chronic obstructive pulmonary disease, unspecified: Secondary | ICD-10-CM | POA: Diagnosis not present

## 2024-05-01 ENCOUNTER — Encounter: Payer: Self-pay | Admitting: Pharmacist

## 2024-05-01 NOTE — Progress Notes (Signed)
 Pharmacy Quality Measure Review  This patient is appearing on a report for being at risk of failing the adherence measure for cholesterol (statin) medications this calendar year.   Medication: atorvastatin  80 mg Last fill date: 01/31/2024 for 30 day supply  Statin is currently on hold as of 01/26/2024 under direction of Cardiology due to elevated LFTs.    Annabella Galla, PharmD Clinical Pharmacist [place] Direct Dial: [phone]

## 2024-05-05 DIAGNOSIS — I5020 Unspecified systolic (congestive) heart failure: Secondary | ICD-10-CM | POA: Diagnosis not present

## 2024-05-05 DIAGNOSIS — R29898 Other symptoms and signs involving the musculoskeletal system: Secondary | ICD-10-CM | POA: Diagnosis not present

## 2024-05-28 DIAGNOSIS — I110 Hypertensive heart disease with heart failure: Secondary | ICD-10-CM | POA: Diagnosis not present

## 2024-05-28 DIAGNOSIS — J449 Chronic obstructive pulmonary disease, unspecified: Secondary | ICD-10-CM | POA: Diagnosis not present

## 2024-06-05 DIAGNOSIS — R29898 Other symptoms and signs involving the musculoskeletal system: Secondary | ICD-10-CM | POA: Diagnosis not present

## 2024-06-05 DIAGNOSIS — I5020 Unspecified systolic (congestive) heart failure: Secondary | ICD-10-CM | POA: Diagnosis not present

## 2024-06-05 DIAGNOSIS — I110 Hypertensive heart disease with heart failure: Secondary | ICD-10-CM | POA: Diagnosis not present

## 2024-06-13 DIAGNOSIS — E876 Hypokalemia: Secondary | ICD-10-CM | POA: Diagnosis not present

## 2024-06-13 DIAGNOSIS — Z13220 Encounter for screening for lipoid disorders: Secondary | ICD-10-CM | POA: Diagnosis not present

## 2024-06-13 DIAGNOSIS — Z23 Encounter for immunization: Secondary | ICD-10-CM | POA: Diagnosis not present

## 2024-06-27 DIAGNOSIS — I110 Hypertensive heart disease with heart failure: Secondary | ICD-10-CM | POA: Diagnosis not present

## 2024-06-27 DIAGNOSIS — J449 Chronic obstructive pulmonary disease, unspecified: Secondary | ICD-10-CM | POA: Diagnosis not present

## 2024-07-05 ENCOUNTER — Ambulatory Visit: Payer: 59

## 2024-07-05 DIAGNOSIS — I428 Other cardiomyopathies: Secondary | ICD-10-CM

## 2024-07-05 DIAGNOSIS — I5020 Unspecified systolic (congestive) heart failure: Secondary | ICD-10-CM | POA: Diagnosis not present

## 2024-07-05 DIAGNOSIS — R29898 Other symptoms and signs involving the musculoskeletal system: Secondary | ICD-10-CM | POA: Diagnosis not present

## 2024-07-05 DIAGNOSIS — I110 Hypertensive heart disease with heart failure: Secondary | ICD-10-CM | POA: Diagnosis not present

## 2024-07-05 LAB — CUP PACEART REMOTE DEVICE CHECK
Battery Remaining Longevity: 79 mo
Battery Remaining Percentage: 65 %
Battery Voltage: 2.96 V
Brady Statistic RV Percent Paced: 1 %
Date Time Interrogation Session: 20251009020158
HighPow Impedance: 72 Ohm
Implantable Lead Connection Status: 753985
Implantable Lead Implant Date: 20201214
Implantable Lead Location: 753860
Implantable Pulse Generator Implant Date: 20201214
Lead Channel Impedance Value: 680 Ohm
Lead Channel Pacing Threshold Amplitude: 0.75 V
Lead Channel Pacing Threshold Pulse Width: 0.5 ms
Lead Channel Sensing Intrinsic Amplitude: 12 mV
Lead Channel Setting Pacing Amplitude: 2.5 V
Lead Channel Setting Pacing Pulse Width: 0.5 ms
Lead Channel Setting Sensing Sensitivity: 0.5 mV
Pulse Gen Serial Number: 111013076
Zone Setting Status: 755011

## 2024-07-05 NOTE — Progress Notes (Signed)
 Remote ICD Transmission

## 2024-07-06 ENCOUNTER — Ambulatory Visit: Payer: Self-pay | Admitting: Cardiology

## 2024-07-09 NOTE — Progress Notes (Signed)
 Remote ICD Transmission

## 2024-07-18 DIAGNOSIS — M79605 Pain in left leg: Secondary | ICD-10-CM | POA: Diagnosis not present

## 2024-07-18 DIAGNOSIS — J441 Chronic obstructive pulmonary disease with (acute) exacerbation: Secondary | ICD-10-CM | POA: Diagnosis not present

## 2024-07-18 DIAGNOSIS — N182 Chronic kidney disease, stage 2 (mild): Secondary | ICD-10-CM | POA: Diagnosis not present

## 2024-07-18 DIAGNOSIS — Z6822 Body mass index (BMI) 22.0-22.9, adult: Secondary | ICD-10-CM | POA: Diagnosis not present

## 2024-07-18 DIAGNOSIS — M79604 Pain in right leg: Secondary | ICD-10-CM | POA: Diagnosis not present

## 2024-07-28 DIAGNOSIS — J449 Chronic obstructive pulmonary disease, unspecified: Secondary | ICD-10-CM | POA: Diagnosis not present

## 2024-07-28 DIAGNOSIS — I110 Hypertensive heart disease with heart failure: Secondary | ICD-10-CM | POA: Diagnosis not present

## 2024-09-04 ENCOUNTER — Telehealth (HOSPITAL_COMMUNITY): Payer: Self-pay

## 2024-09-04 NOTE — Telephone Encounter (Signed)
 Received a fax requesting medical records from [facility]. Records were successfully faxed to: [phone] ,which was the number provided.. Medical request form will be scanned into patients chart.

## 2024-09-04 NOTE — Telephone Encounter (Signed)
 ERROR

## 2024-10-04 ENCOUNTER — Ambulatory Visit: Payer: 59 | Attending: Cardiology

## 2024-10-04 DIAGNOSIS — I428 Other cardiomyopathies: Secondary | ICD-10-CM | POA: Diagnosis not present

## 2024-10-08 LAB — CUP PACEART REMOTE DEVICE CHECK
Battery Remaining Longevity: 78 mo
Battery Remaining Percentage: 64 %
Battery Voltage: 2.96 V
Brady Statistic RV Percent Paced: 1 %
Date Time Interrogation Session: 20260112033324
HighPow Impedance: 82 Ohm
Implantable Lead Connection Status: 753985
Implantable Lead Implant Date: 20201214
Implantable Lead Location: 753860
Implantable Pulse Generator Implant Date: 20201214
Lead Channel Impedance Value: 730 Ohm
Lead Channel Pacing Threshold Amplitude: 0.75 V
Lead Channel Pacing Threshold Pulse Width: 0.5 ms
Lead Channel Sensing Intrinsic Amplitude: 12 mV
Lead Channel Setting Pacing Amplitude: 2.5 V
Lead Channel Setting Pacing Pulse Width: 0.5 ms
Lead Channel Setting Sensing Sensitivity: 0.5 mV
Pulse Gen Serial Number: 111013076
Zone Setting Status: 755011

## 2024-10-09 ENCOUNTER — Ambulatory Visit: Payer: Self-pay | Admitting: Cardiology

## 2024-10-09 NOTE — Progress Notes (Signed)
 Remote ICD Transmission
